# Patient Record
Sex: Female | Born: 1950 | Race: Black or African American | Hispanic: No | Marital: Married | Smoking: Never smoker
Health system: Southern US, Community
[De-identification: ages and names within clinical notes are randomized; demographics above are authoritative.]

## PROBLEM LIST (undated history)

## (undated) DIAGNOSIS — E7800 Pure hypercholesterolemia, unspecified: Secondary | ICD-10-CM

## (undated) DIAGNOSIS — K8020 Calculus of gallbladder without cholecystitis without obstruction: Secondary | ICD-10-CM

## (undated) DIAGNOSIS — T8859XA Other complications of anesthesia, initial encounter: Secondary | ICD-10-CM

## (undated) DIAGNOSIS — R7303 Prediabetes: Secondary | ICD-10-CM

## (undated) DIAGNOSIS — H04123 Dry eye syndrome of bilateral lacrimal glands: Secondary | ICD-10-CM

## (undated) DIAGNOSIS — T4145XA Adverse effect of unspecified anesthetic, initial encounter: Secondary | ICD-10-CM

## (undated) DIAGNOSIS — K219 Gastro-esophageal reflux disease without esophagitis: Secondary | ICD-10-CM

## (undated) DIAGNOSIS — R030 Elevated blood-pressure reading, without diagnosis of hypertension: Secondary | ICD-10-CM

## (undated) DIAGNOSIS — D649 Anemia, unspecified: Secondary | ICD-10-CM

## (undated) DIAGNOSIS — Z972 Presence of dental prosthetic device (complete) (partial): Secondary | ICD-10-CM

## (undated) DIAGNOSIS — T7840XA Allergy, unspecified, initial encounter: Secondary | ICD-10-CM

## (undated) DIAGNOSIS — M1990 Unspecified osteoarthritis, unspecified site: Secondary | ICD-10-CM

## (undated) HISTORY — DX: Unspecified osteoarthritis, unspecified site: M19.90

## (undated) HISTORY — DX: Pure hypercholesterolemia, unspecified: E78.00

## (undated) HISTORY — DX: Gastro-esophageal reflux disease without esophagitis: K21.9

## (undated) HISTORY — DX: Prediabetes: R73.03

## (undated) HISTORY — PX: KNEE SURGERY: SHX244

## (undated) HISTORY — DX: Allergy, unspecified, initial encounter: T78.40XA

## (undated) HISTORY — DX: Dry eye syndrome of bilateral lacrimal glands: H04.123

## (undated) HISTORY — PX: ABDOMINAL HYSTERECTOMY: SHX81

## (undated) HISTORY — DX: Calculus of gallbladder without cholecystitis without obstruction: K80.20

## (undated) HISTORY — DX: Elevated blood-pressure reading, without diagnosis of hypertension: R03.0

---

## 2003-05-13 LAB — HM PAP SMEAR

## 2004-12-18 ENCOUNTER — Emergency Department: Payer: Self-pay | Admitting: Emergency Medicine

## 2006-03-04 LAB — HM DEXA SCAN

## 2008-06-07 ENCOUNTER — Ambulatory Visit: Payer: Self-pay | Admitting: General Practice

## 2008-06-28 ENCOUNTER — Ambulatory Visit: Payer: Self-pay | Admitting: General Practice

## 2009-10-14 ENCOUNTER — Emergency Department: Payer: Self-pay | Admitting: Emergency Medicine

## 2009-12-23 ENCOUNTER — Ambulatory Visit: Payer: Self-pay | Admitting: General Practice

## 2014-11-02 DIAGNOSIS — E7800 Pure hypercholesterolemia, unspecified: Secondary | ICD-10-CM | POA: Insufficient documentation

## 2014-11-02 DIAGNOSIS — R7303 Prediabetes: Secondary | ICD-10-CM | POA: Insufficient documentation

## 2015-07-18 ENCOUNTER — Ambulatory Visit (INDEPENDENT_AMBULATORY_CARE_PROVIDER_SITE_OTHER): Payer: Commercial Managed Care - HMO | Admitting: Family Medicine

## 2015-07-18 ENCOUNTER — Encounter: Payer: Self-pay | Admitting: Family Medicine

## 2015-07-18 VITALS — BP 129/87 | HR 75 | Temp 98.2°F | Ht 66.0 in | Wt 176.0 lb

## 2015-07-18 DIAGNOSIS — R030 Elevated blood-pressure reading, without diagnosis of hypertension: Secondary | ICD-10-CM

## 2015-07-18 DIAGNOSIS — M170 Bilateral primary osteoarthritis of knee: Secondary | ICD-10-CM | POA: Diagnosis not present

## 2015-07-18 DIAGNOSIS — E7800 Pure hypercholesterolemia, unspecified: Secondary | ICD-10-CM

## 2015-07-18 DIAGNOSIS — IMO0001 Reserved for inherently not codable concepts without codable children: Secondary | ICD-10-CM

## 2015-07-18 DIAGNOSIS — M1710 Unilateral primary osteoarthritis, unspecified knee: Secondary | ICD-10-CM | POA: Insufficient documentation

## 2015-07-18 DIAGNOSIS — M179 Osteoarthritis of knee, unspecified: Secondary | ICD-10-CM | POA: Insufficient documentation

## 2015-07-18 DIAGNOSIS — R7303 Prediabetes: Secondary | ICD-10-CM

## 2015-07-18 MED ORDER — AMOXICILLIN 875 MG PO TABS: 875.0000 mg | ORAL_TABLET | Freq: Two times a day (BID) | ORAL | Status: DC

## 2015-07-18 NOTE — Patient Instructions (Addendum)
Your goal blood pressure is less than 150 mmHg on top. Try to follow the DASH guidelines (DASH stands for Dietary Approaches to Stop Hypertension) Try to limit the sodium in your diet.  Ideally, consume less than 1.5 grams (less than 1,500mg ) per day. Do not add salt when cooking or at the table.  Check the sodium amount on labels when shopping, and choose items lower in sodium when given a choice. Avoid or limit foods that already contain a lot of sodium. Eat a diet rich in fruits and vegetables and whole grains.  Limit egg yolks to three per week maximum Try to limit saturated fats in your diet (bologna, hot dogs, barbeque, cheeseburgers, hamburgers, steak, bacon, sausage, cheese, etc.) and get more fresh fruits, vegetables, and whole grains  Try turmeric as a natural anti-inflammatory (for pain and arthritis). It comes in capsules where you buy aspirin and fish oil, but also as a spice where you buy pepper and garlic powder. Consider scrambled tofu instead of eggs and turmeric gives it a nice yellow color  Return for fasting labs in the next week or two; you can also return for a Medicare Wellness visit at your convenience  I've put in a referral for you to meet with an orthopaedist about your knee; they may be able to give you injections to postpone surgery in the future If you have not heard anything from my staff in a week about any orders/referrals/studies from today, please contact us here to follow-up [phone]  Start new antibiotics Please do eat yogurt daily or take a probiotic daily for the next month or two We want to replace the healthy germs in the gut If you notice foul, watery diarrhea in the next two months, schedule an appointment RIGHT AWAY   [place] stands for "Dietary Approaches to Stop Hypertension." The DASH eating plan is a healthy eating plan that has been shown to reduce high blood pressure (hypertension). Additional health benefits may include  reducing the risk of type 2 diabetes mellitus, heart disease, and stroke. The DASH eating plan may also help with weight loss. WHAT DO I NEED TO KNOW ABOUT THE DASH EATING PLAN? For the DASH eating plan, you will follow these general guidelines:  Choose foods with a percent daily value for sodium of less than 5% (as listed on the food label).  Use salt-free seasonings or herbs instead of table salt or sea salt.  Check with your health care provider or pharmacist before using salt substitutes.  Eat lower-sodium products, often labeled as "lower sodium" or "no salt added."  Eat fresh foods.  Eat more vegetables, fruits, and low-fat dairy products.  Choose whole grains. Look for the word "whole" as the first word in the ingredient list.  Choose fish and skinless chicken or [place] more often than red meat. Limit fish, poultry, and meat to 6 oz (170 g) each day.  Limit sweets, desserts, sugars, and sugary drinks.  Choose heart-healthy fats.  Limit cheese to 1 oz (28 g) per day.  Eat more home-cooked food and less restaurant, buffet, and fast food.  Limit fried foods.  Cook foods using methods other than frying.  Limit canned vegetables. If you do use them, rinse them well to decrease the sodium.  When eating at a restaurant, ask that your food be prepared with less salt, or no salt if possible. WHAT FOODS CAN I EAT? Seek help from a dietitian for individual calorie needs. Grains Whole grain  or whole wheat bread. Brown rice. Whole grain or whole wheat pasta. Quinoa, bulgur, and whole grain cereals. Low-sodium cereals. Corn or whole wheat flour tortillas. Whole grain cornbread. Whole grain crackers. Low-sodium crackers. Vegetables Fresh or frozen vegetables (raw, steamed, roasted, or grilled). Low-sodium or reduced-sodium tomato and vegetable juices. Low-sodium or reduced-sodium tomato sauce and paste. Low-sodium or reduced-sodium canned vegetables.  Fruits All fresh, canned (in  natural juice), or frozen fruits. Meat and Other Protein Products Ground beef (85% or leaner), grass-fed beef, or beef trimmed of fat. Skinless chicken or [place]. Ground chicken or [place]. Pork trimmed of fat. All fish and seafood. Eggs. Dried beans, peas, or lentils. Unsalted nuts and seeds. Unsalted canned beans. Dairy Low-fat dairy products, such as skim or 1% milk, 2% or reduced-fat cheeses, low-fat ricotta or cottage cheese, or plain low-fat yogurt. Low-sodium or reduced-sodium cheeses. Fats and Oils Tub margarines without trans fats. Light or reduced-fat mayonnaise and salad dressings (reduced sodium). Avocado. Safflower, olive, or canola oils. Natural peanut or almond butter. Other Unsalted popcorn and pretzels. The items listed above may not be a complete list of recommended foods or beverages. Contact your dietitian for more options. WHAT FOODS ARE NOT RECOMMENDED? Grains White bread. White pasta. White rice. Refined cornbread. Bagels and croissants. Crackers that contain trans fat. Vegetables Creamed or fried vegetables. Vegetables in a cheese sauce. Regular canned vegetables. Regular canned tomato sauce and paste. Regular tomato and vegetable juices. Fruits Dried fruits. Canned fruit in light or heavy syrup. Fruit juice. Meat and Other Protein Products Fatty cuts of meat. Ribs, chicken wings, bacon, sausage, bologna, salami, chitterlings, fatback, hot dogs, bratwurst, and packaged luncheon meats. Salted nuts and seeds. Canned beans with salt. Dairy Whole or 2% milk, cream, half-and-half, and cream cheese. Whole-fat or sweetened yogurt. Full-fat cheeses or blue cheese. Nondairy creamers and whipped toppings. Processed cheese, cheese spreads, or cheese curds. Condiments Onion and garlic salt, seasoned salt, table salt, and sea salt. Canned and packaged gravies. Worcestershire sauce. Tartar sauce. Barbecue sauce. Teriyaki sauce. Soy sauce, including reduced sodium. Steak sauce. Fish  sauce. Oyster sauce. Cocktail sauce. Horseradish. Ketchup and mustard. Meat flavorings and tenderizers. Bouillon cubes. Hot sauce. Tabasco sauce. Marinades. Taco seasonings. Relishes. Fats and Oils Butter, stick margarine, lard, shortening, ghee, and bacon fat. Coconut, palm kernel, or palm oils. Regular salad dressings. Other Pickles and olives. Salted popcorn and pretzels. The items listed above may not be a complete list of foods and beverages to avoid. Contact your dietitian for more information. WHERE CAN I FIND MORE INFORMATION? National Heart, [employer], and [employer] Institute: travelstabloid.com   This information is not intended to replace advice given to you by your health care provider. Make sure you discuss any questions you have with your health care provider.   Document Released: 04/11/2011 Document Revised: 05/13/2014 Document Reviewed: 02/24/2013 Elsevier Interactive Patient Education [employer].
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
## 2015-07-25 ENCOUNTER — Other Ambulatory Visit: Payer: Commercial Managed Care - HMO

## 2015-07-25 DIAGNOSIS — Z114 Encounter for screening for human immunodeficiency virus [HIV]: Secondary | ICD-10-CM

## 2015-07-25 DIAGNOSIS — R7303 Prediabetes: Secondary | ICD-10-CM | POA: Diagnosis not present

## 2015-07-25 DIAGNOSIS — Z1159 Encounter for screening for other viral diseases: Secondary | ICD-10-CM

## 2015-07-25 DIAGNOSIS — E7800 Pure hypercholesterolemia, unspecified: Secondary | ICD-10-CM | POA: Diagnosis not present

## 2015-07-25 DIAGNOSIS — E538 Deficiency of other specified B group vitamins: Secondary | ICD-10-CM | POA: Diagnosis not present

## 2015-07-25 NOTE — Assessment & Plan Note (Signed)
Labs

## 2015-07-25 NOTE — Assessment & Plan Note (Signed)
labs

## 2015-07-28 ENCOUNTER — Telehealth: Payer: Self-pay | Admitting: Family Medicine

## 2015-07-28 LAB — COMPREHENSIVE METABOLIC PANEL
ALK PHOS: 80 IU/L (ref 39–117)
ALT: 22 IU/L (ref 0–32)
AST: 31 IU/L (ref 0–40)
Albumin/Globulin Ratio: 1.4 (ref 1.2–2.2)
Albumin: 4.1 g/dL (ref 3.6–4.8)
BUN / CREAT RATIO: 19 (ref 11–26)
BUN: 14 mg/dL (ref 8–27)
CHLORIDE: 104 mmol/L (ref 96–106)
CO2: 24 mmol/L (ref 18–29)
CREATININE: 0.72 mg/dL (ref 0.57–1.00)
Calcium: 9.2 mg/dL (ref 8.7–10.3)
GFR calc Af Amer: 102 mL/min/{1.73_m2} (ref >59)
GFR calc non Af Amer: 88 mL/min/{1.73_m2} (ref >59)
GLUCOSE: 98 mg/dL (ref 65–99)
Globulin, Total: 2.9 g/dL (ref 1.5–4.5)
Potassium: 4.6 mmol/L (ref 3.5–5.2)
Sodium: 144 mmol/L (ref 134–144)
Total Protein: 7 g/dL (ref 6.0–8.5)

## 2015-07-28 LAB — CBC WITH DIFFERENTIAL/PLATELET
Basophils Absolute: 0 10*3/uL (ref 0.0–0.2)
Basos: 1 %
EOS (ABSOLUTE): 0 10*3/uL (ref 0.0–0.4)
EOS: 0 %
HEMATOCRIT: 41.4 % (ref 34.0–46.6)
HEMOGLOBIN: 13.8 g/dL (ref 11.1–15.9)
Immature Grans (Abs): 0 10*3/uL (ref 0.0–0.1)
Immature Granulocytes: 0 %
LYMPHS ABS: 0.8 10*3/uL (ref 0.7–3.1)
Lymphs: 24 %
MCH: 28.6 pg (ref 26.6–33.0)
MCHC: 33.3 g/dL (ref 31.5–35.7)
MCV: 86 fL (ref 79–97)
MONOS ABS: 0.7 10*3/uL (ref 0.1–0.9)
Monocytes: 21 %
NEUTROS ABS: 1.8 10*3/uL (ref 1.4–7.0)
NEUTROS PCT: 54 %
Platelets: 190 10*3/uL (ref 150–379)
RBC: 4.83 x10E6/uL (ref 3.77–5.28)
RDW: 13.9 % (ref 12.3–15.4)
WBC: 3.4 10*3/uL (ref 3.4–10.8)

## 2015-07-28 LAB — HEPATITIS C ANTIBODY: HCV AB: 0.1 {s_co_ratio} (ref 0.0–0.9)

## 2015-07-28 LAB — LIPID PANEL W/O CHOL/HDL RATIO
Cholesterol, Total: 227 mg/dL — ABNORMAL HIGH (ref 100–199)
HDL: 88 mg/dL (ref >39)
LDL CALC: 132 mg/dL — AB (ref 0–99)
Triglycerides: 37 mg/dL (ref 0–149)
VLDL CHOLESTEROL CAL: 7 mg/dL (ref 5–40)

## 2015-07-28 LAB — HIV ANTIBODY (ROUTINE TESTING W REFLEX): HIV SCREEN 4TH GENERATION: NONREACTIVE

## 2015-07-28 LAB — VITAMIN B12: VITAMIN B 12: 1329 pg/mL — AB (ref 211–946)

## 2015-07-28 MED ORDER — AMOXICILLIN-POT CLAVULANATE 875-125 MG PO TABS: 1.0000 | ORAL_TABLET | Freq: Two times a day (BID) | ORAL | Status: DC
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
## 2015-07-28 NOTE — Telephone Encounter (Signed)
Routing to provider. Is it ok if I call it in to her pharmacy?

## 2015-08-01 DIAGNOSIS — R030 Elevated blood-pressure reading, without diagnosis of hypertension: Secondary | ICD-10-CM

## 2015-08-01 DIAGNOSIS — IMO0001 Reserved for inherently not codable concepts without codable children: Secondary | ICD-10-CM | POA: Insufficient documentation

## 2015-08-01 NOTE — Assessment & Plan Note (Signed)
Return for fasting labs to include cholesterol panel; limit saturated fats

## 2015-08-01 NOTE — Assessment & Plan Note (Signed)
Check labs, work on healthy eating, weight management

## 2015-08-01 NOTE — Assessment & Plan Note (Signed)
Noted at check-in; recheck was normal; recommended DASH guidelines; cut back on sodium in particular

## 2015-08-01 NOTE — Assessment & Plan Note (Signed)
Refer to orthopaedist for evaluation; right worse than left

## 2015-10-09 ENCOUNTER — Emergency Department: Payer: Commercial Managed Care - HMO

## 2015-10-09 ENCOUNTER — Emergency Department
Admission: EM | Admit: 2015-10-09 | Discharge: 2015-10-09 | Disposition: A | Discharge status: Home / Self Care | Payer: Commercial Managed Care - HMO | Attending: Emergency Medicine | Admitting: Emergency Medicine

## 2015-10-09 ENCOUNTER — Encounter: Payer: Self-pay | Admitting: Emergency Medicine

## 2015-10-09 DIAGNOSIS — R079 Chest pain, unspecified: Secondary | ICD-10-CM | POA: Diagnosis not present

## 2015-10-09 DIAGNOSIS — M179 Osteoarthritis of knee, unspecified: Secondary | ICD-10-CM | POA: Diagnosis not present

## 2015-10-09 DIAGNOSIS — K219 Gastro-esophageal reflux disease without esophagitis: Secondary | ICD-10-CM | POA: Diagnosis not present

## 2015-10-09 LAB — CBC
HEMATOCRIT: 39.2 % (ref 35.0–47.0)
HEMOGLOBIN: 12.9 g/dL (ref 12.0–16.0)
MCH: 28.1 pg (ref 26.0–34.0)
MCHC: 32.8 g/dL (ref 32.0–36.0)
MCV: 85.5 fL (ref 80.0–100.0)
Platelets: 188 10*3/uL (ref 150–440)
RBC: 4.58 MIL/uL (ref 3.80–5.20)
RDW: 13.7 % (ref 11.5–14.5)
WBC: 6 10*3/uL (ref 3.6–11.0)

## 2015-10-09 LAB — BASIC METABOLIC PANEL
ANION GAP: 9 (ref 5–15)
BUN: 12 mg/dL (ref 6–20)
CALCIUM: 9 mg/dL (ref 8.9–10.3)
CO2: 25 mmol/L (ref 22–32)
Chloride: 103 mmol/L (ref 101–111)
Creatinine, Ser: 0.65 mg/dL (ref 0.44–1.00)
GFR calc Af Amer: >60 mL/min (ref >60)
GLUCOSE: 145 mg/dL — AB (ref 65–99)
POTASSIUM: 3.6 mmol/L (ref 3.5–5.1)
SODIUM: 137 mmol/L (ref 135–145)

## 2015-10-09 LAB — TROPONIN I

## 2015-10-09 MED ORDER — FAMOTIDINE 20 MG PO TABS: 20.0000 mg | ORAL_TABLET | Freq: Two times a day (BID) | ORAL | Status: DC

## 2015-10-09 MED ORDER — FAMOTIDINE 20 MG PO TABS
20.0000 mg | ORAL_TABLET | Freq: Once | ORAL | Status: AC
  Administered 2015-10-09: 20 mg via ORAL
  Filled 2015-10-09: qty 1

## 2015-10-09 MED ORDER — GI COCKTAIL ~~LOC~~
30.0000 mL | Freq: Once | ORAL | Status: AC
  Administered 2015-10-09: 30 mL via ORAL
  Filled 2015-10-09: qty 30

## 2015-10-09 NOTE — Discharge Instructions (Signed)
Gastroesophageal Reflux Disease, Adult Normally, food travels down the esophagus and stays in the stomach to be digested. However, when a person has gastroesophageal reflux disease (GERD), food and stomach acid move back up into the esophagus. When this happens, the esophagus becomes sore and inflamed. Over time, GERD can create small holes (ulcers) in the lining of the esophagus.  CAUSES This condition is caused by a problem with the muscle between the esophagus and the stomach (lower esophageal sphincter, or LES). Normally, the LES muscle closes after food passes through the esophagus to the stomach. When the LES is weakened or abnormal, it does not close properly, and that allows food and stomach acid to go back up into the esophagus. The LES can be weakened by certain dietary substances, medicines, and medical conditions, including:  Tobacco use.  Pregnancy.  Having a hiatal hernia.  Heavy alcohol use.  Certain foods and beverages, such as coffee, chocolate, onions, and peppermint. RISK FACTORS This condition is more likely to develop in:  People who have an increased body weight.  People who have connective tissue disorders.  People who use NSAID medicines. SYMPTOMS Symptoms of this condition include:  Heartburn.  Difficult or painful swallowing.  The feeling of having a lump in the throat.  Abitter taste in the mouth.  Bad breath.  Having a large amount of saliva.  Having an upset or bloated stomach.  Belching.  Chest pain.  Shortness of breath or wheezing.  Ongoing (chronic) cough or a night-time cough.  Wearing away of tooth enamel.  Weight loss. Different conditions can cause chest pain. Make sure to see your health care provider if you experience chest pain. DIAGNOSIS Your health care provider will take a medical history and perform a physical exam. To determine if you have mild or severe GERD, your health care provider may also monitor how you respond  to treatment. You may also have other tests, including:  An endoscopy toexamine your stomach and esophagus with a small camera.  A test thatmeasures the acidity level in your esophagus.  A test thatmeasures how much pressure is on your esophagus.  A barium swallow or modified barium swallow to show the shape, size, and functioning of your esophagus. TREATMENT The goal of treatment is to help relieve your symptoms and to prevent complications. Treatment for this condition may vary depending on how severe your symptoms are. Your health care provider may recommend:  Changes to your diet.  Medicine.  Surgery. HOME CARE INSTRUCTIONS Diet  Follow a diet as recommended by your health care provider. This may involve avoiding foods and drinks such as:  Coffee and tea (with or without caffeine).  Drinks that containalcohol.  Energy drinks and sports drinks.  Carbonated drinks or sodas.  Chocolate and cocoa.  Peppermint and mint flavorings.  Garlic and onions.  Horseradish.  Spicy and acidic foods, including peppers, chili powder, curry powder, vinegar, hot sauces, and barbecue sauce.  Citrus fruit juices and citrus fruits, such as oranges, lemons, and limes.  Tomato-based foods, such as red sauce, chili, salsa, and pizza with red sauce.  Fried and fatty foods, such as donuts, french fries, potato chips, and high-fat dressings.  High-fat meats, such as hot dogs and fatty cuts of red and white meats, such as rib eye steak, sausage, ham, and bacon.  High-fat dairy items, such as whole milk, butter, and cream cheese.  Eat small, frequent meals instead of large meals.  Avoid drinking large amounts of liquid with your  meals. °· Avoid eating meals during the 2-3 hours before bedtime. °· Avoid lying down right after you eat. °· Do not exercise right after you eat. ° General Instructions  °· Pay attention to any changes in your symptoms. °· Take over-the-counter and prescription  medicines only as told by your health care provider. Do not take aspirin, ibuprofen, or other NSAIDs unless your health care provider told you to do so. °· Do not use any tobacco products, including cigarettes, chewing tobacco, and e-cigarettes. If you need help quitting, ask your health care provider. °· Wear loose-fitting clothing. Do not wear anything tight around your waist that causes pressure on your abdomen. °· Raise (elevate) the head of your bed 6 inches (15cm). °· Try to reduce your stress, such as with yoga or meditation. If you need help reducing stress, ask your health care provider. °· If you are overweight, reduce your weight to an amount that is healthy for you. Ask your health care provider for guidance about a safe weight loss goal. °· Keep all follow-up visits as told by your health care provider. This is important. °SEEK MEDICAL CARE IF: °· You have new symptoms. °· You have unexplained weight loss. °· You have difficulty swallowing, or it hurts to swallow. °· You have wheezing or a persistent cough. °· Your symptoms do not improve with treatment. °· You have a hoarse voice. °SEEK IMMEDIATE MEDICAL CARE IF: °· You have pain in your arms, neck, jaw, teeth, or back. °· You feel sweaty, dizzy, or light-headed. °· You have chest pain or shortness of breath. °· You vomit and your vomit looks like blood or coffee grounds. °· You faint. °· Your stool is bloody or black. °· You cannot swallow, drink, or eat. °  °This information is not intended to replace advice given to you by your health care provider. Make sure you discuss any questions you have with your health care provider. °  °Document Released: 01/30/2005 Document Revised: 01/11/2015 Document Reviewed: 08/17/2014 °Elsevier Interactive Patient Education ©2016 [employer] °Nonspecific Chest Pain  °Chest pain can be caused by many different conditions. There is always a chance that your pain could be related to something serious, such as a heart  attack or a blood clot in your lungs. Chest pain can also be caused by conditions that are not life-threatening. If you have chest pain, it is very important to follow up with your health care provider. °CAUSES  °Chest pain can be caused by: °· Heartburn. °· Pneumonia or bronchitis. °· Anxiety or stress. °· Inflammation around your heart (pericarditis) or lung (pleuritis or pleurisy). °· A blood clot in your lung. °· A collapsed lung (pneumothorax). It can develop suddenly on its own (spontaneous pneumothorax) or from trauma to the chest. °· Shingles infection (varicella-zoster virus). °· Heart attack. °· Damage to the bones, muscles, and cartilage that make up your chest wall. This can include: °· Bruised bones due to injury. °· Strained muscles or cartilage due to frequent or repeated coughing or overwork. °· Fracture to one or more ribs. °· Sore cartilage due to inflammation (costochondritis). °RISK FACTORS  °Risk factors for chest pain may include: °· Activities that increase your risk for trauma or injury to your chest. °· Respiratory infections or conditions that cause frequent coughing. °· Medical conditions or overeating that can cause heartburn. °· Heart disease or family history of heart disease. °· Conditions or health behaviors that increase your risk of developing a blood clot. °· Having had chicken pox (  varicella zoster). °SIGNS AND SYMPTOMS °Chest pain can feel like: °· Burning or tingling on the surface of your chest or deep in your chest. °· Crushing, pressure, aching, or squeezing pain. °· Dull or sharp pain that is worse when you move, cough, or take a deep breath. °· Pain that is also felt in your back, neck, shoulder, or arm, or pain that spreads to any of these areas. °Your chest pain may come and go, or it may stay constant. °DIAGNOSIS °Lab tests or other studies may be needed to find the cause of your pain. Your health care provider may have you take a test called an ambulatory ECG  (electrocardiogram). An ECG records your heartbeat patterns at the time the test is performed. You may also have other tests, such as: °· Transthoracic echocardiogram (TTE). During echocardiography, sound waves are used to create a picture of all of the heart structures and to look at how blood flows through your heart. °· Transesophageal echocardiogram (TEE). This is a more advanced imaging test that obtains images from inside your body. It allows your health care provider to see your heart in finer detail. °· Cardiac monitoring. This allows your health care provider to monitor your heart rate and rhythm in real time. °· Holter monitor. This is a portable device that records your heartbeat and can help to diagnose abnormal heartbeats. It allows your health care provider to track your heart activity for several days, if needed. °· Stress tests. These can be done through exercise or by taking medicine that makes your heart beat more quickly. °· Blood tests. °· Imaging tests. °TREATMENT  °Your treatment depends on what is causing your chest pain. Treatment may include: °· Medicines. These may include: °· Acid blockers for heartburn. °· Anti-inflammatory medicine. °· Pain medicine for inflammatory conditions. °· Antibiotic medicine, if an infection is present. °· Medicines to dissolve blood clots. °· Medicines to treat coronary artery disease. °· Supportive care for conditions that do not require medicines. This may include: °· Resting. °· Applying heat or cold packs to injured areas. °· Limiting activities until pain decreases. °HOME CARE INSTRUCTIONS °· If you were prescribed an antibiotic medicine, finish it all even if you start to feel better. °· Avoid any activities that bring on chest pain. °· Do not use any tobacco products, including cigarettes, chewing tobacco, or electronic cigarettes. If you need help quitting, ask your health care provider. °· Do not drink alcohol. °· Take medicines only as directed by  your health care provider. °· Keep all follow-up visits as directed by your health care provider. This is important. This includes any further testing if your chest pain does not go away. °· If heartburn is the cause for your chest pain, you may be told to keep your head raised (elevated) while sleeping. This reduces the chance that acid will go from your stomach into your esophagus. °· Make lifestyle changes as directed by your health care provider. These may include: °¨ Getting regular exercise. Ask your health care provider to suggest some activities that are safe for you. °¨ Eating a heart-healthy diet. A registered dietitian can help you to learn healthy eating options. °¨ Maintaining a healthy weight. °¨ Managing diabetes, if necessary. °¨ Reducing stress. °SEEK MEDICAL CARE IF: °· Your chest pain does not go away after treatment. °· You have a rash with blisters on your chest. °· You have a fever. °SEEK IMMEDIATE MEDICAL CARE IF:  °· Your chest pain is worse. °· You   have an increasing cough, or you cough up blood. °· You have severe abdominal pain. °· You have severe weakness. °· You faint. °· You have chills. °· You have sudden, unexplained chest discomfort. °· You have sudden, unexplained discomfort in your arms, back, neck, or jaw. °· You have shortness of breath at any time. °· You suddenly start to sweat, or your skin gets clammy. °· You feel nauseous or you vomit. °· You suddenly feel light-headed or dizzy. °· Your heart begins to beat quickly, or it feels like it is skipping beats. °These symptoms may represent a serious problem that is an emergency. Do not wait to see if the symptoms will go away. Get medical help right away. Call your local emergency services (911 in the [place] Do not drive yourself to the hospital. °  °This information is not intended to replace advice given to you by your health care provider. Make sure you discuss any questions you have with your health care provider. °  °Document  Released: 01/30/2005 Document Revised: 05/13/2014 Document Reviewed: 11/26/2013 °Elsevier Interactive Patient Education ©2016 [employer]vier Inc. ° °

## 2015-10-09 NOTE — ED Provider Notes (Addendum)
[facility] Emergency Department Provider Note        Time seen: ----------------------------------------- 9:54 PM on 10/09/2015 -----------------------------------------    I have reviewed the triage vital signs and the nursing notes.   HISTORY  Chief Complaint Chest Pain    HPI Ebony [name] is a 65 y.o. female who presents to ER with upper chest pain radiating into her throat accompanied by nausea.Patient states she thought it was acid indigestion, symptoms started about 5:00. She states she still has some soreness, had some sweats with it as well. Currently the symptoms are mild, she denies any recent illness. She has never had this happen before.   Past Medical History  Diagnosis Date  . Hypercholesterolemia   . Borderline diabetes mellitus   . Arthritis     Patient Active Problem List   Diagnosis Date Noted  . Elevated blood pressure 08/01/2015  . Arthritis of knee, degenerative 07/18/2015  . Borderline diabetes mellitus 11/02/2014  . Pure hypercholesterolemia 11/02/2014    Past Surgical History  Procedure Laterality Date  . Abdominal hysterectomy      complete  . Knee surgery Bilateral     Allergies Celecoxib  Social History Social History  Substance Use Topics  . Smoking status: Never Smoker   . Smokeless tobacco: Never Used  . Alcohol Use: No    Review of Systems Constitutional: Negative for fever. Eyes: Negative for visual changes. ENT: Negative for sore throat. Cardiovascular: Positive for chest pain Respiratory: Negative for shortness of breath. Gastrointestinal: Negative for abdominal pain, positive for nausea Genitourinary: Negative for dysuria. Musculoskeletal: Negative for back pain. Skin: Negative for rash. Neurological: Negative for headaches, focal weakness or numbness.  10-point ROS otherwise negative.  ____________________________________________   PHYSICAL EXAM:  VITAL SIGNS: ED Triage Vitals   Enc Vitals Group     BP 10/09/15 2020 139/92 mmHg     Pulse Rate 10/09/15 2020 82     Resp 10/09/15 2020 20     Temp 10/09/15 2020 97.9 F (36.6 C)     Temp Source 10/09/15 2020 Oral     SpO2 10/09/15 2020 99 %     Weight 10/09/15 2020 170 lb (77.111 kg)     Height 10/09/15 2020 5\' 7"  (1.702 m)     Head Cir --      Peak Flow --      Pain Score 10/09/15 2019 7     Pain Loc --      Pain Edu? --      Excl. in [place]? --     Constitutional: Alert and oriented. Well appearing and in no distress. Eyes: Conjunctivae are normal. PERRL. Normal extraocular movements. ENT   Head: Normocephalic and atraumatic.   Nose: No congestion/rhinnorhea.   Mouth/Throat: Mucous membranes are moist.   Neck: No stridor. Cardiovascular: Normal rate, regular rhythm. No murmurs, rubs, or gallops. Respiratory: Normal respiratory effort without tachypnea nor retractions. Breath sounds are clear and equal bilaterally. No wheezes/rales/rhonchi. Gastrointestinal: Soft and nontender. Normal bowel sounds Musculoskeletal: Nontender with normal range of motion in all extremities. No lower extremity tenderness nor edema. Neurologic:  Normal speech and language. No gross focal neurologic deficits are appreciated.  Skin:  Skin is warm, dry and intact. No rash noted. Psychiatric: Mood and affect are normal. Speech and behavior are normal.  ____________________________________________  EKG: Interpreted by me.Normal sinus rhythm rate 84 bpm, normal PR interval, normal QRS, normal QT interval. Normal axis.  ____________________________________________  ED COURSE:  Pertinent labs &  imaging results that were available during my care of the patient were reviewed by me and considered in my medical decision making (see chart for details). Patient is in no acute distress, appears very well. I will check basic labs and possible repeat troponin. ____________________________________________    LABS (pertinent  positives/negatives)  Labs Reviewed  BASIC METABOLIC PANEL - Abnormal; Notable for the following:    Glucose, Bld 145 (*)    All other components within normal limits  CBC  TROPONIN I  TROPONIN I    RADIOLOGY Images were viewed by me  IMPRESSION: Mild opacity in the left lung base is favored to represent scar or atelectasis. Early infiltrate not completely excluded. Recommend follow-up to resolution.  ____________________________________________  FINAL ASSESSMENT AND PLAN  Chest pain  Plan: Patient with labs and imaging as dictated above. Patient was given a GI cocktail and Pepcid.Patient likely with some GERD symptoms. She'll be discharged with Pepcid and encouraged to have close follow-up with her doctor for reevaluation.   Earleen Newport, MD   Note: This dictation was prepared with Dragon dictation. Any transcriptional errors that result from this process are unintentional   Earleen Newport, MD 10/09/15 2308  Earleen Newport, MD 10/09/15 [phone]

## 2015-10-09 NOTE — ED Notes (Signed)
Pt to triage via w/c with no distress noted; pt reports mid upper CP radiating into throat accomp by nausea x 1-2 hrs; denies hx of same

## 2015-10-09 NOTE — ED Notes (Signed)
Discharge instructions reviewed with patient. Questions fielded by this RN. Patient verbalizes understanding of instructions. Patient discharged home in stable condition per Jimmye Norman MD . No acute distress noted at time of discharge.

## 2015-10-11 ENCOUNTER — Encounter: Payer: Self-pay | Admitting: Family Medicine

## 2015-10-11 ENCOUNTER — Other Ambulatory Visit: Payer: Self-pay | Admitting: Family Medicine

## 2015-10-11 ENCOUNTER — Ambulatory Visit (INDEPENDENT_AMBULATORY_CARE_PROVIDER_SITE_OTHER): Payer: Commercial Managed Care - HMO | Admitting: Family Medicine

## 2015-10-11 VITALS — BP 122/74 | HR 85 | Temp 97.7°F | Resp 14 | Wt 175.5 lb

## 2015-10-11 DIAGNOSIS — F4323 Adjustment disorder with mixed anxiety and depressed mood: Secondary | ICD-10-CM

## 2015-10-11 DIAGNOSIS — N390 Urinary tract infection, site not specified: Secondary | ICD-10-CM | POA: Diagnosis not present

## 2015-10-11 DIAGNOSIS — M170 Bilateral primary osteoarthritis of knee: Secondary | ICD-10-CM | POA: Diagnosis not present

## 2015-10-11 DIAGNOSIS — R12 Heartburn: Secondary | ICD-10-CM | POA: Insufficient documentation

## 2015-10-11 DIAGNOSIS — R8281 Pyuria: Secondary | ICD-10-CM

## 2015-10-11 DIAGNOSIS — R350 Frequency of micturition: Secondary | ICD-10-CM

## 2015-10-11 HISTORY — DX: Adjustment disorder with mixed anxiety and depressed mood: F43.23

## 2015-10-11 LAB — POCT URINALYSIS DIPSTICK
BILIRUBIN UA: NEGATIVE
GLUCOSE UA: NEGATIVE
Ketones, UA: NEGATIVE
NITRITE UA: NEGATIVE
Protein, UA: NEGATIVE
RBC UA: NEGATIVE
Spec Grav, UA: 1.015
Urobilinogen, UA: 0.2
pH, UA: 5.5

## 2015-10-11 MED ORDER — NITROFURANTOIN MONOHYD MACRO 100 MG PO CAPS: 100.0000 mg | ORAL_CAPSULE | Freq: Two times a day (BID) | ORAL | Status: AC

## 2015-10-11 MED ORDER — ESCITALOPRAM OXALATE 10 MG PO TABS: 10.0000 mg | ORAL_TABLET | Freq: Every day | ORAL | Status: DC

## 2015-10-11 NOTE — Patient Instructions (Addendum)
Continue the famotidine (Pepcid) Try to limit triggers foods and drinks for acid: tomato-based sauces, orange juice, peppermint, coffee, onions, peppers, soft drinks Start escitalopram daily for stress Try to drink 64 ounces of water or decaf beverages a day If you get worse, call me or seek medical attention Please do eat yogurt daily or take a probiotic daily for the next month or two We want to replace the healthy germs in the gut If you notice foul, watery diarrhea in the next two months, schedule an appointment RIGHT AWAY
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
## 2015-10-11 NOTE — Assessment & Plan Note (Signed)
She is taking curcumin

## 2015-10-11 NOTE — Assessment & Plan Note (Signed)
Start escitalopram; discussed stress, prayer given; will see her back in 4 weeks

## 2015-10-11 NOTE — Assessment & Plan Note (Signed)
Continue pepcid; avoid/limit trigger foods; work on stress reduction

## 2015-10-13 LAB — PLEASE NOTE

## 2015-10-13 LAB — URINE CULTURE: ORGANISM ID, BACTERIA: NO GROWTH

## 2016-10-21 ENCOUNTER — Ambulatory Visit: Payer: Commercial Managed Care - HMO | Admitting: Family Medicine

## 2016-12-17 ENCOUNTER — Ambulatory Visit (INDEPENDENT_AMBULATORY_CARE_PROVIDER_SITE_OTHER): Payer: Medicare HMO | Admitting: Family Medicine

## 2016-12-17 ENCOUNTER — Encounter: Payer: Self-pay | Admitting: Family Medicine

## 2016-12-17 VITALS — BP 136/82 | HR 82 | Temp 97.6°F | Resp 16 | Wt 154.8 lb

## 2016-12-17 DIAGNOSIS — R7303 Prediabetes: Secondary | ICD-10-CM | POA: Diagnosis not present

## 2016-12-17 DIAGNOSIS — R634 Abnormal weight loss: Secondary | ICD-10-CM | POA: Diagnosis not present

## 2016-12-17 DIAGNOSIS — Z1231 Encounter for screening mammogram for malignant neoplasm of breast: Secondary | ICD-10-CM

## 2016-12-17 DIAGNOSIS — E7800 Pure hypercholesterolemia, unspecified: Secondary | ICD-10-CM

## 2016-12-17 DIAGNOSIS — Z5181 Encounter for therapeutic drug level monitoring: Secondary | ICD-10-CM | POA: Diagnosis not present

## 2016-12-17 DIAGNOSIS — R030 Elevated blood-pressure reading, without diagnosis of hypertension: Secondary | ICD-10-CM

## 2016-12-17 DIAGNOSIS — Z1239 Encounter for other screening for malignant neoplasm of breast: Secondary | ICD-10-CM

## 2016-12-17 HISTORY — DX: Abnormal weight loss: R63.4

## 2016-12-17 HISTORY — DX: Elevated blood-pressure reading, without diagnosis of hypertension: R03.0

## 2016-12-17 LAB — CBC WITH DIFFERENTIAL/PLATELET
BASOS PCT: 1 %
Basophils Absolute: 33 cells/uL (ref 0–200)
EOS PCT: 0 %
Eosinophils Absolute: 0 cells/uL — ABNORMAL LOW (ref 15–500)
HCT: 40.6 % (ref 35.0–45.0)
Hemoglobin: 13.5 g/dL (ref 11.7–15.5)
LYMPHS PCT: 40 %
Lymphs Abs: 1320 cells/uL (ref 850–3900)
MCH: 28.5 pg (ref 27.0–33.0)
MCHC: 33.3 g/dL (ref 32.0–36.0)
MCV: 85.7 fL (ref 80.0–100.0)
MONO ABS: 396 {cells}/uL (ref 200–950)
MONOS PCT: 12 %
MPV: 10.1 fL (ref 7.5–12.5)
NEUTROS PCT: 47 %
Neutro Abs: 1551 cells/uL (ref 1500–7800)
PLATELETS: 222 10*3/uL (ref 140–400)
RBC: 4.74 MIL/uL (ref 3.80–5.10)
RDW: 14.3 % (ref 11.0–15.0)
WBC: 3.3 10*3/uL — AB (ref 3.8–10.8)

## 2016-12-17 MED ORDER — FAMOTIDINE 20 MG PO TABS: 20.0000 mg | ORAL_TABLET | Freq: Two times a day (BID) | ORAL | 1 refills | Status: DC

## 2016-12-17 NOTE — Assessment & Plan Note (Signed)
Likely related to PO intake, but let's check TSH today (runs in the family)

## 2016-12-17 NOTE — Patient Instructions (Signed)
Return for a Medicare Wellness visit in the next few months We'll contact you about the labs Please do call to schedule your mammogram; the number to schedule one at either [facility] or [facility] is [phone]

## 2016-12-17 NOTE — Assessment & Plan Note (Signed)
Encouragement given; check creatinine; try DASH guidelines
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
## 2016-12-17 NOTE — Assessment & Plan Note (Signed)
Check lipids today; cutting down on fatty meats

## 2016-12-17 NOTE — Assessment & Plan Note (Signed)
Check labs 

## 2016-12-17 NOTE — Assessment & Plan Note (Signed)
Check glucose and A1c; taking cinnamon for blood sugar control

## 2016-12-18 ENCOUNTER — Encounter: Payer: Self-pay | Admitting: Family Medicine

## 2016-12-18 DIAGNOSIS — D709 Neutropenia, unspecified: Secondary | ICD-10-CM | POA: Insufficient documentation

## 2016-12-18 LAB — COMPLETE METABOLIC PANEL WITH GFR
ALT: 11 U/L (ref 6–29)
AST: 18 U/L (ref 10–35)
Albumin: 3.9 g/dL (ref 3.6–5.1)
Alkaline Phosphatase: 79 U/L (ref 33–130)
BILIRUBIN TOTAL: 0.7 mg/dL (ref 0.2–1.2)
BUN: 15 mg/dL (ref 7–25)
CALCIUM: 9.3 mg/dL (ref 8.6–10.4)
CO2: 27 mmol/L (ref 20–32)
Chloride: 106 mmol/L (ref 98–110)
Creat: 0.65 mg/dL (ref 0.50–0.99)
Glucose, Bld: 104 mg/dL — ABNORMAL HIGH (ref 65–99)
Potassium: 4.8 mmol/L (ref 3.5–5.3)
Sodium: 141 mmol/L (ref 135–146)
Total Protein: 6.8 g/dL (ref 6.1–8.1)

## 2016-12-18 LAB — LIPID PANEL
CHOL/HDL RATIO: 2.6 ratio (ref <5.0)
Cholesterol: 236 mg/dL — ABNORMAL HIGH (ref <200)
HDL: 91 mg/dL (ref >50)
LDL Cholesterol: 130 mg/dL — ABNORMAL HIGH (ref <100)
Triglycerides: 73 mg/dL (ref <150)
VLDL: 15 mg/dL (ref <30)

## 2016-12-18 LAB — TSH: TSH: 0.7 mIU/L

## 2016-12-18 LAB — HEMOGLOBIN A1C
HEMOGLOBIN A1C: 5.8 % — AB (ref <5.7)
MEAN PLASMA GLUCOSE: 120 mg/dL

## 2016-12-18 LAB — MAGNESIUM: MAGNESIUM: 2 mg/dL (ref 1.5–2.5)

## 2017-01-02 ENCOUNTER — Telehealth: Payer: Self-pay | Admitting: Family Medicine

## 2017-01-02 MED ORDER — ESCITALOPRAM OXALATE 10 MG PO TABS: 10.0000 mg | ORAL_TABLET | Freq: Every day | ORAL | 2 refills | Status: DC

## 2017-01-02 NOTE — Telephone Encounter (Signed)
I called patient; her sister just got home from [place]; mother has things going on; used to take lexapro and it did okay; she says it really helped; no thoughts of SI/HI; please seek help if any issues; please check in with Roselyn Reef or Amber in a few weeks with update

## 2017-01-02 NOTE — Telephone Encounter (Signed)
Requesting return call [phone]

## 2017-01-02 NOTE — Telephone Encounter (Signed)
Patient called states she was once on escitalopram.  She is having a lot going on in here life right know.  Her mom is sick, her sister is in the hospital and she has got jury duty.  Wants to see if she can get re prescribed?

## 2017-02-04 ENCOUNTER — Ambulatory Visit (INDEPENDENT_AMBULATORY_CARE_PROVIDER_SITE_OTHER): Payer: Medicare HMO | Admitting: Family Medicine

## 2017-02-04 ENCOUNTER — Encounter: Payer: Self-pay | Admitting: Family Medicine

## 2017-02-04 VITALS — BP 120/76 | HR 76 | Temp 98.1°F | Resp 14 | Wt 154.3 lb

## 2017-02-04 DIAGNOSIS — Z0000 Encounter for general adult medical examination without abnormal findings: Secondary | ICD-10-CM | POA: Insufficient documentation

## 2017-02-04 DIAGNOSIS — Z789 Other specified health status: Secondary | ICD-10-CM

## 2017-02-04 DIAGNOSIS — Z780 Asymptomatic menopausal state: Secondary | ICD-10-CM | POA: Diagnosis not present

## 2017-02-04 DIAGNOSIS — Z1211 Encounter for screening for malignant neoplasm of colon: Secondary | ICD-10-CM

## 2017-02-04 NOTE — Progress Notes (Signed)
Patient: Ebony [name], Female    DOB: [birth year], 66 y.o.   MRN: [id number]  Visit Date: 02/04/2017  Today's Provider: Enid Derry, MD   Chief Complaint  Patient presents with  . Medicare Wellness    Subjective:   Ebony [name] is a 66 y.o. female who presents today for her Subsequent Annual Wellness Visit.  Caregiver input:  N/a  USPSTF grade A and B recommendations Depression:  Depression screen [facility] 2/9 02/04/2017 12/17/2016 10/11/2015  Decreased Interest 0 0 0  Down, Depressed, Hopeless 0 0 1  PHQ - 2 Score 0 0 1   Hypertension: BP Readings from Last 3 Encounters:  02/04/17 120/76  12/17/16 136/82  10/11/15 122/74   Obesity: Wt Readings from Last 3 Encounters:  02/04/17 154 lb 4.8 oz (70 kg)  12/17/16 154 lb 12.8 oz (70.2 kg)  10/11/15 175 lb 8 oz (79.6 kg)   BMI Readings from Last 3 Encounters:  02/04/17 24.17 kg/m  12/17/16 24.25 kg/m  10/11/15 27.49 kg/m    Alcohol: no Tobacco use: never HIV, hep B, hep C: not interested STD testing and prevention (chl/gon/syphilis): not interested Intimate partner violence: no abuse Breast cancer: no lumps Cervical cancer screening: n/a Osteoporosis: sister has this; will get DEXA; discussed fall prevention Fall prevention/vitamin D: takes vitamin D once a day Lipids:  Lab Results  Component Value Date   CHOL 236 (H) 12/17/2016   CHOL 227 (H) 07/25/2015   Lab Results  Component Value Date   HDL 91 12/17/2016   HDL 88 07/25/2015   Lab Results  Component Value Date   LDLCALC 130 (H) 12/17/2016   LDLCALC 132 (H) 07/25/2015   Lab Results  Component Value Date   TRIG 73 12/17/2016   TRIG 37 07/25/2015   Lab Results  Component Value Date   CHOLHDL 2.6 12/17/2016   No results found for: LDLDIRECT Glucose:  Glucose  Date Value Ref Range Status  07/25/2015 98 65 - 99 mg/dL Final   Glucose, Bld  Date Value Ref Range Status  12/17/2016 104 (H) 65 - 99 mg/dL Final  10/09/2015 145 (H) 65 - 99 mg/dL Final    Colorectal cancer: referred to GI Lung cancer:  n/a AAA: n/a Aspirin: taking daily Diet: greens and dairy;  Exercise: mows the yard and weed eats; walks regularly Skin cancer: no worrisome moles  HPI  Review of Systems  Past Medical History:  Diagnosis Date  . Arthritis   . Borderline diabetes mellitus   . Hypercholesterolemia   . Prehypertension 12/17/2016    Past Surgical History:  Procedure Laterality Date  . ABDOMINAL HYSTERECTOMY     complete  . KNEE SURGERY Bilateral     Family History  Problem Relation Age of Onset  . Hypertension Mother   . Stroke Mother        4 mini strokes  . Thyroid disease Mother   . Kidney failure Father   . Diabetes Father   . Hypertension Father   . Kidney disease Father   . Emphysema Maternal Grandmother   . Diabetes Maternal Grandmother   . Hypertension Maternal Grandmother   . Diabetes Maternal Grandfather   . Alcohol abuse Maternal Grandfather   . Diabetes Paternal Grandmother   . Heart attack Paternal Grandmother   . Emphysema Paternal Grandfather   . Diabetes Paternal Grandfather   . Heart disease Paternal Grandfather   . Thyroid disease Sister   . Vitamin D deficiency Sister   . Arthritis Sister   .  COPD Sister   . Fibromyalgia Sister   . Hypertension Sister   . Hyperlipidemia Sister   . Asthma Sister   . Thyroid disease Sister   . Cancer Neg Hx     Social History   Social History  . Marital status: Married    Spouse name: N/A  . Number of children: N/A  . Years of education: N/A   Occupational History  . Not on file.   Social History Main Topics  . Smoking status: Never Smoker  . Smokeless tobacco: Never Used  . Alcohol use No  . Drug use: No  . Sexual activity: No   Other Topics Concern  . Not on file   Social History Narrative  . No narrative on file    Outpatient Encounter Prescriptions as of 02/04/2017  Medication Sig Note  . acetaminophen (TYLENOL 8 HOUR) 650 MG CR tablet Take 650 mg  by mouth every 8 (eight) hours as needed for pain.   Marland Kitchen aspirin 81 MG EC tablet Take 81 mg by mouth daily. Swallow whole.   . B Complex-C (SUPER B COMPLEX PO) Take 1 tablet by mouth daily.   . Calcium Carbonate-Vit D-Min (CALCIUM 1200 PO) Take 600 mg by mouth 2 (two) times daily.   Rolena Infante Sagrada 450 MG CAPS Take 450 mg by mouth daily as needed.  07/18/2015: Received from: [facility]: Take by mouth.  . Cinnamon 500 MG TABS Take 1,000 mg by mouth daily.    Marland Kitchen escitalopram (LEXAPRO) 10 MG tablet Take 1 tablet (10 mg total) by mouth daily.   . famotidine (PEPCID) 20 MG tablet Take 1 tablet (20 mg total) by mouth 2 (two) times daily.   . Homeopathic Products (CVS LEG CRAMPS PAIN RELIEF PO) Take 1 tablet by mouth daily as needed.   . naproxen sodium (ANAPROX) 220 MG tablet Take 220 mg by mouth 2 (two) times daily as needed. Take 1 hour or more after aspirin   . TURMERIC CURCUMIN PO Take 1 capsule by mouth 3 (three) times a week.    No facility-administered encounter medications on file as of 02/04/2017.     Functional Ability / Safety Screening 1.  Was the timed Get Up and Go test longer than 30 seconds?  no 2.  Does the patient need help with the phone, transportation, shopping,      preparing meals, housework, laundry, medications, or managing money?  no 3.  Does the patient's home have:  loose throw rugs in the hallway?   no      Grab bars in the bathroom? yes      Handrails on the stairs?   yes      Poor lighting?   no 4.  Has the patient noticed any hearing difficulties?   no  Fall Risk Assessment See under rooming  Depression Screen See under rooming Depression screen [facility] 2/9 02/04/2017 12/17/2016 10/11/2015  Decreased Interest 0 0 0  Down, Depressed, Hopeless 0 0 1  PHQ - 2 Score 0 0 1    Advanced Directives Does patient have a HCPOA?    no If yes, name and contact information: husband is fine, will get paperwork Does patient have a living will or  MOST form?  no  If terminal, and easing on out, would want measures taken so that she could kept alive until her family got there to say goodbye and then she could be taken off life support if nothing can be  done, but her family can say goodbye She would not want to be kept alive on machines; still wants [place] to try  Objective:   Vitals: BP 120/76 (BP Location: Right Arm)   Pulse 76   Temp 98.1 F (36.7 C) (Oral)   Resp 14   Wt 154 lb 4.8 oz (70 kg)   SpO2 97%   BMI 24.17 kg/m  Body mass index is 24.17 kg/m. No exam data present  Physical Exam Mood/affect:   Appearance:  Neatly dressed  6CIT Screen 02/04/2017  What Year? 0 points  What month? 0 points  What time? 0 points  Count back from 20 0 points  Months in reverse 0 points  Repeat phrase 2 points  Total Score 2    Assessment & Plan:     Annual Wellness Visit  Reviewed patient's Family Medical History Reviewed and updated list of patient's medical providers Assessment of cognitive impairment was done Assessed patient's functional ability Established a written schedule for health screening [place] Completed and Reviewed  Exercise Activities and Dietary recommendations Goals    . Reduce sugar intake (pt-stated)          She will try to reduce sugar intake        There is no immunization history on file for this patient.  Because she has never had immunizations; she is afraid of immunizations; most everybody that she knows says they get sick afterwards; she keeps her vitamins in her  Health Maintenance  Topic Date Due  . TETANUS/TDAP  09/18/1968  . MAMMOGRAM  05/06/2013  . COLONOSCOPY  05/07/2015  . INFLUENZA VACCINE  02/04/2017 (Originally 12/04/2016)  . PNA vac Low Risk Adult (1 of 2 - PCV13) 02/04/2017 (Originally 09/19/2014)  . DEXA SCAN  Completed  . Hepatitis C Screening  Completed    Discussed health benefits of physical activity, and encouraged her to engage in regular  exercise appropriate for her age and condition.   No orders of the defined types were placed in this encounter.   Current Outpatient Prescriptions:  .  acetaminophen (TYLENOL 8 HOUR) 650 MG CR tablet, Take 650 mg by mouth every 8 (eight) hours as needed for pain., Disp: , Rfl:  .  aspirin 81 MG EC tablet, Take 81 mg by mouth daily. Swallow whole., Disp: , Rfl:  .  B Complex-C (SUPER B COMPLEX PO), Take 1 tablet by mouth daily., Disp: , Rfl:  .  Calcium Carbonate-Vit D-Min (CALCIUM 1200 PO), Take 600 mg by mouth 2 (two) times daily., Disp: , Rfl:  .  Cascara Sagrada 450 MG CAPS, Take 450 mg by mouth daily as needed. , Disp: , Rfl:  .  Cinnamon 500 MG TABS, Take 1,000 mg by mouth daily. , Disp: , Rfl:  .  escitalopram (LEXAPRO) 10 MG tablet, Take 1 tablet (10 mg total) by mouth daily., Disp: 30 tablet, Rfl: 2 .  famotidine (PEPCID) 20 MG tablet, Take 1 tablet (20 mg total) by mouth 2 (two) times daily., Disp: 60 tablet, Rfl: 1 .  Homeopathic Products ([employer]S LEG CRAMPS PAIN RELIEF PO), Take 1 tablet by mouth daily as needed., Disp: , Rfl:  .  naproxen sodium (ANAPROX) 220 MG tablet, Take 220 mg by mouth 2 (two) times daily as needed. Take 1 hour or more after aspirin, Disp: , Rfl:  .  TURMERIC CURCUMIN PO, Take 1 capsule by mouth 3 (three) times a week., Disp: , Rfl:  There are no discontinued medications.  Next Medicare Wellness Visit in 12+ months  Problem List Items Addressed This Visit      Other   Preventative health care - Primary    USPSTF grade A and B recommendations reviewed with patient; age-appropriate recommendations, preventive care, screening tests, etc discussed and encouraged; healthy living encouraged; see AVS for patient education given to patient       Postmenopausal status    Order DEXA; 3 servings of calcium daily; weight bearing exercise, vit D      Relevant Orders   DG Bone Density    Other Visit Diagnoses    Screen for colon cancer       Relevant Orders    Ambulatory referral to Gastroenterology   Full code status       Relevant Orders   Full code

## 2017-02-04 NOTE — Assessment & Plan Note (Signed)
Order DEXA; 3 servings of calcium daily; weight bearing exercise, vit D

## 2017-02-04 NOTE — Patient Instructions (Addendum)
Try to get vitamin D either by being outdoors 20 minutes a day or take 800 to 1000 iu vitamin D3 once a day I do recommend yearly flu shots; for individuals who don't want flu shots, try to practice excellent hand hygiene, and avoid nursing homes, day cares, and hospitals during peak flu season; taking additional vitamin C daily during flu/cold season may help boost your immune system too Think about the flu shot, the pneumonia shots -- I really encourage you to think about those Health Maintenance  Topic Date Due  . TETANUS/TDAP  09/18/1969  . MAMMOGRAM  05/06/2013  . COLONOSCOPY  05/07/2015  . INFLUENZA VACCINE  02/04/2017 (Originally 12/04/2016)  . PNA vac Low Risk Adult (1 of 2 - PCV13) 02/04/2017 (Originally 09/19/2014)  . DEXA SCAN  Completed  . Hepatitis C Screening  Completed   If you get cut or have a puncture wound, please get a tetanus shot immediately Please do call to schedule your mammogram and bone density test; the number to schedule one at either [facility] or [facility] is [phone] We'll have you see the gastroenterologist about a colonoscopy If you have not heard anything from my staff in a week about any orders/referrals/studies from today, please contact us here to follow-up [phone]   Health Maintenance for Postmenopausal Women Menopause is a normal process in which your reproductive ability comes to an end. This process happens gradually over a span of months to years, usually between the ages of 73 and 80. Menopause is complete when you have missed 12 consecutive menstrual periods. It is important to talk with your health care provider about some of the most common conditions that affect postmenopausal women, such as heart disease, cancer, and bone loss (osteoporosis). Adopting a healthy lifestyle and getting preventive care can help to promote your health and wellness. Those actions can also lower your chances of developing some of  these common conditions. What should I know about menopause? During menopause, you may experience a number of symptoms, such as:  Moderate-to-severe hot flashes.  Night sweats.  Decrease in sex drive.  Mood swings.  Headaches.  Tiredness.  Irritability.  Memory problems.  Insomnia.  Choosing to treat or not to treat menopausal changes is an individual decision that you make with your health care provider. What should I know about hormone replacement therapy and supplements? Hormone therapy products are effective for treating symptoms that are associated with menopause, such as hot flashes and night sweats. Hormone replacement carries certain risks, especially as you become older. If you are thinking about using estrogen or estrogen with progestin treatments, discuss the benefits and risks with your health care provider. What should I know about heart disease and stroke? Heart disease, heart attack, and stroke become more likely as you age. This may be due, in part, to the hormonal changes that your body experiences during menopause. These can affect how your body processes dietary fats, triglycerides, and cholesterol. Heart attack and stroke are both medical emergencies. There are many things that you can do to help prevent heart disease and stroke:  Have your blood pressure checked at least every 1-2 years. High blood pressure causes heart disease and increases the risk of stroke.  If you are 61-16 years old, ask your health care provider if you should take aspirin to prevent a heart attack or a stroke.  Do not use any tobacco products, including cigarettes, chewing tobacco, or electronic cigarettes. If you need help  quitting, ask your health care provider.  It is important to eat a healthy diet and maintain a healthy weight. ? Be sure to include plenty of vegetables, fruits, low-fat dairy products, and lean protein. ? Avoid eating foods that are high in solid fats, added  sugars, or salt (sodium).  Get regular exercise. This is one of the most important things that you can do for your health. ? Try to exercise for at least 150 minutes each week. The type of exercise that you do should increase your heart rate and make you sweat. This is known as moderate-intensity exercise. ? Try to do strengthening exercises at least twice each week. Do these in addition to the moderate-intensity exercise.  Know your numbers.Ask your health care provider to check your cholesterol and your blood glucose. Continue to have your blood tested as directed by your health care provider.  What should I know about cancer screening? There are several types of cancer. Take the following steps to reduce your risk and to catch any cancer development as early as possible. Breast Cancer  Practice breast self-awareness. ? This means understanding how your breasts normally appear and feel. ? It also means doing regular breast self-exams. Let your health care provider know about any changes, no matter how small.  If you are 46 or older, have a clinician do a breast exam (clinical breast exam or CBE) every year. Depending on your age, family history, and medical history, it may be recommended that you also have a yearly breast X-ray (mammogram).  If you have a family history of breast cancer, talk with your health care provider about genetic screening.  If you are at high risk for breast cancer, talk with your health care provider about having an MRI and a mammogram every year.  Breast cancer (BRCA) gene test is recommended for women who have family members with BRCA-related cancers. Results of the assessment will determine the need for genetic counseling and BRCA1 and for BRCA2 testing. BRCA-related cancers include these types: ? Breast. This occurs in males or females. ? Ovarian. ? Tubal. This may also be called fallopian tube cancer. ? Cancer of the abdominal or pelvic lining (peritoneal  cancer). ? Prostate. ? Pancreatic.  Cervical, Uterine, and Ovarian Cancer Your health care provider may recommend that you be screened regularly for cancer of the pelvic organs. These include your ovaries, uterus, and vagina. This screening involves a pelvic exam, which includes checking for microscopic changes to the surface of your cervix (Pap test).  For women ages 21-65, health care providers may recommend a pelvic exam and a Pap test every three years. For women ages 44-65, they may recommend the Pap test and pelvic exam, combined with testing for human papilloma virus (HPV), every five years. Some types of HPV increase your risk of cervical cancer. Testing for HPV may also be done on women of any age who have unclear Pap test results.  Other health care providers may not recommend any screening for nonpregnant women who are considered low risk for pelvic cancer and have no symptoms. Ask your health care provider if a screening pelvic exam is right for you.  If you have had past treatment for cervical cancer or a condition that could lead to cancer, you need Pap tests and screening for cancer for at least 20 years after your treatment. If Pap tests have been discontinued for you, your risk factors (such as having a new sexual partner) need to be reassessed to  determine if you should start having screenings again. Some women have medical problems that increase the chance of getting cervical cancer. In these cases, your health care provider may recommend that you have screening and Pap tests more often.  If you have a family history of uterine cancer or ovarian cancer, talk with your health care provider about genetic screening.  If you have vaginal bleeding after reaching menopause, tell your health care provider.  There are currently no reliable tests available to screen for ovarian cancer.  Lung Cancer Lung cancer screening is recommended for adults 79-20 years old who are at high risk for  lung cancer because of a history of smoking. A yearly low-dose CT scan of the lungs is recommended if you:  Currently smoke.  Have a history of at least 30 pack-years of smoking and you currently smoke or have quit within the past 15 years. A pack-year is smoking an average of one pack of cigarettes per day for one year.  Yearly screening should:  Continue until it has been 15 years since you quit.  Stop if you develop a health problem that would prevent you from having lung cancer treatment.  Colorectal Cancer  This type of cancer can be detected and can often be prevented.  Routine colorectal cancer screening usually begins at age 93 and continues through age 30.  If you have risk factors for colon cancer, your health care provider may recommend that you be screened at an earlier age.  If you have a family history of colorectal cancer, talk with your health care provider about genetic screening.  Your health care provider may also recommend using home test kits to check for hidden blood in your stool.  A small camera at the end of a tube can be used to examine your colon directly (sigmoidoscopy or colonoscopy). This is done to check for the earliest forms of colorectal cancer.  Direct examination of the colon should be repeated every 5-10 years until age 1. However, if early forms of precancerous polyps or small growths are found or if you have a family history or genetic risk for colorectal cancer, you may need to be screened more often.  Skin Cancer  Check your skin from head to toe regularly.  Monitor any moles. Be sure to tell your health care provider: ? About any new moles or changes in moles, especially if there is a change in a mole's shape or color. ? If you have a mole that is larger than the size of a pencil eraser.  If any of your family members has a history of skin cancer, especially at a young age, talk with your health care provider about genetic  screening.  Always use sunscreen. Apply sunscreen liberally and repeatedly throughout the day.  Whenever you are outside, protect yourself by wearing long sleeves, pants, a wide-brimmed hat, and sunglasses.  What should I know about osteoporosis? Osteoporosis is a condition in which bone destruction happens more quickly than new bone creation. After menopause, you may be at an increased risk for osteoporosis. To help prevent osteoporosis or the bone fractures that can happen because of osteoporosis, the following is recommended:  If you are 8-27 years old, get at least 1,000 mg of calcium and at least 600 mg of vitamin D per day.  If you are older than age 65 but younger than age 27, get at least 1,200 mg of calcium and at least 600 mg of vitamin D per day.  If you are older than age 41, get at least 1,200 mg of calcium and at least 800 mg of vitamin D per day.  Smoking and excessive alcohol intake increase the risk of osteoporosis. Eat foods that are rich in calcium and vitamin D, and do weight-bearing exercises several times each week as directed by your health care provider. What should I know about how menopause affects my mental health? Depression may occur at any age, but it is more common as you become older. Common symptoms of depression include:  Low or sad mood.  Changes in sleep patterns.  Changes in appetite or eating patterns.  Feeling an overall lack of motivation or enjoyment of activities that you previously enjoyed.  Frequent crying spells.  Talk with your health care provider if you think that you are experiencing depression. What should I know about immunizations? It is important that you get and maintain your immunizations. These include:  Tetanus, diphtheria, and pertussis (Tdap) booster vaccine.  Influenza every year before the flu season begins.  Pneumonia vaccine.  Shingles vaccine.  Your health care provider may also recommend other  immunizations. This information is not intended to replace advice given to you by your health care provider. Make sure you discuss any questions you have with your health care provider. Document Released: 06/14/2005 Document Revised: 11/10/2015 Document Reviewed: 01/24/2015 Elsevier Interactive Patient Education  2018 [employer].

## 2017-02-04 NOTE — Assessment & Plan Note (Signed)
USPSTF grade A and B recommendations reviewed with patient; age-appropriate recommendations, preventive care, screening tests, etc discussed and encouraged; healthy living encouraged; see AVS for patient education given to patient  

## 2017-04-04 ENCOUNTER — Other Ambulatory Visit: Payer: Self-pay

## 2017-04-04 ENCOUNTER — Telehealth: Payer: Self-pay

## 2017-04-04 DIAGNOSIS — Z1211 Encounter for screening for malignant neoplasm of colon: Secondary | ICD-10-CM

## 2017-04-04 NOTE — Telephone Encounter (Signed)
Gastroenterology Pre-Procedure Review  Request Date:  Requesting Physician: Dr.   PATIENT REVIEW QUESTIONS: The patient responded to the following health history questions as indicated:    1. Are you having any GI issues? no 2. Do you have a personal history of Polyps? no 3. Do you have a family history of Colon Cancer or Polyps? no 4. Diabetes Mellitus? no 5. Joint replacements in the past 12 months?no 6. Major health problems in the past 3 months?no 7. Any artificial heart valves, MVP, or defibrillator?no    MEDICATIONS & ALLERGIES:    Patient reports the following regarding taking any anticoagulation/antiplatelet therapy:   Plavix, Coumadin, Eliquis, Xarelto, Lovenox, Pradaxa, Brilinta, or Effient? no Aspirin? no  Patient confirms/reports the following medications:  Current Outpatient Medications  Medication Sig Dispense Refill  . acetaminophen (TYLENOL 8 HOUR) 650 MG CR tablet Take 650 mg by mouth every 8 (eight) hours as needed for pain.    Marland Kitchen aspirin 81 MG EC tablet Take 81 mg by mouth daily. Swallow whole.    . B Complex-C (SUPER B COMPLEX PO) Take 1 tablet by mouth daily.    . Calcium Carbonate-Vit D-Min (CALCIUM 1200 PO) Take 600 mg by mouth 2 (two) times daily.    Rolena Infante Sagr[place] 450 MG CAPS Take 450 mg by mouth daily as needed.     . Cinnamon 500 MG TABS Take 1,000 mg by mouth daily.     Marland Kitchen escitalopram (LEXAPRO) 10 MG tablet Take 1 tablet (10 mg total) by mouth daily. 30 tablet 2  . famotidine (PEPCID) 20 MG tablet Take 1 tablet (20 mg total) by mouth 2 (two) times daily. 60 tablet 1  . Homeopathic Products ([employer] LEG CRAMPS PAIN RELIEF PO) Take 1 tablet by mouth daily as needed.    . naproxen sodium (ANAPROX) 220 MG tablet Take 220 mg by mouth 2 (two) times daily as needed. Take 1 hour or more after aspirin    . TURMERIC CURCUMIN PO Take 1 capsule by mouth 3 (three) times a week.     No current facility-administered medications for this visit.     Patient  confirms/reports the following allergies:  Allergies  Allergen Reactions  . Celecoxib Anxiety and Itching    No orders of the defined types were placed in this encounter.   AUTHORIZATION INFORMATION Primary Insurance: 1D#: Group #:  Secondary Insurance: 1D#: Group #:  SCHEDULE INFORMATION: Date:  Time: Location:

## 2017-05-01 ENCOUNTER — Other Ambulatory Visit: Payer: Self-pay

## 2017-05-01 ENCOUNTER — Encounter: Payer: Self-pay | Admitting: *Deleted

## 2017-05-08 ENCOUNTER — Other Ambulatory Visit: Payer: Self-pay

## 2017-05-08 MED ORDER — PEG 3350-KCL-NABCB-NACL-NASULF 236 G PO SOLR: ORAL | 0 refills | Status: DC

## 2017-05-08 NOTE — Discharge Instructions (Signed)
General Anesthesia, Adult, Care After °These instructions provide you with information about caring for yourself after your procedure. Your health care provider may also give you more specific instructions. Your treatment has been planned according to current medical practices, but problems sometimes occur. Call your health care provider if you have any problems or questions after your procedure. °What can I expect after the procedure? °After the procedure, it is common to have: °· Vomiting. °· A sore throat. °· Mental slowness. ° °It is common to feel: °· Nauseous. °· Cold or shivery. °· Sleepy. °· Tired. °· Sore or achy, even in parts of your body where you did not have surgery. ° °Follow these instructions at home: °For at least 24 hours after the procedure: °· Do not: °? Participate in activities where you could fall or become injured. °? Drive. °? Use heavy machinery. °? Drink alcohol. °? Take sleeping pills or medicines that cause drowsiness. °? Make important decisions or sign legal documents. °? Take care of children on your own. °· Rest. °Eating and drinking °· If you vomit, drink water, juice, or soup when you can drink without vomiting. °· Drink enough fluid to keep your urine clear or pale yellow. °· Make sure you have little or no nausea before eating solid foods. °· Follow the diet recommended by your health care provider. °General instructions °· Have a responsible adult stay with you until you are awake and alert. °· Return to your normal activities as told by your health care provider. Ask your health care provider what activities are safe for you. °· Take over-the-counter and prescription medicines only as told by your health care provider. °· If you smoke, do not smoke without supervision. °· Keep all follow-up visits as told by your health care provider. This is important. °Contact a health care provider if: °· You continue to have nausea or vomiting at home, and medicines are not helpful. °· You  cannot drink fluids or start eating again. °· You cannot urinate after 8-12 hours. °· You develop a skin rash. °· You have fever. °· You have increasing redness at the site of your procedure. °Get help right away if: °· You have difficulty breathing. °· You have chest pain. °· You have unexpected bleeding. °· You feel that you are having a life-threatening or urgent problem. °This information is not intended to replace advice given to you by your health care provider. Make sure you discuss any questions you have with your health care provider. °Document Released: 07/29/2000 Document Revised: 09/25/2015 Document Reviewed: 04/06/2015 °Elsevier Interactive Patient Education © 2018 Elsevier Inc. ° °

## 2017-05-09 ENCOUNTER — Ambulatory Visit
Admission: RE | Admit: 2017-05-09 | Discharge: 2017-05-09 | Disposition: A | Discharge status: Home / Self Care | Payer: Medicare HMO | Source: Ambulatory Visit | Attending: Gastroenterology | Admitting: Gastroenterology

## 2017-05-09 ENCOUNTER — Ambulatory Visit: Payer: Medicare HMO | Admitting: Anesthesiology

## 2017-05-09 ENCOUNTER — Encounter
Admission: RE | Disposition: A | Discharge status: Home / Self Care | Payer: Self-pay | Source: Ambulatory Visit | Attending: Gastroenterology

## 2017-05-09 DIAGNOSIS — Z888 Allergy status to other drugs, medicaments and biological substances status: Secondary | ICD-10-CM | POA: Insufficient documentation

## 2017-05-09 DIAGNOSIS — Z833 Family history of diabetes mellitus: Secondary | ICD-10-CM | POA: Insufficient documentation

## 2017-05-09 DIAGNOSIS — Z823 Family history of stroke: Secondary | ICD-10-CM | POA: Diagnosis not present

## 2017-05-09 DIAGNOSIS — Z79899 Other long term (current) drug therapy: Secondary | ICD-10-CM | POA: Insufficient documentation

## 2017-05-09 DIAGNOSIS — Z1211 Encounter for screening for malignant neoplasm of colon: Secondary | ICD-10-CM | POA: Diagnosis not present

## 2017-05-09 DIAGNOSIS — M1990 Unspecified osteoarthritis, unspecified site: Secondary | ICD-10-CM | POA: Insufficient documentation

## 2017-05-09 DIAGNOSIS — Z8249 Family history of ischemic heart disease and other diseases of the circulatory system: Secondary | ICD-10-CM | POA: Insufficient documentation

## 2017-05-09 DIAGNOSIS — E7800 Pure hypercholesterolemia, unspecified: Secondary | ICD-10-CM | POA: Diagnosis not present

## 2017-05-09 DIAGNOSIS — Z811 Family history of alcohol abuse and dependence: Secondary | ICD-10-CM | POA: Insufficient documentation

## 2017-05-09 DIAGNOSIS — D122 Benign neoplasm of ascending colon: Secondary | ICD-10-CM | POA: Diagnosis not present

## 2017-05-09 DIAGNOSIS — R7309 Other abnormal glucose: Secondary | ICD-10-CM | POA: Diagnosis not present

## 2017-05-09 DIAGNOSIS — K640 First degree hemorrhoids: Secondary | ICD-10-CM | POA: Diagnosis not present

## 2017-05-09 DIAGNOSIS — Z825 Family history of asthma and other chronic lower respiratory diseases: Secondary | ICD-10-CM | POA: Insufficient documentation

## 2017-05-09 DIAGNOSIS — Z7982 Long term (current) use of aspirin: Secondary | ICD-10-CM | POA: Insufficient documentation

## 2017-05-09 HISTORY — DX: Other complications of anesthesia, initial encounter: T88.59XA

## 2017-05-09 HISTORY — DX: Presence of dental prosthetic device (complete) (partial): Z97.2

## 2017-05-09 HISTORY — PX: COLONOSCOPY WITH PROPOFOL: SHX5780

## 2017-05-09 HISTORY — DX: Adverse effect of unspecified anesthetic, initial encounter: T41.45XA

## 2017-05-09 HISTORY — DX: Anemia, unspecified: D64.9

## 2017-05-09 SURGERY — COLONOSCOPY WITH PROPOFOL
Anesthesia: General | Wound class: Contaminated

## 2017-05-09 MED ORDER — PROPOFOL 10 MG/ML IV BOLUS
INTRAVENOUS | Status: DC | PRN
  Administered 2017-05-09 (×3): 20 mg via INTRAVENOUS
  Administered 2017-05-09: 40 mg via INTRAVENOUS
  Administered 2017-05-09: 20 mg via INTRAVENOUS
  Administered 2017-05-09: 80 mg via INTRAVENOUS
  Administered 2017-05-09 (×2): 20 mg via INTRAVENOUS

## 2017-05-09 MED ORDER — STERILE WATER FOR IRRIGATION IR SOLN
Status: DC | PRN
  Administered 2017-05-09: 08:00:00

## 2017-05-09 MED ORDER — LACTATED RINGERS IV SOLN
INTRAVENOUS | Status: DC
  Administered 2017-05-09 (×2): via INTRAVENOUS

## 2017-05-09 MED ORDER — OXYCODONE HCL 5 MG PO TABS: 5.0000 mg | ORAL_TABLET | Freq: Once | ORAL | Status: DC | PRN

## 2017-05-09 MED ORDER — OXYCODONE HCL 5 MG/5ML PO SOLN: 5.0000 mg | Freq: Once | ORAL | Status: DC | PRN

## 2017-05-09 MED ORDER — LIDOCAINE HCL (CARDIAC) 20 MG/ML IV SOLN
INTRAVENOUS | Status: DC | PRN
  Administered 2017-05-09: 20 mg via INTRAVENOUS

## 2017-05-09 SURGICAL SUPPLY — 23 items

## 2017-05-09 NOTE — H&P (Signed)
Lucilla Lame, MD [address] [place], [place] [address] Phone: [phone] Fax : [phone]  Primary Care Physician:  Arnetha Courser, MD Primary Gastroenterologist:  Dr. Allen Norris  Pre-Procedure History & Physical: HPI:  Ebony [name] is a 67 y.o. female is here for a screening colonoscopy.   Past Medical History:  Diagnosis Date  . Anemia   . Arthritis    knees, hands  . Borderline diabetes mellitus   . Complication of anesthesia    slow to wake  . Hypercholesterolemia   . Prehypertension 12/17/2016  . Wears dentures    full upper and lower    Past Surgical History:  Procedure Laterality Date  . ABDOMINAL HYSTERECTOMY     complete  . KNEE SURGERY Bilateral     Prior to Admission medications   Medication Sig Start Date End Date Taking? Authorizing Provider  acetaminophen (TYLENOL 8 HOUR) 650 MG CR tablet Take 650 mg by mouth every 8 (eight) hours as needed for pain.   Yes [provider]  aspirin 81 MG EC tablet Take 81 mg by mouth daily. Swallow whole.   Yes [provider]  B Complex-C (SUPER B COMPLEX PO) Take 1 tablet by mouth daily.   Yes [provider]  Calcium Carbonate-Vit D-Min (CALCIUM 1200 PO) Take 600 mg by mouth 2 (two) times daily.   Yes [provider]  Cascara Sagrada 450 MG CAPS Take 450 mg by mouth daily as needed.    Yes [provider]  Cinnamon 500 MG TABS Take 1,000 mg by mouth daily.    Yes [provider]  Cyanocobalamin (VITAMIN B-12 SL) Place under the tongue daily.   Yes [provider]  escitalopram (LEXAPRO) 10 MG tablet Take 1 tablet (10 mg total) by mouth daily. 01/02/17  Yes Lada, Satira Anis, MD  famotidine (PEPCID) 20 MG tablet Take 1 tablet (20 mg total) by mouth 2 (two) times daily. 12/17/16  Yes Lada, Satira Anis, MD  Homeopathic Products ([employer]S LEG CRAMPS PAIN RELIEF PO) Take 1 tablet by mouth daily as needed.   Yes [provider]  naproxen sodium  (ANAPROX) 220 MG tablet Take 220 mg by mouth 2 (two) times daily as needed. Take 1 hour or more after aspirin   Yes [provider]  TURMERIC CURCUMIN PO Take 1 capsule by mouth 3 (three) times a week.   Yes [provider]  polyethylene glycol (GOLYTELY) 236 g solution Drink one 8 oz glass every 20 mins until stools are clear starting at 5:00pm today. 05/08/17   Lucilla Lame, MD    Allergies as of 04/04/2017 - Review Complete 02/04/2017  Allergen Reaction Noted  . Celecoxib Anxiety and Itching 07/11/2015    Family History  Problem Relation Age of Onset  . Hypertension Mother   . Stroke Mother        4 mini strokes  . Thyroid disease Mother   . Kidney failure Father   . Diabetes Father   . Hypertension Father   . Kidney disease Father   . Emphysema Maternal Grandmother   . Diabetes Maternal Grandmother   . Hypertension Maternal Grandmother   . Diabetes Maternal Grandfather   . Alcohol abuse Maternal Grandfather   . Diabetes Paternal Grandmother   . Heart attack Paternal Grandmother   . Emphysema Paternal Grandfather   . Diabetes Paternal Grandfather   . Heart disease Paternal Grandfather   . Thyroid disease Sister   . Vitamin D  deficiency Sister   . Arthritis Sister   . COPD Sister   . Fibromyalgia Sister   . Hypertension Sister   . Hyperlipidemia Sister   . Asthma Sister   . Thyroid disease Sister   . Cancer Neg Hx     Social History   Socioeconomic History  . Marital status: Married    Spouse name: Not on file  . Number of children: Not on file  . Years of education: Not on file  . Highest education level: Not on file  Social Needs  . Financial resource strain: Not on file  . Food insecurity - worry: Not on file  . Food insecurity - inability: Not on file  . Transportation needs - medical: Not on file  . Transportation needs - non-medical: Not on file  Occupational History  . Not on file  Tobacco Use  . Smoking status: Never Smoker  .  Smokeless tobacco: Never Used  Substance and Sexual Activity  . Alcohol use: No  . Drug use: No  . Sexual activity: No  Other Topics Concern  . Not on file  Social History Narrative  . Not on file    Review of Systems: See HPI, otherwise negative ROS  Physical Exam: BP 124/82   Pulse 65   Temp 97.9 F (36.6 C)   Ht 5\' 4"  (1.626 m)   Wt 151 lb (68.5 kg)   SpO2 100%   BMI 25.92 kg/m  General:   Alert,  pleasant and cooperative in NAD Head:  Normocephalic and atraumatic. Neck:  Supple; no masses or thyromegaly. Lungs:  Clear throughout to auscultation.    Heart:  Regular rate and rhythm. Abdomen:  Soft, nontender and nondistended. Normal bowel sounds, without guarding, and without rebound.   Neurologic:  Alert and  oriented x4;  grossly normal neurologically.  Impression/Plan: Ebony [name] is now here to undergo a screening colonoscopy.  Risks, benefits, and alternatives regarding colonoscopy have been reviewed with the patient.  Questions have been answered.  All parties agreeable.

## 2017-05-09 NOTE — Op Note (Signed)
[facility] Gastroenterology Patient Name: Fumiye Lubben Procedure Date: 05/09/2017 7:46 AM MRN: [id number] Account #: [id number] Date of Birth: 03/04/1951 Admit Type: Outpatient Age: 67 Room: [facility] OR ROOM 01 Gender: Female Note Status: Finalized Procedure:            Colonoscopy Indications:          Screening for colorectal malignant neoplasm Providers:            Lucilla Lame MD, MD Referring MD:         Arnetha Courser (Referring MD) Medicines:            Propofol per Anesthesia Complications:        No immediate complications. Procedure:            Pre-Anesthesia Assessment:                       - Prior to the procedure, a History and Physical was                        performed, and patient medications and allergies were                        reviewed. The patient's tolerance of previous                        anesthesia was also reviewed. The risks and benefits of                        the procedure and the sedation options and risks were                        discussed with the patient. All questions were                        answered, and informed consent was obtained. Prior                        Anticoagulants: The patient has taken no previous                        anticoagulant or antiplatelet agents. ASA Grade                        Assessment: II - A patient with mild systemic disease.                        After reviewing the risks and benefits, the patient was                        deemed in satisfactory condition to undergo the                        procedure.                       After obtaining informed consent, the colonoscope was                        passed under direct vision. Throughout the procedure,  the patient's blood pressure, pulse, and oxygen                        saturations were monitored continuously. The Olympus                        Colonoscope [id number]) was introduced through the                   anus and advanced to the the cecum, identified by                        appendiceal orifice and ileocecal valve. The                        colonoscopy was performed without difficulty. The                        patient tolerated the procedure well. The quality of                        the bowel preparation was fair. Findings:      The perianal and digital rectal examinations were normal.      Non-bleeding internal hemorrhoids were found during retroflexion. The       hemorrhoids were Grade II (internal hemorrhoids that prolapse but reduce       spontaneously). Impression:           - Preparation of the colon was fair.                       - Non-bleeding internal hemorrhoids.                       - No specimens collected. Recommendation:       - Discharge patient to home.                       - Resume previous diet.                       - Continue present medications.                       - Repeat colonoscopy in 10 years for screening unless                        any change in family history or lower GI problems. Procedure Code(s):    --- Professional ---                       [phone], Colonoscopy, flexible; diagnostic, including                        collection of specimen(s) by brushing or washing, when                        performed (separate procedure) Diagnosis Code(s):    --- Professional ---                       Z12.11, Encounter for screening for malignant neoplasm  of colon CPT copyright 2016 American Medical Association. All rights reserved. The codes documented in this report are preliminary and upon coder review may  be revised to meet current compliance requirements. Lucilla Lame MD, MD 05/09/2017 8:24:59 AM This report has been signed electronically. Number of Addenda: 0 Note Initiated On: 05/09/2017 7:46 AM Scope Withdrawal Time: 0 hours 9 minutes 19 seconds  Total Procedure Duration: 0 hours 15 minutes 0 seconds        [facility]

## 2017-05-09 NOTE — Anesthesia Preprocedure Evaluation (Signed)
Anesthesia Evaluation  Patient identified by MRN, date of birth, ID band  Reviewed: NPO status   History of Anesthesia Complications (+) PROLONGED EMERGENCE and history of anesthetic complications  Airway Mallampati: II  TM Distance: >3 FB Neck ROM: full    Dental  (+) Upper Dentures, Lower Dentures   Pulmonary neg pulmonary ROS,    Pulmonary exam normal        Cardiovascular Exercise Tolerance: Good negative cardio ROS Normal cardiovascular exam     Neuro/Psych Anxiety negative neurological ROS     GI/Hepatic Neg liver ROS, GERD  Controlled,  Endo/Other  negative endocrine ROS  Renal/GU negative Renal ROS  negative genitourinary   Musculoskeletal  (+) Arthritis ,   Abdominal   Peds  Hematology  (+) anemia ,   Anesthesia Other Findings tiva  Reproductive/Obstetrics                             Anesthesia Physical Anesthesia Plan  ASA: II  Anesthesia Plan: General   Post-op Pain Management:    Induction:   PONV Risk Score and Plan:   Airway Management Planned:   Additional Equipment:   Intra-op Plan:   Post-operative Plan:   Informed Consent: I have reviewed the patients History and Physical, chart, labs and discussed the procedure including the risks, benefits and alternatives for the proposed anesthesia with the patient or authorized representative who has indicated his/her understanding and acceptance.     Plan Discussed with: CRNA  Anesthesia Plan Comments:         Anesthesia Quick Evaluation

## 2017-05-09 NOTE — Transfer of Care (Signed)
Immediate Anesthesia Transfer of Care Note  Patient: Ebony [name]  Procedure(s) Performed: COLONOSCOPY WITH PROPOFOL (N/A )  Patient Location: PACU  Anesthesia Type: General  Level of Consciousness: awake, alert  and patient cooperative  Airway and Oxygen Therapy: Patient Spontanous Breathing and Patient connected to supplemental oxygen  Post-op Assessment: Post-op Vital signs reviewed, Patient's Cardiovascular Status Stable, Respiratory Function Stable, Patent Airway and No signs of Nausea or vomiting  Post-op Vital Signs: Reviewed and stable  Complications: No apparent anesthesia complications

## 2017-05-09 NOTE — Anesthesia Postprocedure Evaluation (Signed)
Anesthesia Post Note  Patient: Ebony [name]  Procedure(s) Performed: COLONOSCOPY WITH PROPOFOL (N/A )  Patient location during evaluation: PACU Anesthesia Type: General Level of consciousness: awake and alert Pain management: pain level controlled Vital Signs Assessment: post-procedure vital signs reviewed and stable Respiratory status: spontaneous breathing, nonlabored ventilation, respiratory function stable and patient connected to nasal cannula oxygen Cardiovascular status: blood pressure returned to baseline and stable Postop Assessment: no apparent nausea or vomiting Anesthetic complications: no    Lalita Ebel

## 2017-05-19 ENCOUNTER — Ambulatory Visit: Payer: Self-pay | Admitting: *Deleted

## 2017-05-19 ENCOUNTER — Ambulatory Visit: Payer: Medicare HMO | Admitting: Family Medicine

## 2017-05-19 ENCOUNTER — Encounter: Payer: Self-pay | Admitting: Family Medicine

## 2017-05-19 VITALS — BP 132/80 | HR 97 | Temp 97.9°F | Resp 14 | Wt 148.8 lb

## 2017-05-19 DIAGNOSIS — F419 Anxiety disorder, unspecified: Secondary | ICD-10-CM

## 2017-05-19 DIAGNOSIS — R21 Rash and other nonspecific skin eruption: Secondary | ICD-10-CM | POA: Diagnosis not present

## 2017-05-19 DIAGNOSIS — R12 Heartburn: Secondary | ICD-10-CM | POA: Diagnosis not present

## 2017-05-19 DIAGNOSIS — W57XXXA Bitten or stung by nonvenomous insect and other nonvenomous arthropods, initial encounter: Secondary | ICD-10-CM | POA: Diagnosis not present

## 2017-05-19 MED ORDER — HYDROXYZINE HCL 10 MG PO TABS: 10.0000 mg | ORAL_TABLET | Freq: Three times a day (TID) | ORAL | 1 refills | Status: DC | PRN

## 2017-05-19 MED ORDER — FAMOTIDINE 20 MG PO TABS: 20.0000 mg | ORAL_TABLET | Freq: Two times a day (BID) | ORAL | 5 refills | Status: DC

## 2017-05-19 MED ORDER — TRIAMCINOLONE ACETONIDE 0.1 % EX CREA: 1.0000 "application " | TOPICAL_CREAM | Freq: Two times a day (BID) | CUTANEOUS | 0 refills | Status: DC

## 2017-05-19 NOTE — Patient Instructions (Addendum)
Use the new cream for itching Use insect repellent Let me know if more places arise and you haven't been exposed or if they changed Use the new medicine if needed for anxiety  Try to limit or avoid triggers like coffee, caffeinated beverages, onions, chocolate, spicy foods, peppermint, acidic foods like pizza, spaghetti sauce, and orange juice Lose weight if you are overweight or obese Try elevating the head of your bed by placing a small wedge between your mattress and box springs to keep acid in the stomach at night instead of coming up into your esophagus

## 2017-05-19 NOTE — Assessment & Plan Note (Signed)
Explained that the SSRI doesn't work well taking PRN; stop that and use hydroxyzine PRN

## 2017-05-19 NOTE — Progress Notes (Addendum)
BP 132/80   Pulse 97   Temp 97.9 F (36.6 C) (Oral)   Resp 14   Wt 148 lb 12.8 oz (67.5 kg)   SpO2 97%   BMI 25.54 kg/m    Subjective:    Patient ID: Ebony [name], female    DOB: [birth year], 67 y.o.   MRN: [id number]  HPI: Ebony [name] is a 67 y.o. female  Chief Complaint  Patient presents with  . Rash    multiple spots on arms and legs.  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 Red bumps on legs, thighs, nowhere else Itch; but not painful Tried something topically, helps for 3-4 hours  She had her colonoscopy, everything went fine she says  She has some anxiety, hits her; had been taking lexapro periodically; chronic issue  She has acid reflux; not taking her time to eat; medicine works well; needs reifll; chronic issue  Depression screen [facility] 2/9 05/19/2017 02/04/2017 12/17/2016 10/11/2015  Decreased Interest 0 0 0 0  Down, Depressed, Hopeless 0 0 0 1  PHQ - 2 Score 0 0 0 1    Relevant past medical, surgical, family and social history reviewed Past Medical History:  Diagnosis Date  . Anemia   . Arthritis    knees, hands  . Borderline diabetes mellitus   . Complication of anesthesia    slow to wake  . Hypercholesterolemia   . Prehypertension 12/17/2016  . Wears dentures    full upper and lower   Past Surgical History:  Procedure Laterality Date  . ABDOMINAL HYSTERECTOMY     complete  . COLONOSCOPY WITH PROPOFOL N/A 05/09/2017   Procedure: COLONOSCOPY WITH PROPOFOL;  Surgeon: Lucilla Lame, MD;  Location: [place];  Service: Endoscopy;  Laterality: N/A;  . KNEE SURGERY Bilateral    Family History  Problem Relation Age of Onset  . Hypertension Mother   . Stroke Mother        4 mini strokes  . Thyroid disease Mother   . Kidney failure Father   . Diabetes Father   . Hypertension  Father   . Kidney disease Father   . Emphysema Maternal Grandmother   . Diabetes Maternal Grandmother   . Hypertension Maternal Grandmother   . Diabetes Maternal Grandfather   . Alcohol abuse Maternal Grandfather   . Diabetes Paternal Grandmother   . Heart attack Paternal Grandmother   . Emphysema Paternal Grandfather   . Diabetes Paternal Grandfather   . Heart disease Paternal Grandfather   . Thyroid disease Sister   . Vitamin D deficiency Sister   . Arthritis Sister   . COPD Sister   . Fibromyalgia Sister   . Hypertension Sister   . Hyperlipidemia Sister   . Asthma Sister   . Thyroid disease Sister   . Cancer Neg Hx    Social History   Tobacco Use  . Smoking status: Never Smoker  . Smokeless tobacco: Never Used  Substance Use Topics  . Alcohol use: No  . Drug use: No    Interim medical history since last visit reviewed. Allergies and medications reviewed  Review of Systems Per HPI unless specifically indicated above     Objective:    BP 132/80   Pulse 97   Temp 97.9 F (36.6 C) (Oral)   Resp 14   Wt  148 lb 12.8 oz (67.5 kg)   SpO2 97%   BMI 25.54 kg/m   Wt Readings from Last 3 Encounters:  05/19/17 148 lb 12.8 oz (67.5 kg)  05/09/17 151 lb (68.5 kg)  02/04/17 154 lb 4.8 oz (70 kg)    Physical Exam  Constitutional: She appears well-developed and well-nourished. No distress.  Eyes: EOM are normal. No scleral icterus.  Neck: No thyromegaly present.  Cardiovascular: Normal rate.  Pulmonary/Chest: Effort normal.  Abdominal: She exhibits no distension.  Skin: No pallor.  On the anterior thighs, there are discrete erythematous area, with central puncta; mild swelling; consistent with insect bites; no vesicles; not in a dermatomal distribution  Psychiatric: Her behavior is normal. Judgment and thought content normal. Her mood appears not anxious.  Very pleasant and cooperative      Assessment & Plan:   Problem List Items Addressed This Visit       Other   Anxiety    Explained that the SSRI doesn't work well taking PRN; stop that and use hydroxyzine PRN      Relevant Medications   hydrOXYzine (ATARAX/VISTARIL) 10 MG tablet   Heartburn    Encouraged pt to limit/avoid triggers; see AVS       Other Visit Diagnoses    Skin rash    -  Primary   secondary to insect bites (suspected); cream prescribed; let me know if not resolving   Insect bite, initial encounter       pattern is consistent with contact on her lap; discussed likely source; cream for symptoms; avoidance is key; notify me with changes or recurrence       Follow up plan: No Follow-up on file.  An after-visit summary was printed and given to the patient at [place].  Please see the patient instructions which may contain other information and recommendations beyond what is mentioned above in the assessment and plan.  Meds ordered this encounter  Medications  . hydrOXYzine (ATARAX/VISTARIL) 10 MG tablet    Sig: Take 1 tablet (10 mg total) by mouth 3 (three) times daily as needed. For anxiety or itching    Dispense:  30 tablet    Refill:  1  . triamcinolone cream (KENALOG) 0.1 %    Sig: Apply 1 application topically 2 (two) times daily.    Dispense:  30 g    Refill:  0  . famotidine (PEPCID) 20 MG tablet    Sig: Take 1 tablet (20 mg total) by mouth 2 (two) times daily.    Dispense:  60 tablet    Refill:  5    No orders of the defined types were placed in this encounter.   One new problem, two chronic problems; 3 prescriptions

## 2017-05-19 NOTE — Telephone Encounter (Signed)
Pt  In  No acute   Severe distress    Pt    Reports  Has  Tried  otc meds   Without releif   She  Reports rash  On  Both legs  For  4-5  Days  That itches .    She   Denies  Any known  Causative  Agent  Appointment made  Today  With  Dr Sanda Klein  At  [place]     Reason for Disposition . Mild widespread rash  Answer Assessment - Initial Assessment Questions 1.) CALLER DIAGNOSIS: "What do you think is causing the rash?" (e.g., Chickenpox, Hives, Impetigo, Athlete's Foot, etc.) UNKNOWN 2.) LOCATION:  "Is it widespread or localized?" SPREADING 3.) NEW MEDICATIONS: "Are you taking any new medicine?"       No   New   Medications  Protocols used: RASH OR REDNESS - WIDESPREAD-A-AH, RASH - GUIDELINE SELECTION-A-AH

## 2017-05-24 NOTE — Assessment & Plan Note (Signed)
Encouraged pt to limit/avoid triggers; see AVS

## 2017-06-06 ENCOUNTER — Other Ambulatory Visit: Payer: Self-pay

## 2017-06-06 MED ORDER — HYDROXYZINE HCL 10 MG PO TABS: 10.0000 mg | ORAL_TABLET | Freq: Three times a day (TID) | ORAL | 1 refills | Status: DC | PRN

## 2017-06-06 NOTE — Telephone Encounter (Signed)
90 day

## 2017-07-03 ENCOUNTER — Other Ambulatory Visit: Payer: Self-pay | Admitting: Family Medicine

## 2017-07-03 NOTE — Telephone Encounter (Signed)
Pt also requesting an eye drops called in as well.

## 2017-07-03 NOTE — Telephone Encounter (Signed)
Patient will need to be seen or go to urgent care if she thinks she needs antibiotics; same for eye drops Sorry but that requires an evaluation

## 2017-07-03 NOTE — Telephone Encounter (Signed)
Please have pt scheduled for an appt w/ Raquel Sarna or [facility]. Thanks!

## 2017-07-04 NOTE — Telephone Encounter (Signed)
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

## 2017-07-23 ENCOUNTER — Other Ambulatory Visit: Payer: Self-pay | Admitting: Family Medicine

## 2017-08-04 ENCOUNTER — Ambulatory Visit
Admission: RE | Admit: 2017-08-04 | Discharge: 2017-08-04 | Disposition: A | Discharge status: Home / Self Care | Payer: Medicare HMO | Source: Ambulatory Visit | Attending: Family Medicine | Admitting: Family Medicine

## 2017-08-04 DIAGNOSIS — Z1239 Encounter for other screening for malignant neoplasm of breast: Secondary | ICD-10-CM

## 2017-08-04 DIAGNOSIS — H16223 Keratoconjunctivitis sicca, not specified as Sjogren's, bilateral: Secondary | ICD-10-CM | POA: Diagnosis not present

## 2017-08-04 DIAGNOSIS — Z1231 Encounter for screening mammogram for malignant neoplasm of breast: Secondary | ICD-10-CM | POA: Insufficient documentation

## 2017-08-04 HISTORY — PX: EYE SURGERY: SHX253

## 2017-08-06 DIAGNOSIS — H16223 Keratoconjunctivitis sicca, not specified as Sjogren's, bilateral: Secondary | ICD-10-CM | POA: Diagnosis not present

## 2017-08-08 ENCOUNTER — Other Ambulatory Visit: Payer: Self-pay | Admitting: *Deleted

## 2017-08-08 ENCOUNTER — Inpatient Hospital Stay
Admission: RE | Admit: 2017-08-08 | Discharge: 2017-08-08 | Disposition: A | Discharge status: Home / Self Care | Payer: Self-pay | Source: Ambulatory Visit | Attending: *Deleted | Admitting: *Deleted

## 2017-08-08 DIAGNOSIS — Z9289 Personal history of other medical treatment: Secondary | ICD-10-CM

## 2017-08-18 DIAGNOSIS — H16223 Keratoconjunctivitis sicca, not specified as Sjogren's, bilateral: Secondary | ICD-10-CM | POA: Diagnosis not present

## 2017-08-27 DIAGNOSIS — H1013 Acute atopic conjunctivitis, bilateral: Secondary | ICD-10-CM | POA: Diagnosis not present

## 2017-08-28 DIAGNOSIS — H16223 Keratoconjunctivitis sicca, not specified as Sjogren's, bilateral: Secondary | ICD-10-CM | POA: Diagnosis not present

## 2017-09-03 DIAGNOSIS — H16223 Keratoconjunctivitis sicca, not specified as Sjogren's, bilateral: Secondary | ICD-10-CM | POA: Diagnosis not present

## 2017-09-18 DIAGNOSIS — H16223 Keratoconjunctivitis sicca, not specified as Sjogren's, bilateral: Secondary | ICD-10-CM | POA: Diagnosis not present

## 2017-09-23 ENCOUNTER — Other Ambulatory Visit: Payer: Self-pay | Admitting: Family Medicine

## 2017-10-02 DIAGNOSIS — H16223 Keratoconjunctivitis sicca, not specified as Sjogren's, bilateral: Secondary | ICD-10-CM | POA: Diagnosis not present

## 2017-10-29 ENCOUNTER — Telehealth: Payer: Self-pay | Admitting: Family Medicine

## 2017-10-29 MED ORDER — SCOPOLAMINE 1 MG/3DAYS TD PT72: 1.0000 | MEDICATED_PATCH | TRANSDERMAL | 0 refills | Status: DC

## 2017-10-29 NOTE — Telephone Encounter (Signed)
Yes, Rx sent for 4 patches (which will cover 12 days) If going longer, back to me

## 2017-10-29 NOTE — Telephone Encounter (Signed)
Pt would like to know if something can be called in for her for motion sickness. Pt is going on a cruise on Sunday. [facility].

## 2017-10-30 DIAGNOSIS — H16223 Keratoconjunctivitis sicca, not specified as Sjogren's, bilateral: Secondary | ICD-10-CM | POA: Diagnosis not present

## 2017-12-11 DIAGNOSIS — H16223 Keratoconjunctivitis sicca, not specified as Sjogren's, bilateral: Secondary | ICD-10-CM | POA: Diagnosis not present

## 2017-12-12 ENCOUNTER — Encounter: Payer: Self-pay | Admitting: Family Medicine

## 2017-12-12 ENCOUNTER — Ambulatory Visit (INDEPENDENT_AMBULATORY_CARE_PROVIDER_SITE_OTHER): Payer: Medicare HMO | Admitting: Family Medicine

## 2017-12-12 VITALS — BP 118/72 | HR 99 | Temp 98.2°F | Resp 14 | Ht 67.5 in | Wt 143.8 lb

## 2017-12-12 DIAGNOSIS — J0111 Acute recurrent frontal sinusitis: Secondary | ICD-10-CM

## 2017-12-12 MED ORDER — FLUTICASONE PROPIONATE 50 MCG/ACT NA SUSP
2.0000 | Freq: Every day | NASAL | 6 refills | Status: DC
Start: 2017-12-12 — End: 2018-09-15

## 2017-12-12 MED ORDER — DOXYCYCLINE HYCLATE 100 MG PO TABS: 100.0000 mg | ORAL_TABLET | Freq: Two times a day (BID) | ORAL | 0 refills | Status: DC

## 2017-12-12 NOTE — Patient Instructions (Addendum)
Start the antibiotics Please do eat yogurt or kimchi or take a probiotic daily for the next month We want to replace the healthy germs in the gut If you notice foul, watery diarrhea in the next two months, schedule an appointment RIGHT AWAY or go to an urgent care or the emergency room if a holiday or over a weekend  Sinusitis, Adult Sinusitis is soreness and inflammation of your sinuses. Sinuses are hollow spaces in the bones around your face. They are located:  Around your eyes.  In the middle of your forehead.  Behind your nose.  In your cheekbones.  Your sinuses and nasal passages are lined with a stringy fluid (mucus). Mucus normally drains out of your sinuses. When your nasal tissues get inflamed or swollen, the mucus can get trapped or blocked so air cannot flow through your sinuses. This lets bacteria, viruses, and funguses grow, and that leads to infection. Follow these instructions at home: Medicines  Take, use, or apply over-the-counter and prescription medicines only as told by your doctor. These may include nasal sprays.  If you were prescribed an antibiotic medicine, take it as told by your doctor. Do not stop taking the antibiotic even if you start to feel better. Hydrate and Humidify  Drink enough water to keep your pee (urine) clear or pale yellow.  Use a cool mist humidifier to keep the humidity level in your home above 50%.  Breathe in steam for 10-15 minutes, 3-4 times a day or as told by your doctor. You can do this in the bathroom while a hot shower is running.  Try not to spend time in cool or dry air. Rest  Rest as much as possible.  Sleep with your head raised (elevated).  Make sure to get enough sleep each night. General instructions  Put a warm, moist washcloth on your face 3-4 times a day or as told by your doctor. This will help with discomfort.  Wash your hands often with soap and water. If there is no soap and water, use hand sanitizer.  Do  not smoke. Avoid being around people who are smoking (secondhand smoke).  Keep all follow-up visits as told by your doctor. This is important. Contact a doctor if:  You have a fever.  Your symptoms get worse.  Your symptoms do not get better within 10 days. Get help right away if:  You have a very bad headache.  You cannot stop throwing up (vomiting).  You have pain or swelling around your face or eyes.  You have trouble seeing.  You feel confused.  Your neck is stiff.  You have trouble breathing. This information is not intended to replace advice given to you by your health care provider. Make sure you discuss any questions you have with your health care provider. Document Released: 10/09/2007 Document Revised: 12/17/2015 Document Reviewed: 02/15/2015 Elsevier Interactive Patient Education  [employer].

## 2017-12-12 NOTE — Progress Notes (Signed)
BP 118/72   Pulse 99   Temp 98.2 F (36.8 C) (Oral)   Resp 14   Ht 5' 7.5" (1.715 m)   Wt 143 lb 12.8 oz (65.2 kg)   SpO2 95%   BMI 22.19 kg/m    Subjective:    Patient ID: Ebony [name], female    DOB: [birth year], 67 y.o.   MRN: [id number]  HPI: Ebony [name] is a 67 y.o. female  Chief Complaint  Patient presents with  . Sinusitis    HPI  Presents with 7 days of illness starting with bilateral ear itching and fullness and fullness behind the eyes started getting better then had acute worsening- started coughing- some clear sputum and started turning green and yellow. States both eyes are sore- sees opthalmology due to not making tear drops but feels soreness has worsened; Eye doctor seen yesterday states eyes are the same since April. Facial soreness and fullness from behind the eyes; both sides affected; gets dizzy when she turns her head Not really blowing much out; coughing stuff up and stuff draining down the back; no swollen glands; no rash; no fevers; no travel; no visits to NH, hosp, day cares Has tried flonase and afrin, zyrtec and [employer] brand sinus medicine has been taking if every day with no relief of symptoms.   Depression screen [facility] 2/9 12/12/2017 05/19/2017 02/04/2017 12/17/2016 10/11/2015  Decreased Interest 0 0 0 0 0  Down, Depressed, Hopeless 0 0 0 0 1  PHQ - 2 Score 0 0 0 0 1    Relevant past medical, surgical, family and social history reviewed Past Medical History:  Diagnosis Date  . Anemia   . Arthritis    knees, hands  . Borderline diabetes mellitus   . Complication of anesthesia    slow to wake  . Hypercholesterolemia   . Prehypertension 12/17/2016  . Wears dentures    full upper and lower   Past Surgical History:  Procedure Laterality Date  . ABDOMINAL HYSTERECTOMY     complete  . COLONOSCOPY WITH PROPOFOL N/A 05/09/2017   Procedure: COLONOSCOPY WITH PROPOFOL;  Surgeon: Lucilla Lame, MD;  Location: [place];  Service: Endoscopy;   Laterality: N/A;  . EYE SURGERY  08/04/2017  . KNEE SURGERY Bilateral    Family History  Problem Relation Age of Onset  . Hypertension Mother   . Stroke Mother        4 mini strokes  . Thyroid disease Mother   . Kidney failure Father   . Diabetes Father   . Hypertension Father   . Kidney disease Father   . Emphysema Maternal Grandmother   . Diabetes Maternal Grandmother   . Hypertension Maternal Grandmother   . Diabetes Maternal Grandfather   . Alcohol abuse Maternal Grandfather   . Diabetes Paternal Grandmother   . Heart attack Paternal Grandmother   . Emphysema Paternal Grandfather   . Diabetes Paternal Grandfather   . Heart disease Paternal Grandfather   . Thyroid disease Sister   . Vitamin D deficiency Sister   . Arthritis Sister   . COPD Sister   . Fibromyalgia Sister   . Hypertension Sister   . Hyperlipidemia Sister   . Asthma Sister   . Thyroid disease Sister   . Breast cancer Other   . Cancer Neg Hx    Social History   Tobacco Use  . Smoking status: Never Smoker  . Smokeless tobacco: Never Used  Substance Use Topics  . Alcohol  use: No  . Drug use: No    Interim medical history since last visit reviewed. Allergies and medications reviewed  Review of Systems  Constitutional: Negative for chills and fever.  HENT: Positive for postnasal drip, sinus pressure, sinus pain and tinnitus. Negative for congestion, facial swelling, rhinorrhea, sneezing and sore throat.   Respiratory: Positive for cough. Negative for shortness of breath.   Cardiovascular: Negative for chest pain.  Neurological: Positive for dizziness and headaches. Negative for numbness.   Per HPI unless specifically indicated above     Objective:    BP 118/72   Pulse 99   Temp 98.2 F (36.8 C) (Oral)   Resp 14   Ht 5' 7.5" (1.715 m)   Wt 143 lb 12.8 oz (65.2 kg)   SpO2 95%   BMI 22.19 kg/m   Wt Readings from Last 3 Encounters:  12/12/17 143 lb 12.8 oz (65.2 kg)  05/19/17 148 lb  12.8 oz (67.5 kg)  05/09/17 151 lb (68.5 kg)    Physical Exam  Constitutional: She appears well-developed and well-nourished.  HENT:  Nose: Mucosal edema and rhinorrhea present.  Mouth/Throat: Mucous membranes are normal. No posterior oropharyngeal edema or posterior oropharyngeal erythema.  Eyes: EOM are normal. No scleral icterus.  Cardiovascular: Normal rate and regular rhythm.  Pulmonary/Chest: Effort normal and breath sounds normal.  Psychiatric: She has a normal mood and affect. Her behavior is normal.      Assessment & Plan:   Problem List Items Addressed This Visit    None    Visit Diagnoses    Acute recurrent frontal sinusitis    -  Primary   discussed dx, abx, C diff precautions, rest, hydration, reasons to call reivewed    Relevant Medications   doxycycline (VIBRA-TABS) 100 MG tablet   fluticasone (FLONASE) 50 MCG/ACT nasal spray       Follow up plan: No follow-ups on file.  An after-visit summary was printed and given to the patient at [place].  Please see the patient instructions which may contain other information and recommendations beyond what is mentioned above in the assessment and plan.  Meds ordered this encounter  Medications  . doxycycline (VIBRA-TABS) 100 MG tablet    Sig: Take 1 tablet (100 mg total) by mouth 2 (two) times daily.    Dispense:  20 tablet    Refill:  0    Order Specific Question:   Supervising Provider    Answer:   Edge Mauger, Satira Anis [id number]  . fluticasone (FLONASE) 50 MCG/ACT nasal spray    Sig: Place 2 sprays into both nostrils daily.    Dispense:  16 g    Refill:  6    Order Specific Question:   Supervising Provider    Answer:   Muhamad Serano P [id number]    No orders of the defined types were placed in this encounter.

## 2018-01-22 ENCOUNTER — Other Ambulatory Visit: Payer: Self-pay | Admitting: Family Medicine

## 2018-03-11 DIAGNOSIS — H16223 Keratoconjunctivitis sicca, not specified as Sjogren's, bilateral: Secondary | ICD-10-CM | POA: Diagnosis not present

## 2018-03-25 DIAGNOSIS — H16223 Keratoconjunctivitis sicca, not specified as Sjogren's, bilateral: Secondary | ICD-10-CM | POA: Diagnosis not present

## 2018-04-14 DIAGNOSIS — H16223 Keratoconjunctivitis sicca, not specified as Sjogren's, bilateral: Secondary | ICD-10-CM | POA: Diagnosis not present

## 2018-04-21 DIAGNOSIS — H16223 Keratoconjunctivitis sicca, not specified as Sjogren's, bilateral: Secondary | ICD-10-CM | POA: Diagnosis not present

## 2018-04-24 ENCOUNTER — Ambulatory Visit (INDEPENDENT_AMBULATORY_CARE_PROVIDER_SITE_OTHER): Payer: Medicare HMO

## 2018-04-24 VITALS — BP 98/58 | HR 76 | Temp 98.1°F | Resp 16 | Ht 68.0 in | Wt 142.2 lb

## 2018-04-24 DIAGNOSIS — Z0000 Encounter for general adult medical examination without abnormal findings: Secondary | ICD-10-CM | POA: Diagnosis not present

## 2018-04-24 DIAGNOSIS — Z1231 Encounter for screening mammogram for malignant neoplasm of breast: Secondary | ICD-10-CM | POA: Diagnosis not present

## 2018-04-24 DIAGNOSIS — Z780 Asymptomatic menopausal state: Secondary | ICD-10-CM | POA: Diagnosis not present

## 2018-04-24 NOTE — Patient Instructions (Signed)
[name] , Thank you for taking time to come for your Medicare Wellness Visit. I appreciate your ongoing commitment to your health goals. Please review the following plan we discussed and let me know if I can assist you in the future.   Screening recommendations/referrals: Colonoscopy: done 05/09/17 repeat in 10 years Mammogram: done 08/08/17. Please call [phone] to schedule your mammogram and bone density exam.  Recommended yearly ophthalmology/optometry visit for glaucoma screening and checkup Recommended yearly dental visit for hygiene and checkup  Vaccinations: Influenza vaccine: postponed Pneumococcal vaccine: postponed Tdap vaccine: due - please contact us if you get a cut or scrape Shingles vaccine: Shingrix discussed. Please contact your pharmacy for coverage information.     Advanced directives: Advance directive discussed with you today. I have provided a copy for you to complete at home and have notarized. Once this is complete please bring a copy in to our office so we can scan it into your chart.  Conditions/risks identified: Keep up the great work!   Next appointment: Please follow up in one year for your Medicare Annual Wellness visit.     Preventive Care 53 Years and Older, Female Preventive care refers to lifestyle choices and visits with your health care provider that can promote health and wellness. What does preventive care include?  A yearly physical exam. This is also called an annual well check.  Dental exams once or twice a year.  Routine eye exams. Ask your health care provider how often you should have your eyes checked.  Personal lifestyle choices, including:  Daily care of your teeth and gums.  Regular physical activity.  Eating a healthy diet.  Avoiding tobacco and drug use.  Limiting alcohol use.  Practicing safe sex.  Taking low-dose aspirin every day.  Taking vitamin and mineral supplements as recommended by your health care  provider. What happens during an annual well check? The services and screenings done by your health care provider during your annual well check will depend on your age, overall health, lifestyle risk factors, and family history of disease. Counseling  Your health care provider may ask you questions about your:  Alcohol use.  Tobacco use.  Drug use.  Emotional well-being.  Home and relationship well-being.  Sexual activity.  Eating habits.  History of falls.  Memory and ability to understand (cognition).  Work and work Statistician.  Reproductive health. Screening  You may have the following tests or measurements:  Height, weight, and BMI.  Blood pressure.  Lipid and cholesterol levels. These may be checked every 5 years, or more frequently if you are over 77 years old.  Skin check.  Lung cancer screening. You may have this screening every year starting at age 30 if you have a 30-pack-year history of smoking and currently smoke or have quit within the past 15 years.  Fecal occult blood test (FOBT) of the stool. You may have this test every year starting at age 79.  Flexible sigmoidoscopy or colonoscopy. You may have a sigmoidoscopy every 5 years or a colonoscopy every 10 years starting at age 72.  Hepatitis C blood test.  Hepatitis B blood test.  Sexually transmitted disease (STD) testing.  Diabetes screening. This is done by checking your blood sugar (glucose) after you have not eaten for a while (fasting). You may have this done every 1-3 years.  Bone density scan. This is done to screen for osteoporosis. You may have this done starting at age 54.  Mammogram. This may be done  every 1-2 years. Talk to your health care provider about how often you should have regular mammograms. Talk with your health care provider about your test results, treatment options, and if necessary, the need for more tests. Vaccines  Your health care provider may recommend certain  vaccines, such as:  Influenza vaccine. This is recommended every year.  Tetanus, diphtheria, and acellular pertussis (Tdap, Td) vaccine. You may need a Td booster every 10 years.  Zoster vaccine. You may need this after age 68.  Pneumococcal 13-valent conjugate (PCV13) vaccine. One dose is recommended after age 23.  Pneumococcal polysaccharide (PPSV23) vaccine. One dose is recommended after age 20. Talk to your health care provider about which screenings and vaccines you need and how often you need them. This information is not intended to replace advice given to you by your health care provider. Make sure you discuss any questions you have with your health care provider. Document Released: 05/19/2015 Document Revised: 01/10/2016 Document Reviewed: 02/21/2015 Elsevier Interactive Patient Education  2017 [place] Prevention in the Home Falls can cause injuries. They can happen to people of all ages. There are many things you can do to make your home safe and to help prevent falls. What can I do on the outside of my home?  Regularly fix the edges of walkways and driveways and fix any cracks.  Remove anything that might make you trip as you walk through a door, such as a raised step or threshold.  Trim any bushes or trees on the path to your home.  Use bright outdoor lighting.  Clear any walking paths of anything that might make someone trip, such as rocks or tools.  Regularly check to see if handrails are loose or broken. Make sure that both sides of any steps have handrails.  Any raised decks and porches should have guardrails on the edges.  Have any leaves, snow, or ice cleared regularly.  Use sand or salt on walking paths during winter.  Clean up any spills in your garage right away. This includes oil or grease spills. What can I do in the bathroom?  Use night lights.  Install grab bars by the toilet and in the tub and shower. Do not use towel bars as grab  bars.  Use non-skid mats or decals in the tub or shower.  If you need to sit down in the shower, use a plastic, non-slip stool.  Keep the floor dry. Clean up any water that spills on the floor as soon as it happens.  Remove soap buildup in the tub or shower regularly.  Attach bath mats securely with double-sided non-slip rug tape.  Do not have throw rugs and other things on the floor that can make you trip. What can I do in the bedroom?  Use night lights.  Make sure that you have a light by your bed that is easy to reach.  Do not use any sheets or blankets that are too big for your bed. They should not hang down onto the floor.  Have a firm chair that has side arms. You can use this for support while you get dressed.  Do not have throw rugs and other things on the floor that can make you trip. What can I do in the kitchen?  Clean up any spills right away.  Avoid walking on wet floors.  Keep items that you use a lot in easy-to-reach places.  If you need to reach something above you, use a  strong step stool that has a grab bar.  Keep electrical cords out of the way.  Do not use floor polish or wax that makes floors slippery. If you must use wax, use non-skid floor wax.  Do not have throw rugs and other things on the floor that can make you trip. What can I do with my stairs?  Do not leave any items on the stairs.  Make sure that there are handrails on both sides of the stairs and use them. Fix handrails that are broken or loose. Make sure that handrails are as long as the stairways.  Check any carpeting to make sure that it is firmly attached to the stairs. Fix any carpet that is loose or worn.  Avoid having throw rugs at the top or bottom of the stairs. If you do have throw rugs, attach them to the floor with carpet tape.  Make sure that you have a light switch at the top of the stairs and the bottom of the stairs. If you do not have them, ask someone to add them for  you. What else can I do to help prevent falls?  Wear shoes that:  Do not have high heels.  Have rubber bottoms.  Are comfortable and fit you well.  Are closed at the toe. Do not wear sandals.  If you use a stepladder:  Make sure that it is fully opened. Do not climb a closed stepladder.  Make sure that both sides of the stepladder are locked into place.  Ask someone to hold it for you, if possible.  Clearly mark and make sure that you can see:  Any grab bars or handrails.  First and last steps.  Where the edge of each step is.  Use tools that help you move around (mobility aids) if they are needed. These include:  Canes.  Walkers.  Scooters.  Crutches.  Turn on the lights when you go into a dark area. Replace any light bulbs as soon as they burn out.  Set up your furniture so you have a clear path. Avoid moving your furniture around.  If any of your floors are uneven, fix them.  If there are any pets around you, be aware of where they are.  Review your medicines with your doctor. Some medicines can make you feel dizzy. This can increase your chance of falling. Ask your doctor what other things that you can do to help prevent falls. This information is not intended to replace advice given to you by your health care provider. Make sure you discuss any questions you have with your health care provider. Document Released: 02/16/2009 Document Revised: 09/28/2015 Document Reviewed: 05/27/2014 Elsevier Interactive Patient Education  2017 [employer].

## 2018-04-24 NOTE — Progress Notes (Signed)
Subjective:   Ebony [name] is a 67 y.o. female who presents for Medicare Annual (Subsequent) preventive examination.  Review of Systems:   Cardiac Risk Factors include: advanced age (>58men, >23 women)     Objective:     Vitals: BP (!) 98/58 (BP Location: Left Arm, Patient Position: Sitting, Cuff Size: Normal)   Pulse 76   Temp 98.1 F (36.7 C) (Oral)   Resp 16   Ht 5\' 8"  (1.727 m)   Wt 142 lb 3.2 oz (64.5 kg)   SpO2 97%   BMI 21.62 kg/m   Body mass index is 21.62 kg/m.  Advanced Directives 04/24/2018 05/09/2017 02/04/2017 12/17/2016 10/11/2015 10/09/2015  Does Patient Have a Medical Advance Directive? No No No No No No  Would patient like information on creating a medical advance directive? Yes (MAU/Ambulatory/Procedural Areas - Information given) Yes (MAU/Ambulatory/Procedural Areas - Information given) - - - No - patient declined information    Tobacco Social History   Tobacco Use  Smoking Status Never Smoker  Smokeless Tobacco Never Used     Counseling given: Not Answered   Clinical Intake:  Pre-visit preparation completed: Yes  Pain : 0-10 Pain Score: 3  Pain Type: (arthritis) Pain Location: Knee Pain Orientation: Right, Left Pain Descriptors / Indicators: Aching Pain Onset: More than a month ago Pain Frequency: Constant Pain Relieving Factors: otc pain relief, tylenol and aleve  Pain Relieving Factors: otc pain relief, tylenol and aleve  Nutritional Status: BMI of 19-24  Normal Nutritional Risks: None Diabetes: No  How often do you need to have someone help you when you read instructions, pamphlets, or other written materials from your doctor or pharmacy?: 1 - Never What is the last grade level you completed in school?: 12th grade  Interpreter Needed?: No  Information entered by :: Clemetine Marker LPN  Past Medical History:  Diagnosis Date  . Anemia   . Arthritis    knees, hands  . Borderline diabetes mellitus   . Complication of anesthesia    slow to wake  . Dry eye syndrome of both eyes   . Hypercholesterolemia   . Prehypertension 12/17/2016  . Wears dentures    full upper and lower   Past Surgical History:  Procedure Laterality Date  . ABDOMINAL HYSTERECTOMY     complete  . COLONOSCOPY WITH PROPOFOL N/A 05/09/2017   Procedure: COLONOSCOPY WITH PROPOFOL;  Surgeon: Lucilla Lame, MD;  Location: [facility];  Service: Endoscopy;  Laterality: N/A;  . EYE SURGERY  08/04/2017  . KNEE SURGERY Bilateral    Family History  Problem Relation Age of Onset  . Hypertension Mother   . Stroke Mother        4 mini strokes  . Thyroid disease Mother   . Kidney failure Father   . Diabetes Father   . Hypertension Father   . Kidney disease Father   . Emphysema Maternal Grandmother   . Diabetes Maternal Grandmother   . Hypertension Maternal Grandmother   . Diabetes Maternal Grandfather   . Alcohol abuse Maternal Grandfather   . Diabetes Paternal Grandmother   . Heart attack Paternal Grandmother   . Emphysema Paternal Grandfather   . Diabetes Paternal Grandfather   . Heart disease Paternal Grandfather   . Thyroid disease Sister   . Vitamin D deficiency Sister   . Arthritis Sister   . COPD Sister   . Fibromyalgia Sister   . Hypertension Sister   . Hyperlipidemia Sister   . Asthma Sister   .  Thyroid disease Sister   . Breast cancer Other   . Cancer Neg Hx    Social History   Socioeconomic History  . Marital status: Married    Spouse name: Not on file  . Number of children: 3  . Years of education: Not on file  . Highest education level: High school graduate  Occupational History  . Not on file  Social Needs  . Financial resource strain: Not hard at all  . Food insecurity:    Worry: Never true    Inability: Never true  . Transportation needs:    Medical: No    Non-medical: No  Tobacco Use  . Smoking status: Never Smoker  . Smokeless tobacco: Never Used  Substance and Sexual Activity  . Alcohol use: No  .  Drug use: No  . Sexual activity: Not Currently  Lifestyle  . Physical activity:    Days per week: 7 days    Minutes per session: 60 min  . Stress: Not at all  Relationships  . Social connections:    Talks on phone: More than three times a week    Gets together: More than three times a week    Attends religious service: More than 4 times per year    Active member of club or organization: No    Attends meetings of clubs or organizations: Never    Relationship status: Married  Other Topics Concern  . Not on file  Social History Narrative  . Not on file    Outpatient Encounter Medications as of 04/24/2018  Medication Sig  . acetaminophen (TYLENOL 8 HOUR) 650 MG CR tablet Take 650 mg by mouth every 8 (eight) hours as needed for pain.  Marland Kitchen aspirin 81 MG EC tablet Take 81 mg by mouth daily. Swallow whole.  . B Complex-C (SUPER B COMPLEX PO) Take 1 tablet by mouth daily.  . Calcium Carbonate-Vit D-Min (CALCIUM 1200 PO) Take 600 mg by mouth 2 (two) times daily.  Rolena Infante [place] 450 MG CAPS Take 450 mg by mouth daily as needed.   . Cinnamon 500 MG TABS Take 1,000 mg by mouth daily.   . Cyanocobalamin (VITAMIN B-12 SL) Place under the tongue daily.  . famotidine (PEPCID) 20 MG tablet Take 1 tablet (20 mg total) by mouth 2 (two) times daily.  . fluticasone (FLONASE) 50 MCG/ACT nasal spray Place 2 sprays into both nostrils daily.  . Homeopathic Products ([employer] LEG CRAMPS PAIN RELIEF PO) Take 1 tablet by mouth daily as needed.  . hydrOXYzine (ATARAX/VISTARIL) 10 MG tablet Take 1 tablet (10 mg total) by mouth 3 (three) times daily as needed. For anxiety or itching (Patient taking differently: Take 10 mg by mouth 3 (three) times daily as needed (pt takes very sparingly). For anxiety or itching)  . Lifitegrast (XIIDRA) 5 % SOLN Apply 1 drop to eye 2 (two) times daily.  . naproxen sodium (ANAPROX) 220 MG tablet Take 220 mg by mouth 2 (two) times daily as needed. Take 1 hour or more after aspirin  .  triamcinolone cream (KENALOG) 0.1 % APPLY TO AFFECTED AREA TWICE A DAY  . TURMERIC CURCUMIN PO Take 1 capsule by mouth 3 (three) times a week.  . doxycycline (VIBRA-TABS) 100 MG tablet Take 1 tablet (100 mg total) by mouth 2 (two) times daily. (Patient not taking: Reported on 04/24/2018)  . scopolamine (TRANSDERM-SCOP, 1.5 MG,) 1 MG/3DAYS Place 1 patch (1.5 mg total) onto the skin every 3 (three) days. (Patient not taking:  Reported on 12/12/2017)   No facility-administered encounter medications on file as of 04/24/2018.     Activities of Daily Living In your present state of health, do you have any difficulty performing the following activities: 04/24/2018 12/12/2017  Hearing? N N  Comment declines hearing aids -  Vision? N N  Comment wears glasses -  Difficulty concentrating or making decisions? N N  Walking or climbing stairs? N N  Dressing or bathing? N N  Doing errands, shopping? N N  Preparing Food and eating ? N -  Using the Toilet? N -  In the past six months, have you accidently leaked urine? Y -  Comment occaisonal bladder leakage, wears pads for protection -  Do you have problems with loss of bowel control? N -  Managing your Medications? N -  Managing your Finances? N -  Housekeeping or managing your Housekeeping? N -  Some recent data might be hidden    Patient Care Team: Lada, Satira Anis, MD as PCP - General (Family Medicine)    Assessment:   This is a routine wellness examination for Ziona.  Exercise Activities and Dietary recommendations Current Exercise Habits: Home exercise routine, Time (Minutes): 60, Frequency (Times/Week): 7, Weekly Exercise (Minutes/Week): 420, Intensity: Mild, Exercise limited by: None identified  Goals    . DIET - INCREASE WATER INTAKE     Recommend water intake of 6-8 glasses of water.     . Reduce sugar intake (pt-stated)     She will try to reduce sugar intake       Fall Risk Fall Risk  04/24/2018 12/12/2017 05/19/2017 02/04/2017  12/17/2016  Falls in the past year? 0 No No No No  Number falls in past yr: 0 - - - -   FALL RISK PREVENTION PERTAINING TO THE HOME:  Any stairs in or around the home WITH handrails? Yes  Home free of loose throw rugs in walkways, pet beds, electrical cords, etc? Yes  Adequate lighting in your home to reduce risk of falls? Yes   ASSISTIVE DEVICES UTILIZED TO PREVENT FALLS:  Life alert? No  Use of a cane, walker or w/c? No  Grab bars in the bathroom? Yes  Shower chair or bench in shower? No  Elevated toilet seat or a handicapped toilet? No   DME ORDERS:  DME order needed?  No   TIMED UP AND GO:  Was the test performed? Yes .  Length of time to ambulate 10 feet: 5 sec.   GAIT:  Appearance of gait: Gait stead-fast and without the use of an assistive device.   Education: Fall risk prevention has been discussed.  Intervention(s) required? No   Depression Screen PHQ 2/9 Scores 04/24/2018 12/12/2017 05/19/2017 02/04/2017  PHQ - 2 Score 0 0 0 0     Cognitive Function     6CIT Screen 04/24/2018 02/04/2017  What Year? 0 points 0 points  What month? 0 points 0 points  What time? 0 points 0 points  Count back from 20 0 points 0 points  Months in reverse 0 points 0 points  Repeat phrase 2 points 2 points  Total Score 2 2     There is no immunization history on file for this patient.  Qualifies for Shingles Vaccine? Yes . Due for Shingrix. Education has been provided regarding the importance of this vaccine. Pt has been advised to call insurance company to determine out of pocket expense. Advised may also receive vaccine at local pharmacy or Health Dept.  Verbalized acceptance and understanding.  Tdap: Although this vaccine is not a covered service during a Wellness Exam, does the patient still wish to receive this vaccine today?  No .  Education has been provided regarding the importance of this vaccine. Advised may receive this vaccine at local pharmacy or Health Dept. Aware to  provide a copy of the vaccination record if obtained from local pharmacy or Health Dept. Verbalized acceptance and understanding.  Flu Vaccine: Due for Flu vaccine. Does the patient want to receive this vaccine today?  No . Education has been provided regarding the importance of this vaccine but still declined. Advised may receive this vaccine at local pharmacy or Health Dept. Aware to provide a copy of the vaccination record if obtained from local pharmacy or Health Dept. Verbalized acceptance and understanding.  Pneumococcal Vaccine: Due for Pneumococcal vaccine. Does the patient want to receive this vaccine today?  No . Education has been provided regarding the importance of this vaccine but still declined. Advised may receive this vaccine at local pharmacy or Health Dept. Aware to provide a copy of the vaccination record if obtained from local pharmacy or Health Dept. Verbalized acceptance and understanding.  Screening Tests Health Maintenance  Topic Date Due  . TETANUS/TDAP  05/19/2018 (Originally 09/18/1969)  . PNA vac Low Risk Adult (1 of 2 - PCV13) 05/19/2018 (Originally 09/19/2014)  . INFLUENZA VACCINE  12/05/2018 (Originally 12/04/2017)  . MAMMOGRAM  08/05/2018  . COLONOSCOPY  05/10/2027  . DEXA SCAN  Completed  . Hepatitis C Screening  Completed    Cancer Screenings:  Colorectal Screening: Completed 05/09/17. Repeat every 10 years;   Mammogram: Completed 08/08/17. Repeat every year;   Bone Density: Completed 2007. Results unavailable. Repeat every 2 years. Ordered today. Pt provided with contact information and advised to call to schedule appt.   Lung Cancer Screening: (Low Dose CT Chest recommended if Age 73-80 years, 30 pack-year currently smoking OR have quit w/in 15years.) does not qualify.   Additional Screening:  Hepatitis C Screening: does qualify; Completed 07/25/15  Vision Screening: Recommended annual ophthalmology exams for early detection of glaucoma and other disorders  of the eye. Is the patient up to date with their annual eye exam?  Yes  Who is the provider or what is the name of the office in which the pt attends annual eye exams? [facility]  Dental Screening: Recommended annual dental exams for proper oral hygiene  Community Resource Referral:  CRR required this visit?  No      Plan:    I have personally reviewed and addressed the Medicare Annual Wellness questionnaire and have noted the following in the patient's chart:  A. Medical and social history B. Use of alcohol, tobacco or illicit drugs  C. Current medications and supplements D. Functional ability and status E.  Nutritional status F.  Physical activity G. Advance directives H. List of other physicians I.  Hospitalizations, surgeries, and ER visits in previous 12 months J.  [place] such as hearing and vision if needed, cognitive and depression L. Referrals and appointments   In addition, I have reviewed and discussed with patient certain preventive protocols, quality metrics, and best practice recommendations. A written personalized care plan for preventive services as well as general preventive health recommendations were provided to patient.   Signed,  Clemetine Marker, LPN Nurse Health Advisor   Nurse Notes: pt doing well and appreciative of visit today. Advised to schedule routine follow up for office  visit with Dr. Sanda Klein due to only seen for problems visits this year.

## 2018-05-11 ENCOUNTER — Other Ambulatory Visit
Admission: RE | Admit: 2018-05-11 | Discharge: 2018-05-11 | Disposition: A | Discharge status: Home / Self Care | Payer: Medicare HMO | Source: Ambulatory Visit | Attending: Ophthalmology | Admitting: Ophthalmology

## 2018-05-11 DIAGNOSIS — H16002 Unspecified corneal ulcer, left eye: Secondary | ICD-10-CM | POA: Diagnosis not present

## 2018-05-11 DIAGNOSIS — H16222 Keratoconjunctivitis sicca, not specified as Sjogren's, left eye: Secondary | ICD-10-CM | POA: Diagnosis not present

## 2018-05-12 DIAGNOSIS — H16002 Unspecified corneal ulcer, left eye: Secondary | ICD-10-CM | POA: Diagnosis not present

## 2018-05-13 DIAGNOSIS — H16002 Unspecified corneal ulcer, left eye: Secondary | ICD-10-CM | POA: Diagnosis not present

## 2018-05-14 DIAGNOSIS — H16002 Unspecified corneal ulcer, left eye: Secondary | ICD-10-CM | POA: Diagnosis not present

## 2018-05-15 DIAGNOSIS — H16002 Unspecified corneal ulcer, left eye: Secondary | ICD-10-CM | POA: Diagnosis not present

## 2018-05-15 LAB — EYE CULTURE

## 2018-05-18 ENCOUNTER — Ambulatory Visit: Payer: Medicare HMO | Admitting: Family Medicine

## 2018-05-18 DIAGNOSIS — H16002 Unspecified corneal ulcer, left eye: Secondary | ICD-10-CM | POA: Diagnosis not present

## 2018-05-20 DIAGNOSIS — H16002 Unspecified corneal ulcer, left eye: Secondary | ICD-10-CM | POA: Diagnosis not present

## 2018-05-21 DIAGNOSIS — H16002 Unspecified corneal ulcer, left eye: Secondary | ICD-10-CM | POA: Diagnosis not present

## 2018-05-22 ENCOUNTER — Telehealth: Payer: Self-pay | Admitting: Family Medicine

## 2018-05-22 DIAGNOSIS — H16002 Unspecified corneal ulcer, left eye: Secondary | ICD-10-CM | POA: Diagnosis not present

## 2018-05-22 NOTE — Telephone Encounter (Signed)
Just an FYI: Pt stopped by and wanted to inform you that she will schedule an appt as soon as she can. She has to see another doctor everyday due to her having a hole in her eye. She can barely see. And the times with this doctor fluctuantes everyday

## 2018-05-25 DIAGNOSIS — H16002 Unspecified corneal ulcer, left eye: Secondary | ICD-10-CM | POA: Diagnosis not present

## 2018-05-28 DIAGNOSIS — H16002 Unspecified corneal ulcer, left eye: Secondary | ICD-10-CM | POA: Diagnosis not present

## 2018-06-03 DIAGNOSIS — H16002 Unspecified corneal ulcer, left eye: Secondary | ICD-10-CM | POA: Diagnosis not present

## 2018-06-03 LAB — CULTURE, FUNGUS WITHOUT SMEAR

## 2018-06-10 DIAGNOSIS — H16002 Unspecified corneal ulcer, left eye: Secondary | ICD-10-CM | POA: Diagnosis not present

## 2018-06-17 ENCOUNTER — Other Ambulatory Visit: Payer: Self-pay | Admitting: Family Medicine

## 2018-06-24 DIAGNOSIS — H16002 Unspecified corneal ulcer, left eye: Secondary | ICD-10-CM | POA: Diagnosis not present

## 2018-07-01 ENCOUNTER — Encounter: Payer: Self-pay | Admitting: Family Medicine

## 2018-07-01 ENCOUNTER — Ambulatory Visit (INDEPENDENT_AMBULATORY_CARE_PROVIDER_SITE_OTHER): Payer: Medicare HMO | Admitting: Family Medicine

## 2018-07-01 VITALS — BP 108/62 | HR 73 | Temp 97.9°F | Resp 12 | Ht 68.0 in | Wt 146.4 lb

## 2018-07-01 DIAGNOSIS — E782 Mixed hyperlipidemia: Secondary | ICD-10-CM | POA: Diagnosis not present

## 2018-07-01 DIAGNOSIS — Z2821 Immunization not carried out because of patient refusal: Secondary | ICD-10-CM

## 2018-07-01 DIAGNOSIS — Z5181 Encounter for therapeutic drug level monitoring: Secondary | ICD-10-CM

## 2018-07-01 DIAGNOSIS — R7303 Prediabetes: Secondary | ICD-10-CM | POA: Insufficient documentation

## 2018-07-01 DIAGNOSIS — M170 Bilateral primary osteoarthritis of knee: Secondary | ICD-10-CM

## 2018-07-01 DIAGNOSIS — D709 Neutropenia, unspecified: Secondary | ICD-10-CM | POA: Diagnosis not present

## 2018-07-01 DIAGNOSIS — E785 Hyperlipidemia, unspecified: Secondary | ICD-10-CM | POA: Insufficient documentation

## 2018-07-01 MED ORDER — DICLOFENAC SODIUM 1 % TD GEL: TRANSDERMAL | 3 refills | Status: DC

## 2018-07-01 NOTE — Patient Instructions (Addendum)
We'll get your labs today If you have not heard anything from my staff in a week about any orders/referrals/studies from today, please contact us here to follow-up [phone]  Try the Voltaren gel (diclofenac) for your knee arthritis If you have any reaction to it, stop it and let me know  Try to limit saturated fats in your diet (bologna, hot dogs, barbeque, cheeseburgers, hamburgers, steak, bacon, sausage, cheese, etc.) and get more fresh fruits, vegetables, and whole grains   Prediabetes Eating Plan Prediabetes is a condition that causes blood sugar (glucose) levels to be higher than normal. This increases the risk for developing diabetes. In order to prevent diabetes from developing, your health care provider may recommend a diet and other lifestyle changes to help you:  Control your blood glucose levels.  Improve your cholesterol levels.  Manage your blood pressure. Your health care provider may recommend working with a diet and nutrition specialist (dietitian) to make a meal plan that is best for you. What are tips for following this plan? Lifestyle  Set weight loss goals with the help of your health care team. It is recommended that most people with prediabetes lose 7% of their current body weight.  Exercise for at least 30 minutes at least 5 days a week.  Attend a support group or seek ongoing support from a mental health counselor.  Take over-the-counter and prescription medicines only as told by your health care provider. Reading food labels  Read food labels to check the amount of fat, salt (sodium), and sugar in prepackaged foods. Avoid foods that have: ? Saturated fats. ? Trans fats. ? Added sugars.  Avoid foods that have more than 300 milligrams (mg) of sodium per serving. Limit your daily sodium intake to less than 2,300 mg each day. Shopping  Avoid buying pre-made and processed foods. Cooking  Cook with olive oil. Do not use butter, lard, or ghee.  Bake,  broil, grill, or boil foods. Avoid frying. Meal planning   Work with your dietitian to develop an eating plan that is right for you. This may include: ? Tracking how many calories you take in. Use a food diary, notebook, or mobile application to track what you eat at each meal. ? Using the glycemic index (GI) to plan your meals. The index tells you how quickly a food will raise your blood glucose. Choose low-GI foods. These foods take a longer time to raise blood glucose.  Consider following a Mediterranean diet. This diet includes: ? Several servings each day of fresh fruits and vegetables. ? Eating fish at least twice a week. ? Several servings each day of whole grains, beans, nuts, and seeds. ? Using olive oil instead of other fats. ? Moderate alcohol consumption. ? Eating small amounts of red meat and whole-fat dairy.  If you have high blood pressure, you may need to limit your sodium intake or follow a diet such as the DASH eating plan. DASH is an eating plan that aims to lower high blood pressure. What foods are recommended? The items listed below may not be a complete list. Talk with your dietitian about what dietary choices are best for you. Grains Whole grains, such as whole-wheat or whole-grain breads, crackers, cereals, and pasta. Unsweetened oatmeal. Bulgur. Barley. Quinoa. Brown rice. Corn or whole-wheat flour tortillas or taco shells. Vegetables Lettuce. Spinach. Peas. Beets. Cauliflower. Cabbage. Broccoli. Carrots. Tomatoes. Squash. Eggplant. Herbs. Peppers. Onions. Cucumbers. Brussels sprouts. Fruits Berries. Bananas. Apples. Oranges. Grapes. Papaya. Mango. Pomegranate. Kiwi. Grapefruit.  Cherries. Meats and other protein foods Seafood. Poultry without skin. Lean cuts of pork and beef. Tofu. Eggs. Nuts. Beans. Dairy Low-fat or fat-free dairy products, such as yogurt, cottage cheese, and cheese. Beverages Water. Tea. Coffee. Sugar-free or diet soda. Seltzer water. Lowfat  or no-fat milk. Milk alternatives, such as soy or almond milk. Fats and oils Olive oil. Canola oil. Sunflower oil. Grapeseed oil. Avocado. Walnuts. Sweets and desserts Sugar-free or low-fat pudding. Sugar-free or low-fat ice cream and other frozen treats. Seasoning and other foods Herbs. Sodium-free spices. Mustard. Relish. Low-fat, low-sugar ketchup. Low-fat, low-sugar barbecue sauce. Low-fat or fat-free mayonnaise. What foods are not recommended? The items listed below may not be a complete list. Talk with your dietitian about what dietary choices are best for you. Grains Refined white flour and flour products, such as bread, pasta, snack foods, and cereals. Vegetables Canned vegetables. Frozen vegetables with butter or cream sauce. Fruits Fruits canned with syrup. Meats and other protein foods Fatty cuts of meat. Poultry with skin. Breaded or fried meat. Processed meats. Dairy Full-fat yogurt, cheese, or milk. Beverages Sweetened drinks, such as sweet iced tea and soda. Fats and oils Butter. Lard. Ghee. Sweets and desserts Baked goods, such as cake, cupcakes, pastries, cookies, and cheesecake. Seasoning and other foods Spice mixes with added salt. Ketchup. Barbecue sauce. Mayonnaise. Summary  To prevent diabetes from developing, you may need to make diet and other lifestyle changes to help control blood sugar, improve cholesterol levels, and manage your blood pressure.  Set weight loss goals with the help of your health care team. It is recommended that most people with prediabetes lose 7 percent of their current body weight.  Consider following a Mediterranean diet that includes plenty of fresh fruits and vegetables, whole grains, beans, nuts, seeds, fish, lean meat, low-fat dairy, and healthy oils. This information is not intended to replace advice given to you by your health care provider. Make sure you discuss any questions you have with your health care provider. Document  Released: 09/06/2014 Document Revised: 06/26/2016 Document Reviewed: 06/26/2016 Elsevier Interactive Patient Education  2019 [employer].

## 2018-07-01 NOTE — Assessment & Plan Note (Addendum)
Check glucose and A1c; see AVS for dietary recommendations

## 2018-07-01 NOTE — Assessment & Plan Note (Signed)
Will start voltaren gel (she requested); should be going OTC soon; Rx sent for now

## 2018-07-01 NOTE — Assessment & Plan Note (Signed)
Reviewed last WBC; no recent infections; check CBC with diff

## 2018-07-01 NOTE — Progress Notes (Signed)
BP 108/62   Pulse 73   Temp 97.9 F (36.6 C) (Oral)   Resp 12   Ht 5\' 8"  (1.727 m)   Wt 146 lb 6.4 oz (66.4 kg)   SpO2 97%   BMI 22.26 kg/m    Subjective:    Patient ID: Ebony [name], female    DOB: December 18, 1950, 68 y.o.   MRN: [id number]  HPI: Ebony [name] is a 68 y.o. female  Chief Complaint  Patient presents with  . 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; since April; stopped making tears; went to the eye doctor and found the tears are stopped up and has tubes in her nasolacrimal ducts on both sides She was closing the eye and patching it for a while Going back now from every 2 weeks to every 3 weeks now  Low white blood cell count; no boils; having some nasal congestion; no fevers; no cough; no dysuria  Slightly elevated blood sugars; diabetes runs in the family on both sides; some dry mouth, using lemon drops; last A1c was 5.8 in 2018  High cholesterol; last LDL 130; not sure if anyone has that in the family mother might be taking cholesterol medicine  She does not want a pneumonia vaccine; it might cause more trouble than she has already Does not want flu shot or tetanus shot Colonoscopy UTD; Jan 2019; no fam hx; next in 10 years per report (reviewed personally in procedures)  Bad knee arthritis; ibuprofen does not help; no allergies to it, tolerates it just fine, it just doesn't help; I asked especially since the celecoxib is listed as an allergy   Depression screen [facility] 2/9 07/01/2018 04/24/2018 12/12/2017 05/19/2017 02/04/2017  Decreased Interest 0 0 0 0 0  Down, Depressed, Hopeless 0 0 0 0 0  PHQ - 2 Score 0 0 0 0 0  Altered sleeping 0 - - - -  Tired, decreased energy 0 - - - -  Change in appetite 0 - - - -  Feeling bad or failure about yourself  0 - - - -  Trouble concentrating 0 - - - -  Moving slowly or fidgety/restless 0 - - - -  Suicidal thoughts 0 - - - -  PHQ-9 Score 0 - - - -   Fall Risk   07/01/2018 04/24/2018 12/12/2017 05/19/2017 02/04/2017  Falls in the past year? 0 0 No No No  Number falls in past yr: 0 0 - - -  Injury with Fall? 0 - - - -    Relevant past medical, surgical, family and social history reviewed Past Medical History:  Diagnosis Date  . Anemia   . Arthritis    knees, hands  . Borderline diabetes mellitus   . Complication of anesthesia    slow to wake  . Dry eye syndrome of both eyes   . Hypercholesterolemia   . Prehypertension 12/17/2016  . Wears dentures    full upper and lower   Past Surgical History:  Procedure Laterality Date  . ABDOMINAL HYSTERECTOMY     complete  . COLONOSCOPY WITH PROPOFOL N/A 05/09/2017   Procedure: COLONOSCOPY WITH PROPOFOL;  Surgeon: Lucilla Lame, MD;  Location: [facility];  Service: Endoscopy;  Laterality: N/A;  . EYE SURGERY  08/04/2017  . KNEE SURGERY Bilateral    Family History  Problem Relation Age of Onset  . Hypertension Mother   . Stroke  Mother        4 mini strokes  . Thyroid disease Mother   . Kidney failure Father   . Diabetes Father   . Hypertension Father   . Kidney disease Father   . Emphysema Maternal Grandmother   . Diabetes Maternal Grandmother   . Hypertension Maternal Grandmother   . Diabetes Maternal Grandfather   . Alcohol abuse Maternal Grandfather   . Diabetes Paternal Grandmother   . Heart attack Paternal Grandmother   . Emphysema Paternal Grandfather   . Diabetes Paternal Grandfather   . Heart disease Paternal Grandfather   . Thyroid disease Sister   . Vitamin D deficiency Sister   . Arthritis Sister   . COPD Sister   . Fibromyalgia Sister   . Hypertension Sister   . Hyperlipidemia Sister   . Asthma Sister   . Thyroid disease Sister   . Breast cancer Other   . Cancer Neg Hx    Social History   Tobacco Use  . Smoking status: Never Smoker  . Smokeless tobacco: Never Used  Substance Use Topics  . Alcohol use: No  . Drug use: No     Office Visit from 07/01/2018 in  [facility]  AUDIT-C Score  0      Interim medical history since last visit reviewed. Allergies and medications reviewed  Review of Systems Per HPI unless specifically indicated above     Objective:    BP 108/62   Pulse 73   Temp 97.9 F (36.6 C) (Oral)   Resp 12   Ht 5\' 8"  (1.727 m)   Wt 146 lb 6.4 oz (66.4 kg)   SpO2 97%   BMI 22.26 kg/m   Wt Readings from Last 3 Encounters:  07/01/18 146 lb 6.4 oz (66.4 kg)  04/24/18 142 lb 3.2 oz (64.5 kg)  12/12/17 143 lb 12.8 oz (65.2 kg)    Physical Exam Constitutional:      General: She is not in acute distress.    Appearance: She is well-developed. She is not diaphoretic.  HENT:     Head: Normocephalic and atraumatic.  Eyes:     General: No scleral icterus. Neck:     Thyroid: No thyromegaly.  Cardiovascular:     Rate and Rhythm: Normal rate and regular rhythm.     Heart sounds: Normal heart sounds. No murmur.  Pulmonary:     Effort: Pulmonary effort is normal. No respiratory distress.     Breath sounds: Normal breath sounds. No wheezing.  Abdominal:     General: Bowel sounds are normal. There is no distension.     Palpations: Abdomen is soft.  Musculoskeletal:     Right knee: She exhibits decreased range of motion and deformity.     Left knee: She exhibits decreased range of motion and deformity.     Comments: Crepitus with active flexion and extension in both knees  Skin:    General: Skin is warm and dry.     Coloration: Skin is not pale.  Neurological:     Mental Status: She is alert.  Psychiatric:        Mood and Affect: Mood is not anxious or depressed.        Behavior: Behavior normal.        Thought Content: Thought content normal.        Judgment: Judgment normal.     Results for orders placed or performed during the hospital encounter of 05/11/18  Eye culture  Result Value Ref Range   Specimen Description      EYE Performed at [facility], [address]  [place], [place] [address]    Special Requests      ONLY PLATES SENT NO SLIDES OR SPECIMEN Performed at [facility], [place] [address], [place], [place] [address]    Culture STAPHYLOCOCCUS AUREUS (A)    Report Status 05/15/2018 FINAL    Organism ID, Bacteria STAPHYLOCOCCUS AUREUS       Susceptibility   Staphylococcus aureus - MIC*    CIPROFLOXACIN <=0.5 SENSITIVE Sensitive     ERYTHROMYCIN <=0.25 SENSITIVE Sensitive     GENTAMICIN <=0.5 SENSITIVE Sensitive     OXACILLIN 0.5 SENSITIVE Sensitive     TETRACYCLINE <=1 SENSITIVE Sensitive     VANCOMYCIN <=0.5 SENSITIVE Sensitive     TRIMETH/SULFA <=10 SENSITIVE Sensitive     CLINDAMYCIN <=0.25 SENSITIVE Sensitive     RIFAMPIN <=0.5 SENSITIVE Sensitive     Inducible Clindamycin NEGATIVE Sensitive     * STAPHYLOCOCCUS AUREUS  Culture, fungus without smear  Result Value Ref Range   Specimen Description      EYE Performed at [facility], [address] [place], [place] [address]    Special Requests      NONE Performed at [facility], [address] [place], [place] [address]    Culture      NO FUNGUS ISOLATED AFTER 21 DAYS Performed at [facility], [place] [address], [place], [place] [address]    Report Status 06/03/2018 FINAL       Assessment & Plan:   Problem List Items Addressed This Visit      Musculoskeletal and Integument   Arthritis of knee, degenerative - Primary    Will start voltaren gel (she requested); should be going OTC soon; Rx sent for now        Other   Prediabetes (Chronic)    Check glucose and A1c; see AVS for dietary recommendations      Relevant Orders   Hemoglobin A1c   Neutropenia (HCC) (Chronic)    Reviewed last WBC; no recent infections; check CBC with diff      Relevant Orders   CBC with Differential/Platelet   Medication monitoring encounter    Check liver and kidneys      Relevant Orders   COMPLETE METABOLIC PANEL WITH GFR   Hyperlipidemia (Chronic)     Check lipids today; limit saturated fats (food from cows and pigs)      Relevant Orders   Lipid panel    Other Visit Diagnoses    Pneumococcal vaccination declined by patient       recommended to patient, explained vitamins won't prevent pneumonia; she still declines   Influenza vaccination declined by patient       recommended to patient, she still declines       Follow up plan: Return in about 6 months (around 12/30/2018) for follow-up visit with Dr. Sanda Klein or after; Medicare visit with [facility] when due.  An after-visit summary was printed and given to the patient at [facility].  Please see the patient instructions which may contain other information and recommendations beyond what is mentioned above in the assessment and plan.  Meds ordered this encounter  Medications  . diclofenac sodium (VOLTAREN) 1 % GEL    Sig: Apply 2 to 4 grams to each knee up to four times a day    Dispense:  100 g    Refill:  3    Orders Placed This Encounter  Procedures  . CBC with Differential/Platelet  . COMPLETE METABOLIC PANEL WITH GFR  . Hemoglobin A1c  . Lipid panel

## 2018-07-01 NOTE — Assessment & Plan Note (Signed)
Check liver and kidneys 

## 2018-07-01 NOTE — Assessment & Plan Note (Signed)
Check lipids today; limit saturated fats (food from cows and pigs)

## 2018-07-02 LAB — CBC WITH DIFFERENTIAL/PLATELET
Absolute Monocytes: 456 cells/uL (ref 200–950)
Basophils Absolute: 38 cells/uL (ref 0–200)
Basophils Relative: 1 %
Eosinophils Absolute: 0 cells/uL — ABNORMAL LOW (ref 15–500)
Eosinophils Relative: 0 %
HEMATOCRIT: 37 % (ref 35.0–45.0)
Hemoglobin: 12.6 g/dL (ref 11.7–15.5)
Lymphs Abs: 1311 cells/uL (ref 850–3900)
MCH: 29 pg (ref 27.0–33.0)
MCHC: 34.1 g/dL (ref 32.0–36.0)
MCV: 85.1 fL (ref 80.0–100.0)
MPV: 10.7 fL (ref 7.5–12.5)
Monocytes Relative: 12 %
Neutro Abs: 1995 cells/uL (ref 1500–7800)
Neutrophils Relative %: 52.5 %
Platelets: 224 10*3/uL (ref 140–400)
RBC: 4.35 10*6/uL (ref 3.80–5.10)
RDW: 12.5 % (ref 11.0–15.0)
Total Lymphocyte: 34.5 %
WBC: 3.8 10*3/uL (ref 3.8–10.8)

## 2018-07-02 LAB — COMPLETE METABOLIC PANEL WITH GFR
AG Ratio: 1.3 (calc) (ref 1.0–2.5)
ALT: 8 U/L (ref 6–29)
AST: 15 U/L (ref 10–35)
Albumin: 3.8 g/dL (ref 3.6–5.1)
Alkaline phosphatase (APISO): 66 U/L (ref 37–153)
BUN: 12 mg/dL (ref 7–25)
CO2: 28 mmol/L (ref 20–32)
Calcium: 9.2 mg/dL (ref 8.6–10.4)
Chloride: 105 mmol/L (ref 98–110)
Creat: 0.61 mg/dL (ref 0.50–0.99)
GFR, Est African American: 108 mL/min/{1.73_m2} (ref >=60)
GFR, Est Non African American: 93 mL/min/{1.73_m2} (ref >=60)
GLOBULIN: 2.9 g/dL (ref 1.9–3.7)
Glucose, Bld: 76 mg/dL (ref 65–99)
Potassium: 4 mmol/L (ref 3.5–5.3)
SODIUM: 141 mmol/L (ref 135–146)
Total Bilirubin: 0.5 mg/dL (ref 0.2–1.2)
Total Protein: 6.7 g/dL (ref 6.1–8.1)

## 2018-07-02 LAB — LIPID PANEL
Cholesterol: 219 mg/dL — ABNORMAL HIGH (ref <200)
HDL: 79 mg/dL (ref >=50)
LDL Cholesterol (Calc): 124 mg/dL (calc) — ABNORMAL HIGH
Non-HDL Cholesterol (Calc): 140 mg/dL (calc) — ABNORMAL HIGH (ref <130)
Total CHOL/HDL Ratio: 2.8 (calc) (ref <5.0)
Triglycerides: 67 mg/dL (ref <150)

## 2018-07-02 LAB — HEMOGLOBIN A1C
Hgb A1c MFr Bld: 6 % of total Hgb — ABNORMAL HIGH (ref <5.7)
Mean Plasma Glucose: 126 (calc)
eAG (mmol/L): 7 (calc)

## 2018-07-03 NOTE — Progress Notes (Signed)
Joelene Millin, please let the patient know that her white blood cell count is normal. She is not anemic. Her glucose is normal. Her kidney function is excellent for a woman of 67 years! Her 3 month blood sugar average is in the prediabetes range, so we'll want to follow this every 6 months. Limit sweets and sugary drinks and simple carbohydrates. Her cholesterol is just high enough that I'm going to recommend a statin. We really think it's a good idea if someone's risk of having a heart attack is 7.5% or higher over the next 10 years, and her risk is 7.6%. If she's willing, back to me for Rx and we'll recheck her lipids in 6 weeks (please ORDER).  Of course, limit saturated fats. If she refuses (which I think she might do), urge her to really watch her diet and return in 12 weeks for a recheck cholesterol (please ORDER that). Thank you The 10-year ASCVD risk score Mikey Bussing DC Brooke Bonito., et al., 2013) is: 7.6%   Values used to calculate the score:     Age: 34 years     Sex: Female     Is Non-Hispanic African American: Yes     Diabetic: No     Tobacco smoker: No     Systolic Blood Pressure: 972 mmHg     Is BP treated: No     HDL Cholesterol: 79 mg/dL     Total Cholesterol: 219 mg/dL

## 2018-07-06 ENCOUNTER — Telehealth: Payer: Self-pay

## 2018-07-06 DIAGNOSIS — R7303 Prediabetes: Secondary | ICD-10-CM

## 2018-07-06 DIAGNOSIS — E782 Mixed hyperlipidemia: Secondary | ICD-10-CM

## 2018-07-06 NOTE — Telephone Encounter (Signed)
-----   Message from Arnetha Courser, MD sent at 07/03/2018  6:38 PM EST ----- Joelene Millin, please let the patient know that her white blood cell count is normal. She is not anemic. Her glucose is normal. Her kidney function is excellent for a woman of 67 years! Her 3 month blood sugar average is in the prediabetes range, so we'll want to follow this every 6 months. Limit sweets and sugary drinks and simple carbohydrates. Her cholesterol is just high enough that I'm going to recommend a statin. We really think it's a good idea if someone's risk of having a heart attack is 7.5% or higher over the next 10 years, and her risk is 7.6%. If she's willing, back to me for Rx and we'll recheck her lipids in 6 weeks (please ORDER).  Of course, limit saturated fats. If she refuses (which I think she might do), urge her to really watch her diet and return in 12 weeks for a recheck cholesterol (please ORDER that). Thank you The 10-year ASCVD risk score Mikey Bussing DC Brooke Bonito., et al., 2013) is: 7.6%   Values used to calculate the score:     Age: 68 years     Sex: Female     Is Non-Hispanic African American: Yes     Diabetic: No     Tobacco smoker: No     Systolic Blood Pressure: 929 mmHg     Is BP treated: No     HDL Cholesterol: 79 mg/dL     Total Cholesterol: 219 mg/dL

## 2018-07-21 DIAGNOSIS — H16222 Keratoconjunctivitis sicca, not specified as Sjogren's, left eye: Secondary | ICD-10-CM | POA: Diagnosis not present

## 2018-08-03 DIAGNOSIS — H16002 Unspecified corneal ulcer, left eye: Secondary | ICD-10-CM | POA: Diagnosis not present

## 2018-08-24 DIAGNOSIS — H16002 Unspecified corneal ulcer, left eye: Secondary | ICD-10-CM | POA: Diagnosis not present

## 2018-09-12 ENCOUNTER — Encounter: Payer: Self-pay | Admitting: Emergency Medicine

## 2018-09-12 ENCOUNTER — Other Ambulatory Visit: Payer: Self-pay

## 2018-09-12 ENCOUNTER — Ambulatory Visit
Admission: EM | Admit: 2018-09-12 | Discharge: 2018-09-12 | Disposition: A | Discharge status: Home / Self Care | Payer: Medicare HMO | Attending: Urgent Care | Admitting: Urgent Care

## 2018-09-12 DIAGNOSIS — R059 Cough, unspecified: Secondary | ICD-10-CM

## 2018-09-12 DIAGNOSIS — H65191 Other acute nonsuppurative otitis media, right ear: Secondary | ICD-10-CM

## 2018-09-12 DIAGNOSIS — J0100 Acute maxillary sinusitis, unspecified: Secondary | ICD-10-CM | POA: Diagnosis not present

## 2018-09-12 DIAGNOSIS — R05 Cough: Secondary | ICD-10-CM | POA: Diagnosis not present

## 2018-09-12 MED ORDER — BENZONATATE 100 MG PO CAPS: 100.0000 mg | ORAL_CAPSULE | Freq: Three times a day (TID) | ORAL | 0 refills | Status: DC | PRN

## 2018-09-12 MED ORDER — AMOXICILLIN 875 MG PO TABS: 875.0000 mg | ORAL_TABLET | Freq: Two times a day (BID) | ORAL | 0 refills | Status: AC

## 2018-09-12 NOTE — ED Triage Notes (Signed)
Patient c/o sinus congestion, cough, sneezing for the past couple of days.  Patient denies fevers.

## 2018-09-12 NOTE — ED Provider Notes (Signed)
[address] [place], [place] [address] [phone]   Name: Ebony [name] DOB: 08-Nov-1950 MRN: [id number] CSN: [id number] PCP: Arnetha Courser, MD  Arrival date and time:  09/12/18 1125  Chief Complaint:  Sinus Problem  NOTE: Prior to seeing the patient today, I have reviewed the triage nursing documentation and vital signs. Clinical staff has updated patient's PMH/PSHx, current medication list, and drug allergies/intolerances to ensure comprehensive history available to assist in medical decision making.   History:   HPI: Ebony [name] is a 68 y.o. female who presents today with complaints of developing sinus issues x 1 week. On Wednesday of this week (09/09/2018), patient notes that she was doing a lot of working outside (mowing and trimming hedges). That night, her symptoms began to worsen significantly. Patient with paranasal sinus pain and pressure, productive cough (green/brown sputum; intermittent blood tinged), vertiginous symptoms, and RIGHT ear pain. Patient denies any significant fevers. She has not experienced any GI symptoms. Patient is able to eat and drink normally.   Patient has been in communication with her PCP's office during this acute illness. She advises that she contacted them earlier today and was advised to present to the [facility] for further evaluation of her symptoms.   Past Medical History:  Diagnosis Date  . Anemia   . Arthritis    knees, hands  . Borderline diabetes mellitus   . Complication of anesthesia    slow to wake  . Dry eye syndrome of both eyes   . Hypercholesterolemia   . Prehypertension 12/17/2016  . Wears dentures    full upper and lower    Past Surgical History:  Procedure Laterality Date  . ABDOMINAL HYSTERECTOMY     complete  . COLONOSCOPY WITH PROPOFOL N/A 05/09/2017   Procedure: COLONOSCOPY WITH PROPOFOL;  Surgeon: Lucilla Lame, MD;  Location: [place];  Service: Endoscopy;  Laterality: N/A;  . EYE SURGERY   08/04/2017  . KNEE SURGERY Bilateral     Family History  Problem Relation Age of Onset  . Hypertension Mother   . Stroke Mother        4 mini strokes  . Thyroid disease Mother   . Kidney failure Father   . Diabetes Father   . Hypertension Father   . Kidney disease Father   . Emphysema Maternal Grandmother   . Diabetes Maternal Grandmother   . Hypertension Maternal Grandmother   . Diabetes Maternal Grandfather   . Alcohol abuse Maternal Grandfather   . Diabetes Paternal Grandmother   . Heart attack Paternal Grandmother   . Emphysema Paternal Grandfather   . Diabetes Paternal Grandfather   . Heart disease Paternal Grandfather   . Thyroid disease Sister   . Vitamin D deficiency Sister   . Arthritis Sister   . COPD Sister   . Fibromyalgia Sister   . Hypertension Sister   . Hyperlipidemia Sister   . Asthma Sister   . Thyroid disease Sister   . Breast cancer Other   . Cancer Neg Hx     Social History   Socioeconomic History  . Marital status: Married    Spouse name: Not on file  . Number of children: 3  . Years of education: Not on file  . Highest education level: High school graduate  Occupational History  . Not on file  Social Needs  . Financial resource strain: Not hard at all  . Food insecurity:    Worry: Never true    Inability: Never  true  . Transportation needs:    Medical: No    Non-medical: No  Tobacco Use  . Smoking status: Never Smoker  . Smokeless tobacco: Never Used  Substance and Sexual Activity  . Alcohol use: No  . Drug use: No  . Sexual activity: Not Currently  Lifestyle  . Physical activity:    Days per week: 7 days    Minutes per session: 60 min  . Stress: Not at all  Relationships  . Social connections:    Talks on phone: More than three times a week    Gets together: More than three times a week    Attends religious service: More than 4 times per year    Active member of club or organization: No    Attends meetings of clubs or  organizations: Never    Relationship status: Married  . Intimate partner violence:    Fear of current or ex partner: No    Emotionally abused: No    Physically abused: No    Forced sexual activity: No  Other Topics Concern  . Not on file  Social History Narrative  . Not on file    Patient Active Problem List   Diagnosis Date Noted  . Hyperlipidemia 07/01/2018  . Prediabetes 07/01/2018  . Anxiety 05/19/2017  . Special screening for malignant neoplasms, colon   . Postmenopausal status 02/04/2017  . Preventative health care 02/04/2017  . Neutropenia ([place]) 12/18/2016  . Prehypertension 12/17/2016  . Medication monitoring encounter 12/17/2016  . Adjustment disorder with mixed anxiety and depressed mood 10/11/2015  . Heartburn 10/11/2015  . Arthritis of knee, degenerative 07/18/2015    Home Medications:    Current Meds  Medication Sig  . aspirin 81 MG EC tablet Take 81 mg by mouth daily. Swallow whole.  . B Complex-C (SUPER B COMPLEX PO) Take 1 tablet by mouth daily.  . Calcium Carbonate-Vit D-Min (CALCIUM 1200 PO) Take 600 mg by mouth 2 (two) times daily.  Rolena Infante [place] 450 MG CAPS Take 450 mg by mouth daily as needed.   . Cinnamon 500 MG TABS Take 1,000 mg by mouth daily.   . Cyanocobalamin (VITAMIN B-12 SL) Place under the tongue daily.  Marland Kitchen erythromycin ophthalmic ointment Place 1 application into the left eye at bedtime.  . famotidine (PEPCID) 20 MG tablet Take 1 tablet (20 mg total) by mouth 2 (two) times daily.  . fluticasone (FLONASE) 50 MCG/ACT nasal spray Place 2 sprays into both nostrils daily.  Marland Kitchen Lifitegrast (XIIDRA) 5 % SOLN Apply 1 drop to eye 2 (two) times daily.  Marland Kitchen moxifloxacin (VIGAMOX) 0.5 % ophthalmic solution Place 1 drop into the left eye 3 (three) times daily.  . TURMERIC CURCUMIN PO Take 1 capsule by mouth 3 (three) times a week.    Allergies:   Celecoxib  Review of Systems (ROS): Review of Systems  Constitutional: Negative for chills and fever.   HENT: Positive for congestion, ear pain, postnasal drip, rhinorrhea, sinus pressure, sinus pain and sneezing. Negative for ear discharge and sore throat.   Respiratory: Positive for cough. Negative for shortness of breath.   Cardiovascular: Negative for chest pain and palpitations.  Gastrointestinal: Negative for abdominal pain, diarrhea, nausea and vomiting.  Musculoskeletal: Positive for myalgias. Negative for back pain.  Skin: Negative for color change and rash.  Allergic/Immunologic: Positive for environmental allergies (seasonal).  Neurological: Positive for dizziness. Negative for weakness, numbness and headaches.  Hematological: Negative for adenopathy.  All other systems reviewed and are  negative.    Physical Exam:  Triage Vital Signs ED Triage Vitals  Enc Vitals Group     BP 09/12/18 1142 109/83     Pulse Rate 09/12/18 1142 63     Resp 09/12/18 1142 16     Temp 09/12/18 1142 98.4 F (36.9 C)     Temp Source 09/12/18 1142 Oral     SpO2 09/12/18 1142 100 %     Weight 09/12/18 1139 146 lb (66.2 kg)     Height 09/12/18 1139 5' 7.5" (1.715 m)     Head Circumference --      Peak Flow --      Pain Score 09/12/18 1139 0     Pain Loc --      Pain Edu? --      Excl. in [place]? --     Physical Exam  Constitutional: She is oriented to person, place, and time and well-developed, well-nourished, and in no distress. She appears toxic. She has a sickly appearance. No distress.  HENT:  Head: Normocephalic and atraumatic.  Right Ear: Hearing normal. There is tenderness. No drainage. Tympanic membrane is not erythematous. A middle ear effusion is present. No decreased hearing is noted.  Left Ear: Hearing, tympanic membrane, external ear and ear canal normal. No drainage or tenderness.  No middle ear effusion. No decreased hearing is noted.  Nose: Mucosal edema, rhinorrhea and sinus tenderness present. No epistaxis. Right sinus exhibits maxillary sinus tenderness. Right sinus exhibits no  frontal sinus tenderness. Left sinus exhibits no maxillary sinus tenderness and no frontal sinus tenderness.  Mouth/Throat: Uvula is midline and mucous membranes are normal. Posterior oropharyngeal erythema present. No oropharyngeal exudate or posterior oropharyngeal edema.  Eyes: Pupils are equal, round, and reactive to light. EOM are normal.  Neck: Normal range of motion. Neck supple. No tracheal deviation present.  Cardiovascular: Normal rate, regular rhythm, normal heart sounds and intact distal pulses. Exam reveals no gallop and no friction rub.  No murmur heard. Pulmonary/Chest: Effort normal and breath sounds normal. No respiratory distress. She has no wheezes. She has no rales.  Lymphadenopathy:    She has no cervical adenopathy.    She has no axillary adenopathy.  Neurological: She is alert and oriented to person, place, and time. She has normal motor skills and intact cranial nerves. Gait normal.  Skin: Skin is warm and dry. No rash noted. No erythema.  Psychiatric: Mood, affect and judgment normal.  Nursing note and vitals reviewed.    Urgent Care Treatments / Results:   LABS: PLEASE NOTE: all labs that were ordered this encounter are listed, however only abnormal results are displayed. Labs Reviewed - No data to display  EKG: -NONE  RADIOLOGY: No results found.  PRODEDURES: Procedures  MEDICATIONS RECEIVED THIS VISIT: Medications - No data to display  PERTINENT CLINICAL COURSE NOTES/UPDATES: No data to display   Initial Impression / Assessment and Plan / Urgent Care Course:    MELVENA VINK is a 68 y.o. female who presents to [facility] today with complaints of Sinus Problem  Pertinent labs & imaging results that were available during my care of the patient were personally reviewed by me and considered in my medical decision making (see lab/imaging section of note for values and interpretations).  Patient presents today feeling poorly overall. Symptoms  have been progressive x 1 week, with an acute exacerbation on 09/09/2018. Patient has been symptomatically treating her symptoms at home. Exam reveal paranasal sinus tenderness. RIGHT ear  with an effusion secondary to forceful nose blowing; TM not erythematous. She has a productive cough. Symptoms consistent with maxillary sinusitis. Will treat with a 5 day course of amoxicillin for the sinus infection, in addition to Tessalon PRN for her cough. Patient encouraged to rest and increase fluid intake.   Discussed follow up with primary care physician this week for re-evaluation. I have reviewed the follow up and strict return precautions for any new or worsening symptoms. Patient is aware of symptoms that would be deemed urgent/emergent, and would thus require further evaluation either here or in the emergency department. At the time of discharge, she verbalized understanding and consent with the discharge plan as it was reviewed with her. All questions were fielded by provider and/or clinic staff prior to patient discharge.    Final Clinical Impressions(s) / Urgent Care Diagnoses:   Final diagnoses:  Acute non-recurrent maxillary sinusitis  Cough  Acute middle ear effusion, right    New Prescriptions:   Meds ordered this encounter  Medications  . amoxicillin (AMOXIL) 875 MG tablet    Sig: Take 1 tablet (875 mg total) by mouth 2 (two) times daily for 5 days.    Dispense:  10 tablet    Refill:  0  . benzonatate (TESSALON) 100 MG capsule    Sig: Take 1 capsule (100 mg total) by mouth 3 (three) times daily as needed for cough.    Dispense:  21 capsule    Refill:  0    Controlled Substance Prescriptions:  [place] Controlled Substance Registry consulted? Not Applicable  NOTE: This note was prepared using Dragon dictation software along with smaller phrase technology. Despite my best ability to proofread, there is the potential that transcriptional errors may still occur from this process, and are  completely unintentional.     Karen Kitchens, NP 09/12/18 1219

## 2018-09-12 NOTE — Discharge Instructions (Signed)
It was very nice meeting you today in clinic. Thank you for entrusting me with your care.   As discussed, you appear to have a sinus infection. You have an effusion (fluid behind your ear). Your cough is significant.   Please utilize the medications that we discussed. Your prescriptions have been called in to your pharmacy. I sent in an antibiotic and some pills to use for cough.  REST as much as possible.  Increase fluid intake as much as possible. Water is always best, as sugar and caffeine containing fluids can cause you to become dehydrated. Try to incorporate electrolyte enriched fluids, such as Gatorade or Pedialyte, into your daily fluid intake.   Make arrangements to follow up with your regular doctor in 1 week for re-evaluation. If your symptoms/condition worsens, please seek follow up care either here or in the ER. Please remember, our [facility] providers are "right here with you" when you need [place].   Again, it was my pleasure to take care of you today. Thank you for choosing our clinic. I hope that you start to feel better quickly.   Honor Loh, MSN, APRN, FNP-C, CEN Advanced Practice Provider [facility]

## 2018-09-15 ENCOUNTER — Other Ambulatory Visit: Payer: Self-pay | Admitting: Family Medicine

## 2018-09-15 DIAGNOSIS — J0111 Acute recurrent frontal sinusitis: Secondary | ICD-10-CM

## 2018-09-29 DIAGNOSIS — H16002 Unspecified corneal ulcer, left eye: Secondary | ICD-10-CM | POA: Diagnosis not present

## 2018-12-13 ENCOUNTER — Other Ambulatory Visit: Payer: Self-pay | Admitting: Family Medicine

## 2018-12-13 DIAGNOSIS — J0111 Acute recurrent frontal sinusitis: Secondary | ICD-10-CM

## 2018-12-30 ENCOUNTER — Other Ambulatory Visit: Payer: Self-pay

## 2018-12-30 ENCOUNTER — Encounter: Payer: Self-pay | Admitting: Nurse Practitioner

## 2018-12-30 ENCOUNTER — Ambulatory Visit (INDEPENDENT_AMBULATORY_CARE_PROVIDER_SITE_OTHER): Payer: Medicare HMO | Admitting: Nurse Practitioner

## 2018-12-30 VITALS — BP 120/72 | HR 86 | Temp 97.9°F | Resp 14 | Ht 68.0 in | Wt 141.9 lb

## 2018-12-30 DIAGNOSIS — D709 Neutropenia, unspecified: Secondary | ICD-10-CM | POA: Diagnosis not present

## 2018-12-30 DIAGNOSIS — G8929 Other chronic pain: Secondary | ICD-10-CM

## 2018-12-30 DIAGNOSIS — R7303 Prediabetes: Secondary | ICD-10-CM

## 2018-12-30 DIAGNOSIS — J0111 Acute recurrent frontal sinusitis: Secondary | ICD-10-CM | POA: Diagnosis not present

## 2018-12-30 DIAGNOSIS — R12 Heartburn: Secondary | ICD-10-CM | POA: Diagnosis not present

## 2018-12-30 DIAGNOSIS — Z5181 Encounter for therapeutic drug level monitoring: Secondary | ICD-10-CM | POA: Diagnosis not present

## 2018-12-30 DIAGNOSIS — E538 Deficiency of other specified B group vitamins: Secondary | ICD-10-CM

## 2018-12-30 DIAGNOSIS — E782 Mixed hyperlipidemia: Secondary | ICD-10-CM | POA: Diagnosis not present

## 2018-12-30 MED ORDER — FLUTICASONE PROPIONATE 50 MCG/ACT NA SUSP: NASAL | 11 refills | Status: DC

## 2018-12-30 MED ORDER — LIDOCAINE 5 % EX PTCH: 1.0000 | MEDICATED_PATCH | CUTANEOUS | 0 refills | Status: DC

## 2018-12-30 NOTE — Progress Notes (Signed)
Name: Ebony [name]   MRN: [id number]    DOB: 1950/11/05   Date:12/30/2018       Progress Note  Subjective  Chief Complaint  Chief Complaint  Patient presents with  . Follow-up    HPI  Hyperlipidemia & Prediabetes  Patient is not on any medications; but started a cholerst-off supplement- mostly plant byproducts. Diet:  Morning: pack of nabs and cup of coffee, maybe and extra spoon of peanut butter Snacks: jello Supper: baked chicken, carrots and rice Eats lots of vegetables, fish and chicken and cookies Snacks more than eating meals.  Drinks half water half sprite- instead of just sodas  Denies polyphagia, polydipsia, polyuria Lab Results  Component Value Date   CHOL 219 (H) 07/01/2018   HDL 79 07/01/2018   LDLCALC 124 (H) 07/01/2018   TRIG 67 07/01/2018   CHOLHDL 2.8 07/01/2018   Lab Results  Component Value Date   HGBA1C 6.0 (H) 07/01/2018    GERD Takes famotidine BID PRN, takes it a few times a month Triggers: anything with tomatoes, or late at night, spicy foods.   Neutropenia Appears to only have had one low WBC reading in the last 3 years.  Lab Results  Component Value Date   WBC 3.8 07/01/2018   HGB 12.6 07/01/2018   HCT 37.0 07/01/2018   MCV 85.1 07/01/2018   PLT 224 07/01/2018   B12 deficiency Was taking injections years ago but has been doing sublingual 1 drop under tongue every evening.   PHQ2/9: Depression screen [facility] 2/9 12/30/2018 07/01/2018 04/24/2018 12/12/2017 05/19/2017  Decreased Interest 0 0 0 0 0  Down, Depressed, Hopeless 0 0 0 0 0  PHQ - 2 Score 0 0 0 0 0  Altered sleeping 0 0 - - -  Tired, decreased energy 0 0 - - -  Change in appetite 0 0 - - -  Feeling bad or failure about yourself  0 0 - - -  Trouble concentrating 0 0 - - -  Moving slowly or fidgety/restless 0 0 - - -  Suicidal thoughts 0 0 - - -  PHQ-9 Score 0 0 - - -  Difficult doing work/chores Not difficult at all - - - -    PHQ reviewed. Negative  Patient Active Problem  List   Diagnosis Date Noted  . Hyperlipidemia 07/01/2018  . Prediabetes 07/01/2018  . Special screening for malignant neoplasms, colon   . Postmenopausal status 02/04/2017  . Neutropenia ([place]) 12/18/2016  . Medication monitoring encounter 12/17/2016  . Adjustment disorder with mixed anxiety and depressed mood 10/11/2015  . Heartburn 10/11/2015  . Arthritis of knee, degenerative 07/18/2015    Past Medical History:  Diagnosis Date  . Anemia   . Arthritis    knees, hands  . Borderline diabetes mellitus   . Complication of anesthesia    slow to wake  . Dry eye syndrome of both eyes   . Hypercholesterolemia   . Prehypertension 12/17/2016  . Wears dentures    full upper and lower    Past Surgical History:  Procedure Laterality Date  . ABDOMINAL HYSTERECTOMY     complete  . COLONOSCOPY WITH PROPOFOL N/A 05/09/2017   Procedure: COLONOSCOPY WITH PROPOFOL;  Surgeon: Lucilla Lame, MD;  Location: [place];  Service: Endoscopy;  Laterality: N/A;  . EYE SURGERY  08/04/2017  . KNEE SURGERY Bilateral     Social History   Tobacco Use  . Smoking status: Never Smoker  . Smokeless tobacco: Never Used  Substance Use Topics  . Alcohol use: No     Current Outpatient Medications:  .  acetaminophen (TYLENOL 8 HOUR) 650 MG CR tablet, Take 650 mg by mouth every 8 (eight) hours as needed for pain., Disp: , Rfl:  .  aspirin 81 MG EC tablet, Take 81 mg by mouth daily. Swallow whole., Disp: , Rfl:  .  B Complex-C (SUPER B COMPLEX PO), Take 1 tablet by mouth daily., Disp: , Rfl:  .  Calcium Carbonate-Vit D-Min (CALCIUM 1200 PO), Take 600 mg by mouth 2 (two) times daily., Disp: , Rfl:  .  Cascara Sagrada 450 MG CAPS, Take 450 mg by mouth daily as needed. , Disp: , Rfl:  .  Cyanocobalamin (VITAMIN B-12 SL), Place under the tongue daily., Disp: , Rfl:  .  diclofenac sodium (VOLTAREN) 1 % GEL, Apply 2 to 4 grams to each knee up to four times a day, Disp: 100 g, Rfl: 3 .  erythromycin  ophthalmic ointment, Place 1 application into the left eye at bedtime., Disp: , Rfl:  .  famotidine (PEPCID) 20 MG tablet, Take 1 tablet (20 mg total) by mouth 2 (two) times daily., Disp: 60 tablet, Rfl: 5 .  fluticasone (FLONASE) 50 MCG/ACT nasal spray, SPRAY 2 SPRAYS INTO EACH NOSTRIL EVERY DAY, Disp: 48 mL, Rfl: 0 .  Homeopathic Products ([employer] LEG CRAMPS PAIN RELIEF PO), Take 1 tablet by mouth daily as needed., Disp: , Rfl:  .  Lifitegrast (XIIDRA) 5 % SOLN, Apply 1 drop to eye 2 (two) times daily., Disp: , Rfl:  .  moxifloxacin (VIGAMOX) 0.5 % ophthalmic solution, Place 1 drop into the left eye 3 (three) times daily., Disp: , Rfl:  .  neomycin-colistin-hydrocortisone-thonzonium (CORTISPORIN TC) 3.07-06-08-0.5 MG/ML OTIC suspension, Place 1 drop into the left ear as needed., Disp: , Rfl:  .  OVER THE COUNTER MEDICATION, 1,000 mg. Black Cumin Seed Oil, Disp: , Rfl:  .  OVER THE COUNTER MEDICATION, VisionShield Eye Health supplement, Disp: , Rfl:  .  OVER THE COUNTER MEDICATION, CholestOff Plus, Disp: , Rfl:  .  OVER THE COUNTER MEDICATION, Sugar Metabolism Support  (Cinnamon 2000mg  plus Chromium), Disp: , Rfl:  .  Phenazopyrid-Cranbry-C-Probiot (AZO URINARY TRACT SUPPORT PO), Take by mouth., Disp: , Rfl:  .  triamcinolone cream (KENALOG) 0.1 %, Apply topically 2 (two) times daily as needed., Disp: 30 g, Rfl: 0 .  TURMERIC CURCUMIN PO, Take 1 capsule by mouth 3 (three) times a week., Disp: , Rfl:   Allergies  Allergen Reactions  . Celecoxib Anxiety and Itching    Review of Systems  Constitutional: Negative for chills, fever and malaise/fatigue.  Respiratory: Negative for cough and shortness of breath.   Cardiovascular: Negative for chest pain, palpitations and leg swelling.  Gastrointestinal: Negative for abdominal pain.  Musculoskeletal: Negative for joint pain and myalgias.  Skin: Negative for rash.  Neurological: Negative for dizziness and headaches.  Psychiatric/Behavioral: The patient  is not nervous/anxious and does not have insomnia.      No other specific complaints in a complete review of systems (except as listed in HPI above).  Objective  Vitals:   12/30/18 1013  BP: 120/72  Pulse: 86  Resp: 14  Temp: 97.9 F (36.6 C)  TempSrc: Oral  SpO2: 99%  Weight: 141 lb 14.4 oz (64.4 kg)  Height: 5\' 8"  (1.727 m)    Body mass index is 21.58 kg/m.  Nursing Note and Vital Signs reviewed.  Physical Exam Vitals signs reviewed.  Constitutional:  Appearance: She is well-developed.  HENT:     Head: Normocephalic and atraumatic.  Neck:     Musculoskeletal: Normal range of motion and neck supple.     Vascular: No carotid bruit.  Cardiovascular:     Heart sounds: Normal heart sounds.  Pulmonary:     Effort: Pulmonary effort is normal.     Breath sounds: Normal breath sounds.  Abdominal:     General: Bowel sounds are normal.     Palpations: Abdomen is soft.     Tenderness: There is no abdominal tenderness.  Musculoskeletal: Normal range of motion.  Skin:    General: Skin is warm and dry.     Capillary Refill: Capillary refill takes less than 2 seconds.  Neurological:     Mental Status: She is alert and oriented to person, place, and time.     GCS: GCS eye subscore is 4. GCS verbal subscore is 5. GCS motor subscore is 6.     Sensory: No sensory deficit.  Psychiatric:        Speech: Speech normal.        Behavior: Behavior normal.        Thought Content: Thought content normal.        Judgment: Judgment normal.        No results found for this or any previous visit (from the past 48 hour(s)).  Assessment & Plan  1. Mixed hyperlipidemia - Lipid Profile  2. Neutropenia, unspecified type ([place]) Will check next labs  3. Prediabetes Improved diet  - HgB A1c  4. Heartburn Improved with lifestyle changes   5. B12 deficiency Taking supplementation  - B12  6. Medication monitoring encounter Taking OTC supplements  - COMPLETE METABOLIC  PANEL WITH GFR  7. Acute recurrent frontal sinusitis No symptoms presently but wants just in case - fluticasone (FLONASE) 50 MCG/ACT nasal spray; SPRAY 2 SPRAYS INTO EACH NOSTRIL EVERY DAY  Dispense: 48 mL; Refill: 11  8. Other chronic pain Was using her moms lidoderm with pain relief in back and legs - lidocaine (LIDODERM) 5 %; Place 1 patch onto the skin daily. Remove & Discard patch within 12 hours or as directed by MD  Dispense: 30 patch; Refill: 0

## 2018-12-30 NOTE — Patient Instructions (Signed)
-   cut down on coffee, increase water intake to 60 ounces daily - add in healthy snacks like fruits and vegetables throughout the day -If you have not heard anything from my staff in a week about any orders/referrals/studies from today, please contact us here to follow-up [phone]

## 2018-12-31 LAB — LIPID PANEL
Cholesterol: 226 mg/dL — ABNORMAL HIGH (ref <200)
HDL: 89 mg/dL (ref >=50)
LDL Cholesterol (Calc): 122 mg/dL (calc) — ABNORMAL HIGH
Non-HDL Cholesterol (Calc): 137 mg/dL (calc) — ABNORMAL HIGH (ref <130)
Total CHOL/HDL Ratio: 2.5 (calc) (ref <5.0)
Triglycerides: 54 mg/dL (ref <150)

## 2018-12-31 LAB — COMPLETE METABOLIC PANEL WITH GFR
AG Ratio: 1.5 (calc) (ref 1.0–2.5)
ALT: 9 U/L (ref 6–29)
AST: 15 U/L (ref 10–35)
Albumin: 4 g/dL (ref 3.6–5.1)
Alkaline phosphatase (APISO): 65 U/L (ref 37–153)
BUN: 14 mg/dL (ref 7–25)
CO2: 30 mmol/L (ref 20–32)
Calcium: 9.1 mg/dL (ref 8.6–10.4)
Chloride: 107 mmol/L (ref 98–110)
Creat: 0.61 mg/dL (ref 0.50–0.99)
GFR, Est African American: 107 mL/min/{1.73_m2} (ref >=60)
GFR, Est Non African American: 92 mL/min/{1.73_m2} (ref >=60)
Globulin: 2.6 g/dL (calc) (ref 1.9–3.7)
Glucose, Bld: 81 mg/dL (ref 65–99)
Potassium: 4.4 mmol/L (ref 3.5–5.3)
Sodium: 143 mmol/L (ref 135–146)
Total Bilirubin: 0.7 mg/dL (ref 0.2–1.2)
Total Protein: 6.6 g/dL (ref 6.1–8.1)

## 2018-12-31 LAB — VITAMIN B12: Vitamin B-12: 1626 pg/mL — ABNORMAL HIGH (ref 200–1100)

## 2018-12-31 LAB — HEMOGLOBIN A1C
Hgb A1c MFr Bld: 6 % of total Hgb — ABNORMAL HIGH (ref <5.7)
Mean Plasma Glucose: 126 (calc)
eAG (mmol/L): 7 (calc)

## 2019-02-25 ENCOUNTER — Observation Stay
Admission: EM | Admit: 2019-02-25 | Discharge: 2019-02-26 | Disposition: A | Discharge status: Home / Self Care | Payer: Medicare HMO | Attending: Specialist | Admitting: Specialist

## 2019-02-25 ENCOUNTER — Other Ambulatory Visit: Payer: Self-pay

## 2019-02-25 ENCOUNTER — Ambulatory Visit
Admission: EM | Admit: 2019-02-25 | Discharge: 2019-02-25 | Disposition: A | Discharge status: Other Acute Inpatient Hospital | Payer: Medicare HMO | Source: Home / Self Care

## 2019-02-25 ENCOUNTER — Encounter: Payer: Self-pay | Admitting: Emergency Medicine

## 2019-02-25 ENCOUNTER — Emergency Department: Payer: Medicare HMO

## 2019-02-25 ENCOUNTER — Ambulatory Visit: Payer: Self-pay | Admitting: *Deleted

## 2019-02-25 DIAGNOSIS — R519 Headache, unspecified: Secondary | ICD-10-CM | POA: Insufficient documentation

## 2019-02-25 DIAGNOSIS — E119 Type 2 diabetes mellitus without complications: Secondary | ICD-10-CM | POA: Diagnosis not present

## 2019-02-25 DIAGNOSIS — E7800 Pure hypercholesterolemia, unspecified: Secondary | ICD-10-CM | POA: Insufficient documentation

## 2019-02-25 DIAGNOSIS — R079 Chest pain, unspecified: Secondary | ICD-10-CM | POA: Insufficient documentation

## 2019-02-25 DIAGNOSIS — R42 Dizziness and giddiness: Secondary | ICD-10-CM | POA: Insufficient documentation

## 2019-02-25 DIAGNOSIS — E785 Hyperlipidemia, unspecified: Secondary | ICD-10-CM | POA: Insufficient documentation

## 2019-02-25 DIAGNOSIS — Z7951 Long term (current) use of inhaled steroids: Secondary | ICD-10-CM | POA: Insufficient documentation

## 2019-02-25 DIAGNOSIS — Z7982 Long term (current) use of aspirin: Secondary | ICD-10-CM | POA: Insufficient documentation

## 2019-02-25 DIAGNOSIS — E538 Deficiency of other specified B group vitamins: Secondary | ICD-10-CM | POA: Diagnosis not present

## 2019-02-25 DIAGNOSIS — R9431 Abnormal electrocardiogram [ECG] [EKG]: Secondary | ICD-10-CM

## 2019-02-25 DIAGNOSIS — K219 Gastro-esophageal reflux disease without esophagitis: Secondary | ICD-10-CM | POA: Diagnosis not present

## 2019-02-25 DIAGNOSIS — Z79899 Other long term (current) drug therapy: Secondary | ICD-10-CM | POA: Diagnosis not present

## 2019-02-25 DIAGNOSIS — R110 Nausea: Secondary | ICD-10-CM

## 2019-02-25 DIAGNOSIS — Z8249 Family history of ischemic heart disease and other diseases of the circulatory system: Secondary | ICD-10-CM | POA: Diagnosis not present

## 2019-02-25 DIAGNOSIS — R0789 Other chest pain: Secondary | ICD-10-CM | POA: Diagnosis not present

## 2019-02-25 DIAGNOSIS — R61 Generalized hyperhidrosis: Secondary | ICD-10-CM

## 2019-02-25 DIAGNOSIS — Z20828 Contact with and (suspected) exposure to other viral communicable diseases: Secondary | ICD-10-CM | POA: Diagnosis not present

## 2019-02-25 LAB — BASIC METABOLIC PANEL
Anion gap: 9 (ref 5–15)
BUN: 17 mg/dL (ref 8–23)
CO2: 23 mmol/L (ref 22–32)
Calcium: 9 mg/dL (ref 8.9–10.3)
Chloride: 107 mmol/L (ref 98–111)
Creatinine, Ser: 0.4 mg/dL — ABNORMAL LOW (ref 0.44–1.00)
GFR calc Af Amer: >60 mL/min (ref >60)
GFR calc non Af Amer: >60 mL/min (ref >60)
Glucose, Bld: 139 mg/dL — ABNORMAL HIGH (ref 70–99)
Potassium: 3.9 mmol/L (ref 3.5–5.1)
Sodium: 139 mmol/L (ref 135–145)

## 2019-02-25 LAB — CREATININE, SERUM
Creatinine, Ser: 0.4 mg/dL — ABNORMAL LOW (ref 0.44–1.00)
GFR calc Af Amer: >60 mL/min (ref >60)
GFR calc non Af Amer: >60 mL/min (ref >60)

## 2019-02-25 LAB — CBC
HCT: 40.7 % (ref 36.0–46.0)
Hemoglobin: 13.1 g/dL (ref 12.0–15.0)
MCH: 28.4 pg (ref 26.0–34.0)
MCHC: 32.2 g/dL (ref 30.0–36.0)
MCV: 88.1 fL (ref 80.0–100.0)
Platelets: 186 10*3/uL (ref 150–400)
RBC: 4.62 MIL/uL (ref 3.87–5.11)
RDW: 13.5 % (ref 11.5–15.5)
WBC: 4.1 10*3/uL (ref 4.0–10.5)
nRBC: 0 % (ref 0.0–0.2)

## 2019-02-25 LAB — TROPONIN I (HIGH SENSITIVITY)
Troponin I (High Sensitivity): <2 ng/L (ref <18)
Troponin I (High Sensitivity): <2 ng/L (ref <18)

## 2019-02-25 LAB — GLUCOSE, CAPILLARY: Glucose-Capillary: 130 mg/dL — ABNORMAL HIGH (ref 70–99)

## 2019-02-25 LAB — BRAIN NATRIURETIC PEPTIDE: B Natriuretic Peptide: 54 pg/mL (ref 0.0–100.0)

## 2019-02-25 LAB — HIV ANTIBODY (ROUTINE TESTING W REFLEX): HIV Screen 4th Generation wRfx: NONREACTIVE

## 2019-02-25 MED ORDER — ALUM & MAG HYDROXIDE-SIMETH 200-200-20 MG/5ML PO SUSP: 30.0000 mL | Freq: Once | ORAL | Status: DC

## 2019-02-25 MED ORDER — ASPIRIN 81 MG PO CHEW
324.0000 mg | CHEWABLE_TABLET | Freq: Once | ORAL | Status: AC
  Administered 2019-02-25: 324 mg via ORAL
  Filled 2019-02-25: qty 4

## 2019-02-25 MED ORDER — ENOXAPARIN SODIUM 40 MG/0.4ML ~~LOC~~ SOLN
40.0000 mg | SUBCUTANEOUS | Status: DC
  Administered 2019-02-25: 40 mg via SUBCUTANEOUS
  Filled 2019-02-25: qty 0.4

## 2019-02-25 MED ORDER — ONDANSETRON HCL 4 MG/2ML IJ SOLN: 4.0000 mg | Freq: Four times a day (QID) | INTRAMUSCULAR | Status: DC | PRN

## 2019-02-25 MED ORDER — ACETAMINOPHEN 325 MG PO TABS
650.0000 mg | ORAL_TABLET | ORAL | Status: DC | PRN
  Administered 2019-02-26 (×2): 650 mg via ORAL
  Filled 2019-02-25 (×2): qty 2

## 2019-02-25 MED ORDER — ASPIRIN EC 81 MG PO TBEC
81.0000 mg | DELAYED_RELEASE_TABLET | Freq: Every day | ORAL | Status: DC
  Administered 2019-02-26: 81 mg via ORAL
  Filled 2019-02-25: qty 1

## 2019-02-25 MED ORDER — NITROGLYCERIN 0.4 MG SL SUBL
0.4000 mg | SUBLINGUAL_TABLET | SUBLINGUAL | Status: DC | PRN
  Administered 2019-02-25: 0.4 mg via SUBLINGUAL
  Filled 2019-02-25: qty 1

## 2019-02-25 NOTE — ED Provider Notes (Signed)
[place], [place]   Name: Ebony [name] DOB: 01-Feb-1951 MRN: [id number] CSN: [id number] PCP: [facility] date and time:  02/25/19 1133  Chief Complaint:  Dizziness and Nausea   NOTE: Prior to seeing the patient today, I have reviewed the triage nursing documentation and vital signs. Clinical staff has updated patient's PMH/PSHx, current medication list, and drug allergies/intolerances to ensure comprehensive history available to assist in medical decision making.   History:   HPI: Ebony [name] is a 68 y.o. female who presents today with complaints of acute onset dizziness that began with acute onset at 0600 this morning. Patient notes that she attempted to get out of bed and immediately experienced significant dizziness to the point of feeling pre-syncopal. Patient reports that she became diaphoretic and nauseated. She denies any associated chest pain, shortness of breath, or palpitations. She does report that she experienced "some indigestion", however states, "I would not call it chest pain". Patient reports that she laid back down and the symptoms improved. Of note, she reports similar symptoms a few months ago at which time she was diagnosed with a sinus infection. In review of her EMR, the patient had been experiencing sinus related symptoms for a week at that point, whereas her symptoms today began with abrupt onset. Patient tried to get OOB a second time and experienced the profound dizziness and nausea again. She then called her PCP for recommendations citing the fact that she could not even walk because she was "wobbly and felt like she was going to pass out". PCP recommended further evaluation in the urgent care or in the emergency department setting today. Patient presents today stating, "I just don't feel well at all". She is observed holding an empty emesis bag upon arrival to the treatment room.   Past Medical History:  Diagnosis Date   Anemia     Arthritis    knees, hands   Borderline diabetes mellitus    Complication of anesthesia    slow to wake   Dry eye syndrome of both eyes    Hypercholesterolemia    Prehypertension 12/17/2016   Wears dentures    full upper and lower    Past Surgical History:  Procedure Laterality Date   ABDOMINAL HYSTERECTOMY     complete   COLONOSCOPY WITH PROPOFOL N/A 05/09/2017   Procedure: COLONOSCOPY WITH PROPOFOL;  Surgeon: Lucilla Lame, MD;  Location: [facility];  Service: Endoscopy;  Laterality: N/A;   EYE SURGERY  08/04/2017   KNEE SURGERY Bilateral     Family History  Problem Relation Age of Onset   Hypertension Mother    Stroke Mother        4 mini strokes   Thyroid disease Mother    Kidney failure Father    Diabetes Father    Hypertension Father    Kidney disease Father    Emphysema Maternal Grandmother    Diabetes Maternal Grandmother    Hypertension Maternal Grandmother    Diabetes Maternal Grandfather    Alcohol abuse Maternal Grandfather    Diabetes Paternal Grandmother    Heart attack Paternal Grandmother    Emphysema Paternal Grandfather    Diabetes Paternal Grandfather    Heart disease Paternal Grandfather    Thyroid disease Sister    Vitamin D deficiency Sister    Arthritis Sister    COPD Sister    Fibromyalgia Sister    Hypertension Sister    Hyperlipidemia Sister    Asthma Sister  Thyroid disease Sister    Breast cancer Other    Cancer Neg Hx     Social History   Tobacco Use   Smoking status: Never Smoker   Smokeless tobacco: Never Used  Substance Use Topics   Alcohol use: No   Drug use: No    Patient Active Problem List   Diagnosis Date Noted   B12 deficiency 12/30/2018   Hyperlipidemia 07/01/2018   Prediabetes 07/01/2018   Adjustment disorder with mixed anxiety and depressed mood 10/11/2015   Heartburn 10/11/2015   Arthritis of knee, degenerative 07/18/2015    Home Medications:     Current Meds  Medication Sig   acetaminophen (TYLENOL 8 HOUR) 650 MG CR tablet Take 650 mg by mouth every 8 (eight) hours as needed for pain.   aspirin 81 MG EC tablet Take 81 mg by mouth daily.    B Complex-C (SUPER B COMPLEX PO) Take 1 tablet by mouth daily.   Calcium Carbonate-Vit D-Min (CALCIUM 1200 PO) Take 600 mg by mouth 2 (two) times daily.   Cascara Sagrada 450 MG CAPS Take 450 mg by mouth daily as needed.    Cyanocobalamin (VITAMIN B-12 SL) Place under the tongue daily.   erythromycin ophthalmic ointment Place 1 application into the left eye at bedtime.   famotidine (PEPCID) 20 MG tablet Take 1 tablet (20 mg total) by mouth 2 (two) times daily. (Patient not taking: Reported on 02/25/2019)   fluticasone (FLONASE) 50 MCG/ACT nasal spray SPRAY 2 SPRAYS INTO EACH NOSTRIL EVERY DAY (Patient taking differently: Place 2 sprays into both nostrils daily. )   lidocaine (LIDODERM) 5 % Place 1 patch onto the skin daily. Remove & Discard patch within 12 hours or as directed by MD (Patient not taking: Reported on 02/25/2019)   Lifitegrast (XIIDRA) 5 % SOLN Apply 1 drop to eye 2 (two) times daily.   TURMERIC CURCUMIN PO Take 1 capsule by mouth 3 (three) times a week.   [DISCONTINUED] triamcinolone cream (KENALOG) 0.1 % Apply topically 2 (two) times daily as needed.    Allergies:   Celecoxib  Review of Systems (ROS): Review of Systems  Constitutional: Positive for diaphoresis (intermittent episodes). Negative for chills and fever.  HENT: Negative for congestion, ear pain, rhinorrhea, sinus pressure, sinus pain, sore throat and trouble swallowing.   Respiratory: Negative for cough and shortness of breath.   Cardiovascular: Negative for chest pain and palpitations.  Gastrointestinal: Negative for abdominal pain, diarrhea, nausea and vomiting.  Musculoskeletal: Negative for back pain, neck pain and neck stiffness.  Skin: Negative for color change, pallor and rash.  Neurological:  Positive for dizziness and light-headedness. Negative for tremors, speech difficulty, weakness and numbness.       (+) feeling of pre-syncope  All other systems reviewed and are negative.    Vital Signs: Today's Vitals   02/25/19 1145 02/25/19 1219  BP: 134/79   Pulse: (!) 58   Resp: 18   Temp: 97.8 F (36.6 C)   TempSrc: Oral   SpO2: 100%   Weight: 142 lb (64.4 kg)   Height: 5\' 7"  (1.702 m)   PainSc: 0-No pain 0-No pain    Physical Exam: Physical Exam  Constitutional: She is oriented to person, place, and time and well-developed, well-nourished, and in no distress. No distress.  HENT:  Head: Normocephalic and atraumatic.  Nose: Nose normal.  Mouth/Throat: Oropharynx is clear and moist.  Eyes: Pupils are equal, round, and reactive to light. Conjunctivae and EOM are normal.  Neck:  Normal range of motion. Neck supple.  Cardiovascular: Regular rhythm, normal heart sounds and intact distal pulses. Bradycardia present. Exam reveals no gallop and no friction rub.  No murmur heard. Pulmonary/Chest: Effort normal. No respiratory distress. She has no decreased breath sounds. She has no wheezes. She has no rhonchi. She has no rales.  Abdominal: Soft. Normal appearance and bowel sounds are normal. She exhibits no distension. There is no abdominal tenderness.  Musculoskeletal: Normal range of motion.  Neurological: She is alert and oriented to person, place, and time. Gait normal.  Skin: Skin is warm and dry. No rash noted. She is not diaphoretic.  Psychiatric: Mood, memory, affect and judgment normal.  Nursing note and vitals reviewed.   Urgent Care Treatments / Results:   LABS: PLEASE NOTE: all labs that were ordered this encounter are listed, however only abnormal results are displayed. Labs Reviewed  GLUCOSE, CAPILLARY - Abnormal; Notable for the following components:      Result Value   Glucose-Capillary 130 (*)    All other components within normal limits  CBG MONITORING,  ED    URGENT CARE ECG REPORT Date: 02/25/2019 Time ECG obtained:  Rate: 55 bpm Rhythm: sinus bradycardia Intervals: normal ST segment and T wave changes: New T-wave inversion in leads aVF and V4-V6. Comparison: Changes noted from previous tracing done on 10/10/2015  RADIOLOGY: Dg Chest 2 View  Result Date: 02/25/2019 CLINICAL DATA:  Chest pain EXAM: CHEST - 2 VIEW COMPARISON:  October 09, 2015 FINDINGS: Lungs are clear. Heart size and pulmonary vascularity are normal. No adenopathy. There is degenerative change in the thoracic spine. No pneumothorax. IMPRESSION: No edema or consolidation.  Cardiac silhouette within normal limits. Electronically Signed   By: Lowella Grip III M.D.   On: 02/25/2019 15:19    PROCEDURES: Procedures  MEDICATIONS RECEIVED THIS VISIT: Medications - No data to display  PERTINENT CLINICAL COURSE NOTES/UPDATES: Clinical Course as of Feb 24 1654  Thu Feb 25, 2019  1215 Report called to [facility] charge nurse. Patient to present for further care in the emergency department via [place] driven by her husband.   [BG]    Clinical Course User Index [BG] Karen Kitchens, NP   Initial Impression / Assessment and Plan / Urgent Care Course:  Pertinent labs & imaging results that were available during my care of the patient were personally reviewed by me and considered in my medical decision making (see lab/imaging section of note for values and interpretations).  Ebony [name] is a 68 y.o. female who presents to [facility] today with complaints of Dizziness and Nausea   Patient is well appearing overall in clinic today. She does not appear to be in any acute distress. Presenting symptoms (see HPI) and exam as documented above. HPI concerning for ACS given reports of nausea, diaphoresis, and dizziness. She denies chest pain, shortness of breath, and palpitations here in the clinic today. EKG shows new T-wave inversion in the lateral leads. Discussed concerns with  patient based on HPI and workup thus far. She was advised that further workup in the emergency department is warranted in order to obtain a more comprehensive assessment of her symptoms. Patient in agreement as long as her husband be allowed to drive her; refused EMS transport. In the absence of CP and SOB, this is reasonable. Call placed to ED charge nurse (see notes above for timeline). Charge nurse advised of HPI, assessment, workup, and plans for her to present to there ED for ongoing  management. Nurse aware that patient will be presenting via POV driving by her husband. [place] staff encouraged to return call to [place] with any questions or concerns related to the care that Ebony [name] received here today.   Final Clinical Impressions / Urgent Care Diagnoses:   Final diagnoses:  Dizziness  Diaphoresis  Nausea without vomiting  T wave inversion in EKG    New Prescriptions:  [place] Controlled Substance Registry consulted? Not Applicable  No orders of the defined types were placed in this encounter.   Recommended Follow up Care:  Patient encouraged to follow up with the following provider within the specified time frame, or sooner as dictated by the severity of her symptoms. As always, she was instructed that for any urgent/emergent care needs, she should seek care either here or in the emergency department for more immediate evaluation.  Follow-up Information    Go to  [facility].   Specialty: Emergency Medicine Contact information: [place] [id number] [place] [phone]        NOTE: This note was prepared using Dragon dictation software along with smaller phrase technology. Despite my best ability to proofread, there is the potential that transcriptional errors may still occur from this process, and are completely unintentional.    Karen Kitchens, NP 02/25/19 1657

## 2019-02-25 NOTE — ED Notes (Signed)
ED Provider at bedside. 

## 2019-02-25 NOTE — Discharge Instructions (Addendum)
Leave urgent care and proceed directly to [facility] emergency department for further workup. I have called and let them know that you are coming.   Honor Loh, MSN, APRN, FNP-C, CEN Advanced Practice Provider [facility] 02/25/2019 12:09 PM

## 2019-02-25 NOTE — ED Triage Notes (Signed)
Patient presents to the ED with dizziness on waking up this am that felt similar to when she has had sinus infections before.  Patient went to UC and was instructed to come to the ED due to EKG changes.  Patient reports breaking out in sweats multiple times today.  Patient feeling palpitations and indigestions and nausea this am.  Patient states, "I thought it was something I ate last night, I didn't think it was chest pain."

## 2019-02-25 NOTE — ED Provider Notes (Signed)
[facility] Emergency Department Provider Note   ____________________________________________   First MD Initiated Contact with Patient 02/25/19 1421     (approximate)  I have reviewed the triage vital signs and the nursing notes.   HISTORY  Chief Complaint Dizziness    HPI Ebony [name] is a 68 y.o. female with history of hyperlipidemia who presents to the ED complaining of dizziness and chest pain.  Patient reports that since she woke up this morning she has been having intermittent episodes of dizziness as well as discomfort in the center of her chest.  These episodes seem to occur primarily when she is up walking around, when she will feel like the room is spinning around her, break out in a significant sweat, and have some discomfort in her chest.  She describes the chest discomfort as "like indigestion" that seems to improve when she stops to rest.  She was initially evaluated in urgent care and referred to the ED after her EKG was found to be abnormal.  She complains of some very mild ongoing discomfort in the center of her chest, does not feel dizzy or lightheaded currently.  She states she is otherwise been feeling well with no fevers, cough, or shortness of breath.        Past Medical History:  Diagnosis Date  . Anemia   . Arthritis    knees, hands  . Borderline diabetes mellitus   . Complication of anesthesia    slow to wake  . Dry eye syndrome of both eyes   . Hypercholesterolemia   . Prehypertension 12/17/2016  . Wears dentures    full upper and lower    Patient Active Problem List   Diagnosis Date Noted  . B12 deficiency 12/30/2018  . Hyperlipidemia 07/01/2018  . Prediabetes 07/01/2018  . Adjustment disorder with mixed anxiety and depressed mood 10/11/2015  . Heartburn 10/11/2015  . Arthritis of knee, degenerative 07/18/2015    Past Surgical History:  Procedure Laterality Date  . ABDOMINAL HYSTERECTOMY     complete  .  COLONOSCOPY WITH PROPOFOL N/A 05/09/2017   Procedure: COLONOSCOPY WITH PROPOFOL;  Surgeon: Lucilla Lame, MD;  Location: [facility];  Service: Endoscopy;  Laterality: N/A;  . EYE SURGERY  08/04/2017  . KNEE SURGERY Bilateral     Prior to Admission medications   Medication Sig Start Date End Date Taking? Authorizing Provider  acetaminophen (TYLENOL 8 HOUR) 650 MG CR tablet Take 650 mg by mouth every 8 (eight) hours as needed for pain.    [provider]  aspirin 81 MG EC tablet Take 81 mg by mouth daily. Swallow whole.    [provider]  B Complex-C (SUPER B COMPLEX PO) Take 1 tablet by mouth daily.    [provider]  Calcium Carbonate-Vit D-Min (CALCIUM 1200 PO) Take 600 mg by mouth 2 (two) times daily.    [provider]  Cascara Sagrada 450 MG CAPS Take 450 mg by mouth daily as needed.     [provider]  Cyanocobalamin (VITAMIN B-12 SL) Place under the tongue daily.    [provider]  erythromycin ophthalmic ointment Place 1 application into the left eye at bedtime.    [provider]  famotidine (PEPCID) 20 MG tablet Take 1 tablet (20 mg total) by mouth 2 (two) times daily. 05/19/17   Arnetha Courser, MD  fluticasone (FLONASE) 50 MCG/ACT nasal spray SPRAY 2 SPRAYS INTO EACH NOSTRIL EVERY DAY 12/30/18  Poulose, Bethel Born, NP  Homeopathic Products ([employer] LEG CRAMPS PAIN RELIEF PO) Take 1 tablet by mouth daily as needed.    [provider]  lidocaine (LIDODERM) 5 % Place 1 patch onto the skin daily. Remove & Discard patch within 12 hours or as directed by MD 12/30/18   Poulose, Bethel Born, NP  Lifitegrast Shirley Friar) 5 % SOLN Apply 1 drop to eye 2 (two) times daily.    [provider]  moxifloxacin (VIGAMOX) 0.5 % ophthalmic solution Place 1 drop into the left eye 3 (three) times daily.    [provider]  OVER THE COUNTER MEDICATION 1,000 mg. Black Cumin [employer], Historical, MD  [place] supplement    [provider]  OVER THE COUNTER MEDICATION CholestOff Plus    [provider]  OVER THE COUNTER MEDICATION Sugar Metabolism Support  (Cinnamon 2000mg  plus Chromium)    [provider]  Phenazopyrid-Cranbry-C-Probiot (AZO URINARY TRACT SUPPORT PO) Take by mouth.    [provider]  TURMERIC CURCUMIN PO Take 1 capsule by mouth 3 (three) times a week.    [provider]    Allergies Celecoxib  Family History  Problem Relation Age of Onset  . Hypertension Mother   . Stroke Mother        4 mini strokes  . Thyroid disease Mother   . Kidney failure Father   . Diabetes Father   . Hypertension Father   . Kidney disease Father   . Emphysema Maternal Grandmother   . Diabetes Maternal Grandmother   . Hypertension Maternal Grandmother   . Diabetes Maternal Grandfather   . Alcohol abuse Maternal Grandfather   . Diabetes Paternal Grandmother   . Heart attack Paternal Grandmother   . Emphysema Paternal Grandfather   . Diabetes Paternal Grandfather   . Heart disease Paternal Grandfather   . Thyroid disease Sister   . Vitamin D deficiency Sister   . Arthritis Sister   . COPD Sister   . Fibromyalgia Sister   . Hypertension Sister   . Hyperlipidemia Sister   . Asthma Sister   . Thyroid disease Sister   . Breast cancer Other   . Cancer Neg Hx     Social History Social History   Tobacco Use  . Smoking status: Never Smoker  . Smokeless tobacco: Never Used  Substance Use Topics  . Alcohol use: No  . Drug use: No    Review of Systems  Constitutional: No fever/chills Eyes: No visual changes. ENT: No sore throat. Cardiovascular: Positive for chest pain. Respiratory: Denies shortness of breath. Gastrointestinal: No abdominal pain.  No nausea, no vomiting.  No diarrhea.  No constipation. Genitourinary: Negative for dysuria. Musculoskeletal: Negative for back pain. Skin:  Negative for rash. Neurological: Negative for headaches, focal weakness or numbness.  Positive for dizziness.  ____________________________________________   PHYSICAL EXAM:  VITAL SIGNS: ED Triage Vitals [02/25/19 1324]  Enc Vitals Group     BP 138/67     Pulse Rate 66     Resp 20     Temp 97.7 F (36.5 C)     Temp Source Oral     SpO2 98 %     Weight 141 lb (64 kg)     Height 5' 7.5" (1.715 m)     Head Circumference      Peak Flow      Pain Score 0     Pain Loc  Pain Edu?      Excl. in [place]?     Constitutional: Alert and oriented. Eyes: Conjunctivae are normal. Head: Atraumatic. Nose: No congestion/rhinnorhea. Mouth/Throat: Mucous membranes are moist. Neck: Normal ROM Cardiovascular: Normal rate, regular rhythm. Grossly normal heart sounds. Respiratory: Normal respiratory effort.  No retractions. Lungs CTAB. Gastrointestinal: Soft and nontender. No distention. Genitourinary: deferred Musculoskeletal: No lower extremity tenderness nor edema. Neurologic:  Normal speech and language. No gross focal neurologic deficits are appreciated. Skin:  Skin is warm, dry and intact. No rash noted. Psychiatric: Mood and affect are normal. Speech and behavior are normal.  ____________________________________________   LABS (all labs ordered are listed, but only abnormal results are displayed)  Labs Reviewed  BASIC METABOLIC PANEL - Abnormal; Notable for the following components:      Result Value   Glucose, Bld 139 (*)    Creatinine, Ser 0.40 (*)    All other components within normal limits  SARS CORONAVIRUS 2 (TAT 6-24 HRS)  CBC  BRAIN NATRIURETIC PEPTIDE  URINALYSIS, COMPLETE (UACMP) WITH MICROSCOPIC  TROPONIN I (HIGH SENSITIVITY)  TROPONIN I (HIGH SENSITIVITY)   ____________________________________________  EKG  ED ECG REPORT I, Blake Divine, the attending physician, personally viewed and interpreted this ECG.   Date: 02/25/2019  EKG Time: 13:24  Rate: 64   Rhythm: normal sinus rhythm  Axis: Normal  Intervals:none  ST&T Change: T wave inversions anteriorly and inferiorly, Q waves laterally, new from prior   PROCEDURES  Procedure(s) performed (including Critical Care):  Procedures   ____________________________________________   INITIAL IMPRESSION / ASSESSMENT AND PLAN / ED COURSE       68 year old female presenting to the ED for intermittent episodes since this morning of chest pain and dizziness with diaphoresis that seem to be associated with exertion.  EKG obtained at urgent care as well as EKG here are both show concerning changes including new Q waves laterally as well as T wave inversions inferiorly and laterally.  There does appear to be some progression of these changes from EKG at urgent care 2 EKG obtained here in the ED.  Patient has some mild ongoing symptoms at this time, will load with aspirin and trial nitroglycerin.  Troponin pending at this time, anticipate admission for further cardiac work-up.  Troponin within normal limits, patient's chest pain resolved following dose of nitroglycerin.  She was given aspirin load.  Given concerning episodes of chest pain with EKG changes, case was discussed with hospitalist, who accepts patient for admission.      ____________________________________________   FINAL CLINICAL IMPRESSION(S) / ED DIAGNOSES  Final diagnoses:  Chest pain, unspecified type  Diaphoresis  Abnormal ECG     ED Discharge Orders    None       Note:  This document was prepared using Dragon voice recognition software and may include unintentional dictation errors.   Blake Divine, MD 02/25/19 [phone]

## 2019-02-25 NOTE — Telephone Encounter (Signed)
Pt called in c/o waking up very dizzy.   When she sits up she feels like she is going to pass out.   She is sitting down now.   She had this happen before and had the same symptoms.    Went to the urgent care and was diagnosed with a sinus infection.    She mentioned she mowed the yard yesterday.   She is having pressure and pain in her ears and head.   Does not have a runny nose.  I checked with [facility] and they do not have any openings today so it was suggested she go to the urgent care. The protocol was ED or PCP triage.  Pt was agreeable to going to the urgent care.   I went over the care advice with her and she verbalized understanding to go to the ED if she passes out, breaks out into a sweat again or gets worse or develops a sudden headache.    Her husband is going to drive her there and help her ambulate.  Reason for Disposition . SEVERE dizziness (e.g., unable to stand, requires support to walk, feels like passing out now)  Answer Assessment - Initial Assessment Questions 1. DESCRIPTION: "Describe your dizziness."     I woke up 6:00 I had to lay back down because I was so dizzy.   I felt like I might pass out when I got up.   I had call my husband to come help me.   I'm laying down now. 4 months ago I went to the [facility] urgent care with same symptoms.   They told me I had a sinus infection.   They gave me antibiotics and something for dizziness.    I use Flonase.   2. LIGHTHEADED: "Do you feel lightheaded?" (e.g., somewhat faint, woozy, weak upon standing)     When I get up.  I can't even walk.   3. VERTIGO: "Do you feel like either you or the room is spinning or tilting?" (i.e. vertigo)     No 4. SEVERITY: "How bad is it?"  "Do you feel like you are going to faint?" "Can you stand and walk?"   - MILD - walking normally   - MODERATE - interferes with normal activities (e.g., work, school)    - SEVERE - unable to stand, requires support to walk, feels like passing out now.       Severe    My husband is helping me get around. 5. ONSET:  "When did the dizziness begin?"     I woke up this morning. 6. AGGRAVATING FACTORS: "Does anything make it worse?" (e.g., standing, change in head position)     Getting up I'm so dizzy.   I'm laying down. 7. HEART RATE: "Can you tell me your heart rate?" "How many beats in 15 seconds?"  (Note: not all patients can do this)       Not asked 8. CAUSE: "What do you think is causing the dizziness?"     My sinuses    This has happened before with a sinus infection. 9. RECURRENT SYMPTOM: "Have you had dizziness before?" If so, ask: "When was the last time?" "What happened that time?"     Yes   See above 10. OTHER SYMPTOMS: "Do you have any other symptoms?" (e.g., fever, chest pain, vomiting, diarrhea, bleeding)       My ears are hurting and I have pressure around my head coming into my ears.  I broke out in a sweat this morning.    11. PREGNANCY: "Is there any chance you are pregnant?" "When was your last menstrual period?"       N/A due to age  Protocols used: DIZZINESS [facility]

## 2019-02-25 NOTE — ED Triage Notes (Signed)
Patient c/o dizziness, sweating and ears ringing that started this morning. She states she has had this before and she was diagnosed with a sinus infection.

## 2019-02-25 NOTE — ED Notes (Signed)
Lab called to verify that they could add on BNP and troponin.

## 2019-02-25 NOTE — ED Notes (Signed)
Patient appears very tired in triage, occasionally dozing off and having to be woken up by this RN.

## 2019-02-25 NOTE — Progress Notes (Signed)
Family Meeting Note  Advance Directive:yes  Today a meeting took place with the Patient.   The following clinical team members were present during this meeting:MD  The following were discussed:Patient's diagnosis: Chest pain, abnormal EKG with new T wave inversions and Q waves in lateral leads, hyperlipidemia, osteoarthritis, GERD, B12 deficiency will be admitted to the hospital.  Initial troponin is negative.  Plan of care discussed in detail with the patient.  Patient verbalized understanding of the plan.  , Patient's progosis: Unable to determine and Goals for treatment: Full Code  Husband is healthcare power of attorney  Additional follow-up to be provided: Hospitalist, cardiology  Time spent during discussion:17 min  Nicholes Mango, MD

## 2019-02-25 NOTE — H&P (Signed)
[place] [facility] [place] NAME: Ebony [name]    MR#:  [id number]  DATE OF BIRTH:  02-20-1951  DATE OF ADMISSION:  02/25/2019  PRIMARY CARE PHYSICIAN: [facility], [place]   REQUESTING/REFERRING PHYSICIAN: [place]  CHIEF COMPLAINT:   Chest pain HISTORY OF PRESENT ILLNESS:  Ebony [name]  is a 68 y.o. female with a known history of osteoarthritis, hyperlipidemia, GERD is presenting to the ED with a chief complaint of chest pain left-sided chest pain but denies any shortness of breath.  EKG has revealed new T wave inversions in the lateral leads initial troponin is negative chest x-ray is negative and hospitalist team is called admit the patient.  Patient denies any history of smoking no similar complaints in the past and never seen by any cardiology in the past  PAST MEDICAL HISTORY:   Past Medical History:  Diagnosis Date  . Anemia   . Arthritis    knees, hands  . Borderline diabetes mellitus   . Complication of anesthesia    slow to wake  . Dry eye syndrome of both eyes   . Hypercholesterolemia   . Prehypertension 12/17/2016  . Wears dentures    full upper and lower    PAST SURGICAL HISTOIRY:   Past Surgical History:  Procedure Laterality Date  . ABDOMINAL HYSTERECTOMY     complete  . COLONOSCOPY WITH PROPOFOL N/A 05/09/2017   Procedure: COLONOSCOPY WITH PROPOFOL;  Surgeon: Lucilla Lame, MD;  Location: [facility];  Service: Endoscopy;  Laterality: N/A;  . EYE SURGERY  08/04/2017  . KNEE SURGERY Bilateral     SOCIAL HISTORY:   Social History   Tobacco Use  . Smoking status: Never Smoker  . Smokeless tobacco: Never Used  Substance Use Topics  . Alcohol use: No    FAMILY HISTORY:   Family History  Problem Relation Age of Onset  . Hypertension Mother   . Stroke Mother        4 mini strokes  . Thyroid disease Mother   . Kidney failure Father   . Diabetes Father   . Hypertension Father   .  Kidney disease Father   . Emphysema Maternal Grandmother   . Diabetes Maternal Grandmother   . Hypertension Maternal Grandmother   . Diabetes Maternal Grandfather   . Alcohol abuse Maternal Grandfather   . Diabetes Paternal Grandmother   . Heart attack Paternal Grandmother   . Emphysema Paternal Grandfather   . Diabetes Paternal Grandfather   . Heart disease Paternal Grandfather   . Thyroid disease Sister   . Vitamin D deficiency Sister   . Arthritis Sister   . COPD Sister   . Fibromyalgia Sister   . Hypertension Sister   . Hyperlipidemia Sister   . Asthma Sister   . Thyroid disease Sister   . Breast cancer Other   . Cancer Neg Hx     DRUG ALLERGIES:   Allergies  Allergen Reactions  . Celecoxib Anxiety and Itching    REVIEW OF SYSTEMS:  CONSTITUTIONAL: No fever, fatigue or weakness.  EYES: No blurred or double vision.  EARS, NOSE, AND THROAT: No tinnitus or ear pain.  RESPIRATORY: No cough, shortness of breath, wheezing or hemoptysis.  CARDIOVASCULAR: No chest pain, orthopnea, edema.  GASTROINTESTINAL: No nausea, vomiting, diarrhea or abdominal pain.  GENITOURINARY: No dysuria, hematuria.  ENDOCRINE: No polyuria, nocturia,  HEMATOLOGY: No anemia, easy bruising or bleeding SKIN: No rash or lesion. MUSCULOSKELETAL: No joint pain  or arthritis.   NEUROLOGIC: No tingling, numbness, weakness.  PSYCHIATRY: No anxiety or depression.   MEDICATIONS AT HOME:   Prior to Admission medications   Medication Sig Start Date End Date Taking? Authorizing Provider  acetaminophen (TYLENOL 8 HOUR) 650 MG CR tablet Take 650 mg by mouth every 8 (eight) hours as needed for pain.   Yes [provider]  aspirin 81 MG EC tablet Take 81 mg by mouth daily.    Yes [provider]  B Complex-C (SUPER B COMPLEX PO) Take 1 tablet by mouth daily.   Yes [provider]  Calcium Carbonate-Vit D-Min (CALCIUM 1200 PO) Take 600 mg by mouth 2 (two) times daily.   Yes [provider]  Cascara Sagrada 450 MG CAPS Take 450 mg by mouth daily as needed.    Yes [provider]  Cyanocobalamin (VITAMIN B-12) 1000 MCG SUBL Place 1,000 mcg under the tongue once a week.    Yes [provider]  fluticasone (FLONASE) 50 MCG/ACT nasal spray SPRAY 2 SPRAYS INTO EACH NOSTRIL EVERY DAY Patient taking differently: Place 2 sprays into both nostrils daily as needed for allergies or rhinitis.  12/30/18  Yes Poulose, Bethel Born, NP  Homeopathic Products ([employer] LEG CRAMPS PAIN RELIEF PO) Take 1 tablet by mouth daily as needed (leg cramps).    Yes [provider]  OVER THE COUNTER MEDICATION Take 1,000 mg by mouth daily. **Black Cumin Seed Oil**   Yes [provider]  OVER THE COUNTER MEDICATION Take 1 capsule by mouth as directed. [place]    Yes [provider]  OVER THE COUNTER MEDICATION Take 1 Product by mouth as directed. CholestOff Plus    Yes [provider]  OVER THE COUNTER MEDICATION Take 1 Dose by mouth as directed. Sugar Metabolism Support  (Cinnamon 2000mg  plus Chromium)    Yes [provider]  TURMERIC CURCUMIN PO Take 1 capsule by mouth 3 (three) times a week.   Yes [provider]  famotidine (PEPCID) 20 MG tablet Take 1 tablet (20 mg total) by mouth 2 (two) times daily. Patient not taking: Reported on 02/25/2019 05/19/17   Arnetha Courser, MD  lidocaine (LIDODERM) 5 % Place 1 patch onto the skin daily. Remove & Discard patch within 12 hours or as directed by MD Patient not taking: Reported on 02/25/2019 12/30/18   Fredderick Severance, NP      VITAL SIGNS:  Blood pressure (!) 121/93, pulse 67, temperature 97.7 F (36.5 C), temperature source Oral, resp. rate 18, height 5' 7.5" (1.715 m), weight 64 kg, SpO2 98 %.  PHYSICAL EXAMINATION:  GENERAL:  68 y.o.-year-old patient lying in the bed with no acute distress.  EYES: Pupils equal, round, reactive to light and  accommodation. No scleral icterus. Extraocular muscles intact.  HEENT: Head atraumatic, normocephalic. Oropharynx and nasopharynx clear.  NECK:  Supple, no jugular venous distention. No thyroid enlargement, no tenderness.  LUNGS: Normal breath sounds bilaterally, no wheezing, rales,rhonchi or crepitation. No use of accessory muscles of respiration.  CARDIOVASCULAR: S1, S2 normal. No murmurs, rubs, or gallops.  Anterior chest wall is tender on palpation  ABDOMEN: Soft, nontender, nondistended. Bowel sounds present. EXTREMITIES: No pedal edema, cyanosis, or clubbing.  NEUROLOGIC: Cranial nerves II through XII are intact. Muscle strength 5/5 in all extremities. Sensation intact. Gait not checked.  PSYCHIATRIC: The patient is alert and oriented x 3.  SKIN: No obvious rash, lesion, or ulcer.   LABORATORY PANEL:  CBC Recent Labs  Lab 02/25/19 1336  WBC 4.1  HGB 13.1  HCT 40.7  PLT 186   ------------------------------------------------------------------------------------------------------------------  Chemistries  Recent Labs  Lab 02/25/19 1336  NA 139  K 3.9  CL 107  CO2 23  GLUCOSE 139*  BUN 17  CREATININE 0.40*  CALCIUM 9.0   ------------------------------------------------------------------------------------------------------------------  Cardiac Enzymes No results for input(s): TROPONINI in the last 168 hours. ------------------------------------------------------------------------------------------------------------------  RADIOLOGY:  Dg Chest 2 View  Result Date: 02/25/2019 CLINICAL DATA:  Chest pain EXAM: CHEST - 2 VIEW COMPARISON:  October 09, 2015 FINDINGS: Lungs are clear. Heart size and pulmonary vascularity are normal. No adenopathy. There is degenerative change in the thoracic spine. No pneumothorax. IMPRESSION: No edema or consolidation.  Cardiac silhouette within normal limits. Electronically Signed   By: Lowella Grip III M.D.   On: 02/25/2019 15:19     EKG:   Orders placed or performed during the hospital encounter of 02/25/19  . ED EKG  . ED EKG  . EKG 12-Lead  . EKG 12-Lead  . EKG 12-Lead (recurrent chest pain)    IMPRESSION AND PLAN:    #Chest pain with the abnormal EKG with atypical presentation Admitted telemetry under observation status Cycle troponins.  Initial troponin is negative Aspirin, oxygen via nasal cannula and nitroglycerin as needed Cardiology consult placed, Dr. Montine Circle notified  echocardiogram ordered Check fasting lipid panel in a.m.  #GERD PPI  #Hyperlipidemia-check fasting lipid panel.  #B12 deficiency continue B12 supplements   DVT prophylaxis with Lovenox subcu     All the records are reviewed and case discussed with ED provider. Management plans discussed with the patient, husband at bedside and they are in agreement.  CODE STATUS: fc  TOTAL TIME TAKING CARE OF THIS PATIENT: 45 minutes.   Note: This dictation was prepared with Dragon dictation along with smaller phrase technology. Any transcriptional errors that result from this process are unintentional.  Nicholes Mango M.D on 02/25/2019 at 7:07 PM  Between 7am to 6pm - Pager - [phone]  After 6pm go to [email or web address] - password [email or web address] [facility]  Office  [phone]  CC: Primary care physician; [place]

## 2019-02-26 ENCOUNTER — Observation Stay: Payer: Medicare HMO

## 2019-02-26 ENCOUNTER — Observation Stay
Admit: 2019-02-26 | Discharge: 2019-02-26 | Disposition: A | Discharge status: Home / Self Care | Payer: Medicare HMO | Attending: Internal Medicine | Admitting: Internal Medicine

## 2019-02-26 DIAGNOSIS — E538 Deficiency of other specified B group vitamins: Secondary | ICD-10-CM | POA: Diagnosis not present

## 2019-02-26 DIAGNOSIS — R55 Syncope and collapse: Secondary | ICD-10-CM | POA: Diagnosis not present

## 2019-02-26 DIAGNOSIS — I200 Unstable angina: Secondary | ICD-10-CM | POA: Diagnosis not present

## 2019-02-26 DIAGNOSIS — K219 Gastro-esophageal reflux disease without esophagitis: Secondary | ICD-10-CM | POA: Diagnosis not present

## 2019-02-26 DIAGNOSIS — R079 Chest pain, unspecified: Secondary | ICD-10-CM | POA: Diagnosis not present

## 2019-02-26 DIAGNOSIS — E785 Hyperlipidemia, unspecified: Secondary | ICD-10-CM | POA: Diagnosis not present

## 2019-02-26 DIAGNOSIS — R519 Headache, unspecified: Secondary | ICD-10-CM | POA: Diagnosis not present

## 2019-02-26 LAB — LIPID PANEL
Cholesterol: 226 mg/dL — ABNORMAL HIGH (ref 0–200)
HDL: 87 mg/dL (ref >40)
LDL Cholesterol: 126 mg/dL — ABNORMAL HIGH (ref 0–99)
Total CHOL/HDL Ratio: 2.6 RATIO
Triglycerides: 63 mg/dL (ref <150)
VLDL: 13 mg/dL (ref 0–40)

## 2019-02-26 LAB — ECHOCARDIOGRAM COMPLETE
Height: 67 in
Weight: 2246.4 oz

## 2019-02-26 LAB — CBC
HCT: 37.6 % (ref 36.0–46.0)
Hemoglobin: 12 g/dL (ref 12.0–15.0)
MCH: 28.1 pg (ref 26.0–34.0)
MCHC: 31.9 g/dL (ref 30.0–36.0)
MCV: 88.1 fL (ref 80.0–100.0)
Platelets: 190 10*3/uL (ref 150–400)
RBC: 4.27 MIL/uL (ref 3.87–5.11)
RDW: 13.6 % (ref 11.5–15.5)
WBC: 4.4 10*3/uL (ref 4.0–10.5)
nRBC: 0 % (ref 0.0–0.2)

## 2019-02-26 LAB — SARS CORONAVIRUS 2 (TAT 6-24 HRS): SARS Coronavirus 2: NEGATIVE

## 2019-02-26 MED ORDER — FLUTICASONE PROPIONATE 50 MCG/ACT NA SUSP
2.0000 | Freq: Every day | NASAL | Status: DC | PRN
  Filled 2019-02-26: qty 16

## 2019-02-26 MED ORDER — FAMOTIDINE 20 MG PO TABS
20.0000 mg | ORAL_TABLET | Freq: Two times a day (BID) | ORAL | Status: DC
  Administered 2019-02-26 (×2): 20 mg via ORAL
  Filled 2019-02-26 (×2): qty 1

## 2019-02-26 MED ORDER — KETOROLAC TROMETHAMINE 30 MG/ML IJ SOLN
30.0000 mg | Freq: Four times a day (QID) | INTRAMUSCULAR | Status: DC | PRN
  Administered 2019-02-26: 30 mg via INTRAVENOUS
  Filled 2019-02-26: qty 1

## 2019-02-26 MED ORDER — CALCIUM CARBONATE-VITAMIN D 500-200 MG-UNIT PO TABS
ORAL_TABLET | Freq: Two times a day (BID) | ORAL | Status: DC
  Administered 2019-02-26: 1 via ORAL
  Administered 2019-02-26: 2 via ORAL
  Filled 2019-02-26 (×2): qty 1

## 2019-02-26 NOTE — Consult Note (Addendum)
[facility]  CARDIOLOGY CONSULT NOTE  Patient ID: Ebony [name] MRN: [id number] DOB/AGE: 69/03/1951 69 y.o.  Admit date: 02/25/2019 Referring Physician Dr. Verdell Carmine Primary Physician [place] Primary Cardiologist None Reason for Consultation Chest discomfort, abnormal EKG  HPI:  Ebony [name] is a 68 y.o. with no known cardiac history, who presented to the ED yesterday after transfer from urgent care for an abnormal EKG. The patient woke up yesterday morning with a feeling of room spinning dizziness which was worse with movement and improved when lying and resting. She thought her symptoms were related to sinus infection, so she went to an urgent care for evaluation. During one of her episodes of dizziness, she reported having some chest tightness that felt similar to acid reflux. This prompted an EKG to be done in the urgent care which showed T wave inversions. She was then transferred to the ER for further cardiac workup. Patient states that she has not had any other chest discomfort. She has an active lifestyle, walking regularly for exercise. She denies any exertional chest pain with this activity. She also denies any associated shortness of breath, lightheadedness, syncope, or palpitations.   Review of systems complete and found to be negative unless listed above   Past Medical History:  Diagnosis Date  . Anemia   . Arthritis    knees, hands  . Borderline diabetes mellitus   . Complication of anesthesia    slow to wake  . Dry eye syndrome of both eyes   . Hypercholesterolemia   . Prehypertension 12/17/2016  . Wears dentures    full upper and lower    Past Surgical History:  Procedure Laterality Date  . ABDOMINAL HYSTERECTOMY     complete  . COLONOSCOPY WITH PROPOFOL N/A 05/09/2017   Procedure: COLONOSCOPY WITH PROPOFOL;  Surgeon: Lucilla Lame, MD;  Location: [facility];  Service: Endoscopy;  Laterality: N/A;  . EYE SURGERY  08/04/2017  . KNEE SURGERY  Bilateral     Medications Prior to Admission  Medication Sig Dispense Refill Last Dose  . acetaminophen (TYLENOL 8 HOUR) 650 MG CR tablet Take 650 mg by mouth every 8 (eight) hours as needed for pain.   Unknown at PRN  . aspirin 81 MG EC tablet Take 81 mg by mouth daily.    02/24/2019 at Unknown time  . B Complex-C (SUPER B COMPLEX PO) Take 1 tablet by mouth daily.   02/24/2019 at Unknown time  . Calcium Carbonate-Vit D-Min (CALCIUM 1200 PO) Take 600 mg by mouth 2 (two) times daily.   02/24/2019 at Unknown time  . Cascara Sagrada 450 MG CAPS Take 450 mg by mouth daily as needed.    Unknown at PRN  . Cyanocobalamin (VITAMIN B-12) 1000 MCG SUBL Place 1,000 mcg under the tongue once a week.    Past Week at Unknown time  . fluticasone (FLONASE) 50 MCG/ACT nasal spray SPRAY 2 SPRAYS INTO EACH NOSTRIL EVERY DAY (Patient taking differently: Place 2 sprays into both nostrils daily as needed for allergies or rhinitis. ) 48 mL 11 Unknown at PRN  . Homeopathic Products ([employer] LEG CRAMPS PAIN RELIEF PO) Take 1 tablet by mouth daily as needed (leg cramps).    Unknown at PRN  . OVER THE COUNTER MEDICATION Take 1,000 mg by mouth daily. **Black Cumin Seed Oil**     . OVER THE COUNTER MEDICATION Take 1 capsule by mouth as directed. [place]      . OVER THE COUNTER  MEDICATION Take 1 Product by mouth as directed. CholestOff Plus      . OVER THE COUNTER MEDICATION Take 1 Dose by mouth as directed. Sugar Metabolism Support  (Cinnamon 2000mg  plus Chromium)      . TURMERIC CURCUMIN PO Take 1 capsule by mouth 3 (three) times a week.   As directed at As directed  . famotidine (PEPCID) 20 MG tablet Take 1 tablet (20 mg total) by mouth 2 (two) times daily. (Patient not taking: Reported on 02/25/2019) 60 tablet 5 Not Taking at Unknown time  . lidocaine (LIDODERM) 5 % Place 1 patch onto the skin daily. Remove & Discard patch within 12 hours or as directed by MD (Patient not taking: Reported on  02/25/2019) 30 patch 0 Not Taking at Unknown time   Social History   Socioeconomic History  . Marital status: Married    Spouse name: Not on file  . Number of children: 3  . Years of education: Not on file  . Highest education level: High school graduate  Occupational History  . Not on file  Social Needs  . Financial resource strain: Not hard at all  . Food insecurity    Worry: Never true    Inability: Never true  . Transportation needs    Medical: No    Non-medical: No  Tobacco Use  . Smoking status: Never Smoker  . Smokeless tobacco: Never Used  Substance and Sexual Activity  . Alcohol use: No  . Drug use: No  . Sexual activity: Not Currently  Lifestyle  . Physical activity    Days per week: 7 days    Minutes per session: 60 min  . Stress: Not at all  Relationships  . Social connections    Talks on phone: More than three times a week    Gets together: More than three times a week    Attends religious service: More than 4 times per year    Active member of club or organization: No    Attends meetings of clubs or organizations: Never    Relationship status: Married  . Intimate partner violence    Fear of current or ex partner: No    Emotionally abused: No    Physically abused: No    Forced sexual activity: No  Other Topics Concern  . Not on file  Social History Narrative  . Not on file    Family History  Problem Relation Age of Onset  . Hypertension Mother   . Stroke Mother        4 mini strokes  . Thyroid disease Mother   . Kidney failure Father   . Diabetes Father   . Hypertension Father   . Kidney disease Father   . Emphysema Maternal Grandmother   . Diabetes Maternal Grandmother   . Hypertension Maternal Grandmother   . Diabetes Maternal Grandfather   . Alcohol abuse Maternal Grandfather   . Diabetes Paternal Grandmother   . Heart attack Paternal Grandmother   . Emphysema Paternal Grandfather   . Diabetes Paternal Grandfather   . Heart disease  Paternal Grandfather   . Thyroid disease Sister   . Vitamin D deficiency Sister   . Arthritis Sister   . COPD Sister   . Fibromyalgia Sister   . Hypertension Sister   . Hyperlipidemia Sister   . Asthma Sister   . Thyroid disease Sister   . Breast cancer Other   . Cancer Neg Hx     Review of systems complete  and found to be negative unless listed above    PHYSICAL EXAM  General: Well developed, well nourished, in no acute distress HEENT:  Normocephalic and atramatic Neck:  No JVD.  Lungs: Clear bilaterally to auscultation and percussion. Heart: HRRR . Normal S1 and S2 without gallops or murmurs.  Extremities: No clubbing, cyanosis or edema.   Neuro: Alert and oriented X 3. Psych:  Good affect, responds appropriately  Labs:   Lab Results  Component Value Date   WBC 4.4 02/26/2019   HGB 12.0 02/26/2019   HCT 37.6 02/26/2019   MCV 88.1 02/26/2019   PLT 190 02/26/2019    Recent Labs  Lab 02/25/19 1336 02/25/19 1735  NA 139  --   K 3.9  --   CL 107  --   CO2 23  --   BUN 17  --   CREATININE 0.40* 0.40*  CALCIUM 9.0  --   GLUCOSE 139*  --    Lab Results  Component Value Date   TROPONINI <0.03 10/09/2015    Lab Results  Component Value Date   CHOL 226 (H) 02/26/2019   CHOL 226 (H) 12/30/2018   CHOL 219 (H) 07/01/2018   Lab Results  Component Value Date   HDL 87 02/26/2019   HDL 89 12/30/2018   HDL 79 07/01/2018   Lab Results  Component Value Date   LDLCALC 126 (H) 02/26/2019   LDLCALC 122 (H) 12/30/2018   LDLCALC 124 (H) 07/01/2018   Lab Results  Component Value Date   TRIG 63 02/26/2019   TRIG 54 12/30/2018   TRIG 67 07/01/2018   Lab Results  Component Value Date   CHOLHDL 2.6 02/26/2019   CHOLHDL 2.5 12/30/2018   CHOLHDL 2.8 07/01/2018   No results found for: LDLDIRECT    Radiology: Dg Chest 2 View  Result Date: 02/25/2019 CLINICAL DATA:  Chest pain EXAM: CHEST - 2 VIEW COMPARISON:  October 09, 2015 FINDINGS: Lungs are clear. Heart size  and pulmonary vascularity are normal. No adenopathy. There is degenerative change in the thoracic spine. No pneumothorax. IMPRESSION: No edema or consolidation.  Cardiac silhouette within normal limits. Electronically Signed   By: Lowella Grip III M.D.   On: 02/25/2019 15:19    ASSESSMENT AND PLAN:  Ms. Kingston is a 68 year old female with no significant cardiac history, who was admitted for abnormal EKG with T wave inversions and two episodes of atypical chest discomfort. The patient initially seen in urgent care for dizziness. She had endorsed chest discomfort during some of her dizziness episodes, prompting EKG. EKG found to have T wave inversions which were new as compared to prior. T wave inversions are seen throughout without any particular anatomic distribution. Her high sensitivity troponins were normal. She has no chest pain currently. Low suspicion for ACS or other acute cardiac etiology as cause of her chest discomfort.    Chest pain:  Patient reports two episodes of atypical chest discomfort since yesterday, which were described as feeling similar to acid reflux. Her two high sensitivity troponin levels have been <2. Her EKG did show diffuse T wave inversions which appear new as compared to prior EKGs, although not along any one anatomical distribution. She feels well now aside from ongoing room spinning dizziness. Has no chest pain currently. Consider ENT evaluation in outpatient setting for peripheral vertigo symptoms. Safe for discharge from a cardiac standpoint.  - No further cardiac diagnostics or interventions indicated at this time.  - Defer further ischemic  work up to the outpatient setting.  - Close follow up with Dr. Nehemiah Massed or me in the next 1-2 weeks.   The patient's history and exam findings were discussed with Dr. Nehemiah Massed. The plan was made in conjunction with Dr. Nehemiah Massed.  Signed: Hilbert Odor PA-C 02/26/2019, 9:22 AM   The patient has been interviewed and  examined. I agree with assessment and plan above. Serafina Royals MD [facility]

## 2019-02-26 NOTE — Progress Notes (Signed)
ED called to the unit to inform that patient is on the way up now.

## 2019-02-26 NOTE — Discharge Summary (Signed)
[facility] NAME: Ebony [name]    MR#:  [id number]  DATE OF BIRTH:  Nov 28, 1950  DATE OF ADMISSION:  02/25/2019 ADMITTING PHYSICIAN: Nicholes Mango, MD  DATE OF DISCHARGE: 02/26/2019  PRIMARY CARE PHYSICIAN: [facility]    ADMISSION DIAGNOSIS:  Diaphoresis [R61] Abnormal ECG [R94.31] Chest pain, unspecified type [R07.9] Chest pain [R07.9]  DISCHARGE DIAGNOSIS:  Active Problems:   Chest pain   SECONDARY DIAGNOSIS:   Past Medical History:  Diagnosis Date  . Anemia   . Arthritis    knees, hands  . Borderline diabetes mellitus   . Complication of anesthesia    slow to wake  . Dry eye syndrome of both eyes   . Hypercholesterolemia   . Prehypertension 12/17/2016  . Wears dentures    full upper and lower    HOSPITAL COURSE:   68 year old female with past medical history of diabetes, osteoarthritis, anemia, hypertension, hyperlipidemia who presented to the hospital with atypical chest pain and also a headache.  1.  Chest pain-patient presented to the hospital with atypical chest pain but given her past medical history and risk factors she was observed overnight in the hospital. -Patient had high-sensitivity troponins checked which were negative.  A cardiology consult was obtained and patient underwent an echocardiogram which showed normal ejection fraction with no wall motion abnormalities.  She is clinically chest pain-free and hemodynamically stable and therefore being discharged home with outpatient follow-up with cardiology.  2.  Headache-suspected to be musculoskeletal/related to migraines. -CT head was negative for acute pathology.  Patient was given some Toradol and has improved.  3.  Osteoporosis-patient will continue her calcium and vitamin D supplements.    4.  Chronic anemia-hemoglobin stable can be further followed as an outpatient.  DISCHARGE CONDITIONS:   Patient is stable for  discharge.  CONSULTS OBTAINED:    DRUG ALLERGIES:   Allergies  Allergen Reactions  . Celecoxib Anxiety and Itching    DISCHARGE MEDICATIONS:   Allergies as of 02/26/2019      Reactions   Celecoxib Anxiety, Itching      Medication List    STOP taking these medications   famotidine 20 MG tablet Commonly known as: Pepcid   lidocaine 5 % Commonly known as: Lidoderm     TAKE these medications   aspirin 81 MG EC tablet Take 81 mg by mouth daily.   CALCIUM 1200 PO Take 600 mg by mouth 2 (two) times daily.   C[place] 450 MG Caps Take 450 mg by mouth daily as needed.   [employer] LEG CRAMPS PAIN RELIEF PO Take 1 tablet by mouth daily as needed (leg cramps).   fluticasone 50 MCG/ACT nasal spray Commonly known as: FLONASE SPRAY 2 SPRAYS INTO EACH NOSTRIL EVERY DAY What changed:   how much to take  how to take this  when to take this  reasons to take this  additional instructions   OVER THE COUNTER MEDICATION Take 1,000 mg by mouth daily. **Black Cumin Seed Oil**   OVER THE COUNTER MEDICATION Take 1 capsule by mouth as directed. [place] supplement   OVER THE COUNTER MEDICATION Take 1 Product by mouth as directed. CholestOff Plus   OVER THE COUNTER MEDICATION Take 1 Dose by mouth as directed. Sugar Metabolism Support  (Cinnamon 2000mg  plus Chromium)   SUPER B COMPLEX PO Take 1 tablet by mouth daily.   TURMERIC CURCUMIN PO Take 1 capsule by mouth 3 (three)  times a week.   Tylenol 8 Hour 650 MG CR tablet Generic drug: acetaminophen Take 650 mg by mouth every 8 (eight) hours as needed for pain.   Vitamin B-12 1000 MCG Subl Place 1,000 mcg under the tongue once a week.         DISCHARGE INSTRUCTIONS:   DIET:  Regular diet  DISCHARGE CONDITION:  Stable  ACTIVITY:  Activity as tolerated  OXYGEN:  Home Oxygen: No.   Oxygen Delivery: room air  DISCHARGE LOCATION:  home   If you experience worsening of your admission  symptoms, develop shortness of breath, life threatening emergency, suicidal or homicidal thoughts you must seek medical attention immediately by calling 911 or calling your MD immediately  if symptoms less severe.  You Must read complete instructions/literature along with all the possible adverse reactions/side effects for all the Medicines you take and that have been prescribed to you. Take any new Medicines after you have completely understood and accpet all the possible adverse reactions/side effects.   Please note  You were cared for by a hospitalist during your hospital stay. If you have any questions about your discharge medications or the care you received while you were in the hospital after you are discharged, you can call the unit and asked to speak with the hospitalist on call if the hospitalist that took care of you is not available. Once you are discharged, your primary care physician will handle any further medical issues. Please note that NO REFILLS for any discharge medications will be authorized once you are discharged, as it is imperative that you return to your primary care physician (or establish a relationship with a primary care physician if you do not have one) for your aftercare needs so that they can reassess your need for medications and monitor your lab values.     Today   Still complains of a mild headache but chest pain has resolved.  Cardiac markers have been negative, echocardiogram showed normal ejection fraction.  CT head is negative.  Stable for discharge home today.  VITAL SIGNS:  Blood pressure 124/78, pulse 60, temperature 98.7 F (37.1 C), temperature source Oral, resp. rate 15, height 5\' 7"  (1.702 m), weight 63.7 kg, SpO2 98 %.  I/O:    Intake/Output Summary (Last 24 hours) at 02/26/2019 1533 Last data filed at 02/26/2019 0500 Gross per 24 hour  Intake -  Output 350 ml  Net -350 ml    PHYSICAL EXAMINATION:  GENERAL:  68 y.o.-year-old patient lying  in the bed with no acute distress.  EYES: Pupils equal, round, reactive to light and accommodation. No scleral icterus. Extraocular muscles intact.  HEENT: Head atraumatic, normocephalic. Oropharynx and nasopharynx clear.  NECK:  Supple, no jugular venous distention. No thyroid enlargement, no tenderness.  LUNGS: Normal breath sounds bilaterally, no wheezing, rales,rhonchi. No use of accessory muscles of respiration.  CARDIOVASCULAR: S1, S2 normal. No murmurs, rubs, or gallops.  ABDOMEN: Soft, non-tender, non-distended. Bowel sounds present. No organomegaly or mass.  EXTREMITIES: No pedal edema, cyanosis, or clubbing.  NEUROLOGIC: Cranial nerves II through XII are intact. No focal motor or sensory defecits b/l.  PSYCHIATRIC: The patient is alert and oriented x 3.  SKIN: No obvious rash, lesion, or ulcer.   DATA REVIEW:   CBC Recent Labs  Lab 02/26/19 0231  WBC 4.4  HGB 12.0  HCT 37.6  PLT 190    Chemistries  Recent Labs  Lab 02/25/19 1336 02/25/19 1735  NA 139  --  K 3.9  --   CL 107  --   CO2 23  --   GLUCOSE 139*  --   BUN 17  --   CREATININE 0.40* 0.40*  CALCIUM 9.0  --     Cardiac Enzymes No results for input(s): TROPONINI in the last 168 hours.  Microbiology Results  Results for orders placed or performed during the hospital encounter of 02/25/19  SARS CORONAVIRUS 2 (TAT 6-24 HRS) Nasopharyngeal Nasopharyngeal Swab     Status: None   Collection Time: 02/25/19  5:32 PM   Specimen: Nasopharyngeal Swab  Result Value Ref Range Status   SARS Coronavirus 2 NEGATIVE NEGATIVE Final    Comment: (NOTE) SARS-CoV-2 target nucleic acids are NOT DETECTED. The SARS-CoV-2 RNA is generally detectable in upper and lower respiratory specimens during the acute phase of infection. Negative results do not preclude SARS-CoV-2 infection, do not rule out co-infections with other pathogens, and should not be used as the sole basis for treatment or other patient management  decisions. Negative results must be combined with clinical observations, patient history, and epidemiological information. The expected result is Negative. Fact Sheet for Patients: SugarRoll.be Fact Sheet for Healthcare Providers: https://www.woods-mathews.com/ This test is not yet approved or cleared by the [place] FDA and  has been authorized for detection and/or diagnosis of SARS-CoV-2 by FDA under an Emergency Use Authorization (EUA). This EUA will remain  in effect (meaning this test can be used) for the duration of the COVID-19 declaration under Section 56 4(b)(1) of the Act, 21 U.S.C. section 360bbb-3(b)(1), unless the authorization is terminated or revoked sooner. Performed at [facility], [place] [address], [place], [place] [address]     RADIOLOGY:  Dg Chest 2 View  Result Date: 02/25/2019 CLINICAL DATA:  Chest pain EXAM: CHEST - 2 VIEW COMPARISON:  October 09, 2015 FINDINGS: Lungs are clear. Heart size and pulmonary vascularity are normal. No adenopathy. There is degenerative change in the thoracic spine. No pneumothorax. IMPRESSION: No edema or consolidation.  Cardiac silhouette within normal limits. Electronically Signed   By: Lowella Grip III M.D.   On: 02/25/2019 15:19   Ct Head Wo Contrast  Result Date: 02/26/2019 CLINICAL DATA:  Acute onset headache and dizziness 3 days ago. EXAM: CT HEAD WITHOUT CONTRAST TECHNIQUE: Contiguous axial images were obtained from the base of the skull through the vertex without intravenous contrast. COMPARISON:  None. FINDINGS: Brain: No evidence of acute infarction, hemorrhage, hydrocephalus, extra-axial collection or mass lesion/mass effect. Vascular: No hyperdense vessel or unexpected calcification. Skull: Intact.  No focal lesion. Sinuses/Orbits: Negative. Other: None. IMPRESSION: Normal head CT. Electronically Signed   By: Inge Rise M.D.   On: 02/26/2019 12:44      Management  plans discussed with the patient, family and they are in agreement.  CODE STATUS:     Code Status Orders  (From admission, onward)         Start     Ordered   02/25/19 1728  Full code  Continuous     02/25/19 1729         TOTAL TIME TAKING CARE OF THIS PATIENT: 40 minutes.    Henreitta Leber M.D on 02/26/2019 at 3:33 PM  Between 7am to 6pm - Pager - [phone]  After 6pm go to [email or web address] - [facility]  Office  [phone]  CC: Primary care physician; [place]

## 2019-02-26 NOTE — Progress Notes (Signed)
*  PRELIMINARY RESULTS* Echocardiogram 2D Echocardiogram has been performed.  Ebony [name] 02/26/2019, 10:32 AM

## 2019-02-26 NOTE — ED Notes (Signed)
Attempted to call report

## 2019-02-26 NOTE — Progress Notes (Signed)
Report received from [facility]

## 2019-03-03 ENCOUNTER — Ambulatory Visit (INDEPENDENT_AMBULATORY_CARE_PROVIDER_SITE_OTHER): Payer: Medicare HMO | Admitting: Family Medicine

## 2019-03-03 ENCOUNTER — Encounter: Payer: Self-pay | Admitting: Family Medicine

## 2019-03-03 ENCOUNTER — Other Ambulatory Visit: Payer: Self-pay

## 2019-03-03 VITALS — BP 138/80 | HR 73 | Temp 97.9°F | Resp 14 | Ht 68.0 in | Wt 143.4 lb

## 2019-03-03 DIAGNOSIS — H6983 Other specified disorders of Eustachian tube, bilateral: Secondary | ICD-10-CM

## 2019-03-03 DIAGNOSIS — E782 Mixed hyperlipidemia: Secondary | ICD-10-CM | POA: Diagnosis not present

## 2019-03-03 DIAGNOSIS — K219 Gastro-esophageal reflux disease without esophagitis: Secondary | ICD-10-CM

## 2019-03-03 DIAGNOSIS — R42 Dizziness and giddiness: Secondary | ICD-10-CM | POA: Diagnosis not present

## 2019-03-03 DIAGNOSIS — J324 Chronic pansinusitis: Secondary | ICD-10-CM | POA: Diagnosis not present

## 2019-03-03 DIAGNOSIS — R079 Chest pain, unspecified: Secondary | ICD-10-CM | POA: Diagnosis not present

## 2019-03-03 DIAGNOSIS — H6993 Unspecified Eustachian tube disorder, bilateral: Secondary | ICD-10-CM

## 2019-03-03 DIAGNOSIS — Z09 Encounter for follow-up examination after completed treatment for conditions other than malignant neoplasm: Secondary | ICD-10-CM | POA: Diagnosis not present

## 2019-03-03 MED ORDER — ROSUVASTATIN CALCIUM 20 MG PO TABS: 20.0000 mg | ORAL_TABLET | Freq: Every day | ORAL | 3 refills | Status: DC

## 2019-03-03 MED ORDER — FLUTICASONE PROPIONATE 50 MCG/ACT NA SUSP: 2.0000 | Freq: Every day | NASAL | 3 refills | Status: DC

## 2019-03-03 MED ORDER — MECLIZINE HCL 25 MG PO TABS: 25.0000 mg | ORAL_TABLET | Freq: Three times a day (TID) | ORAL | 1 refills | Status: DC | PRN

## 2019-03-03 MED ORDER — LEVOCETIRIZINE DIHYDROCHLORIDE 5 MG PO TABS: 5.0000 mg | ORAL_TABLET | Freq: Every evening | ORAL | 2 refills | Status: DC

## 2019-03-03 MED ORDER — PANTOPRAZOLE SODIUM 20 MG PO TBEC: 20.0000 mg | DELAYED_RELEASE_TABLET | ORAL | 1 refills | Status: DC

## 2019-03-03 NOTE — Patient Instructions (Addendum)
For your chest pain which may possibly be some GI issues -hold your cinnamon supplement and try and avoid other triggering foods and drinks, see the handout below  Start Protonix medicine, once daily every morning on empty stomach, take medicines, supplements or eat breakfast at least 30 minutes after taking Protonix This block some of the acid produced in her stomach and allows things to calm down, would like to see if you could do about a 2 to 4-week trial of Protonix daily  Follow-up with cardiology for all other cardiac aspects of your recent symptoms and hospitalization  For cholesterol -please see the handout on statin medications.  I have sent through prescription for Crestor 20 mg to take daily at bedtime.  You can start with dosing 2-3 times a week if you are concerned about the side effects and gradually increase up to daily dosing as you feel more comfortable or if you are tolerating the medicine well. Continue to work on your healthy diet and active lifestyle  For your headaches, sinus congestion, vertigo and ear symptoms  I suspect your swollen sinuses are causing some eustachian tube dysfunction  Want to adjust her treatment for this  Use Flonase once a day doing 2 sprays in each nostril, add an antihistamine once a day like Xyzal, Claritin, Allegra, Zyrtec. Ear symptoms tend to improve once chronic sinus issues are more controlled.  You can also try saline nasal spray to help moisten nasal passages and also try Sudafed or other decongestants to help with your sinus congestion and eustachian tube dysfunction  I would suggest a 1 to 2-week trial of these medicine changes and please let me know if you are not improving and I will refer you to ENT  For vertigo I hope it will improve when we treat your sinuses better but you can use meclizine 3 times a day as needed to help minimize your vertigo spells     Eustachian Tube Dysfunction  Eustachian tube dysfunction refers to a  condition in which a blockage develops in the narrow passage that connects the middle ear to the back of the nose (eustachian tube). The eustachian tube regulates air pressure in the middle ear by letting air move between the ear and nose. It also helps to drain fluid from the middle ear space. Eustachian tube dysfunction can affect one or both ears. When the eustachian tube does not function properly, air pressure, fluid, or both can build up in the middle ear. What are the causes? This condition occurs when the eustachian tube becomes blocked or cannot open normally. Common causes of this condition include:  Ear infections.  Colds and other infections that affect the nose, mouth, and throat (upper respiratory tract).  Allergies.  Irritation from cigarette smoke.  Irritation from stomach acid coming up into the esophagus (gastroesophageal reflux). The esophagus is the tube that carries food from the mouth to the stomach.  Sudden changes in air pressure, such as from descending in an airplane or scuba diving.  Abnormal growths in the nose or throat, such as: ? Growths that line the nose (nasal polyps). ? Abnormal growth of cells (tumors). ? Enlarged tissue at the back of the throat (adenoids). What increases the risk? You are more likely to develop this condition if:  You smoke.  You are overweight.  You are a child who has: ? Certain birth defects of the mouth, such as cleft palate. ? Large tonsils or adenoids. What are the signs or symptoms? Common  symptoms of this condition include:  A feeling of fullness in the ear.  Ear pain.  Clicking or popping noises in the ear.  Ringing in the ear.  Hearing loss.  Loss of balance.  Dizziness. Symptoms may get worse when the air pressure around you changes, such as when you travel to an area of high elevation, fly on an airplane, or go scuba diving. How is this diagnosed? This condition may be diagnosed based on:  Your  symptoms.  A physical exam of your ears, nose, and throat.  Tests, such as those that measure: ? The movement of your eardrum (tympanogram). ? Your hearing (audiometry). How is this treated? Treatment depends on the cause and severity of your condition.  In mild cases, you may relieve your symptoms by moving air into your ears. This is called "popping the ears."  In more severe cases, or if you have symptoms of fluid in your ears, treatment may include: ? Medicines to relieve congestion (decongestants). ? Medicines that treat allergies (antihistamines). ? Nasal sprays or ear drops that contain medicines that reduce swelling (steroids). ? A procedure to drain the fluid in your eardrum (myringotomy). In this procedure, a small tube is placed in the eardrum to:  Drain the fluid.  Restore the air in the middle ear space. ? A procedure to insert a balloon device through the nose to inflate the opening of the eustachian tube (balloon dilation). Follow these instructions at home: Lifestyle  Do not do any of the following until your health care provider approves: ? Travel to high altitudes. ? Fly in airplanes. ? Work in a Pension scheme manager or room. ? Scuba dive.  Do not use any products that contain nicotine or tobacco, such as cigarettes and e-cigarettes. If you need help quitting, ask your health care provider.  Keep your ears dry. Wear fitted earplugs during showering and bathing. Dry your ears completely after. General instructions  Take over-the-counter and prescription medicines only as told by your health care provider.  Use techniques to help pop your ears as recommended by your health care provider. These may include: ? Chewing gum. ? Yawning. ? Frequent, forceful swallowing. ? Closing your mouth, holding your nose closed, and gently blowing as if you are trying to blow air out of your nose.  Keep all follow-up visits as told by your health care provider. This is  important. Contact a health care provider if:  Your symptoms do not go away after treatment.  Your symptoms come back after treatment.  You are unable to pop your ears.  You have: ? A fever. ? Pain in your ear. ? Pain in your head or neck. ? Fluid draining from your ear.  Your hearing suddenly changes.  You become very dizzy.  You lose your balance. Summary  Eustachian tube dysfunction refers to a condition in which a blockage develops in the eustachian tube.  It can be caused by ear infections, allergies, inhaled irritants, or abnormal growths in the nose or throat.  Symptoms include ear pain, hearing loss, or ringing in the ears.  Mild cases are treated with maneuvers to unblock the ears, such as yawning or ear popping.  Severe cases are treated with medicines. Surgery may also be done (rare). This information is not intended to replace advice given to you by your health care provider. Make sure you discuss any questions you have with your health care provider. Document Released: 05/19/2015 Document Revised: 08/12/2017 Document Reviewed: 08/12/2017 Elsevier Patient Education  [place] for Gastroesophageal Reflux Disease, Adult When you have gastroesophageal reflux disease (GERD), the foods you eat and your eating habits are very important. Choosing the right foods can help ease your discomfort. Think about working with a nutrition specialist (dietitian) to help you make good choices. What are tips for following this plan?  Meals  Choose healthy foods that are low in fat, such as fruits, vegetables, whole grains, low-fat dairy products, and lean meat, fish, and poultry.  Eat small meals often instead of 3 large meals a day. Eat your meals slowly, and in a place where you are relaxed. Avoid bending over or lying down until 2-3 hours after eating.  Avoid eating meals 2-3 hours before bed.  Avoid drinking a lot of liquid with meals.  Cook  foods using methods other than frying. Bake, grill, or broil food instead.  Avoid or limit: ? Chocolate. ? Peppermint or spearmint. ? Alcohol. ? Pepper. ? Black and decaffeinated coffee. ? Black and decaffeinated tea. ? Bubbly (carbonated) soft drinks. ? Caffeinated energy drinks and soft drinks.  Limit high-fat foods such as: ? Fatty meat or fried foods. ? Whole milk, cream, butter, or ice cream. ? Nuts and nut butters. ? Pastries, donuts, and sweets made with butter or shortening.  Avoid foods that cause symptoms. These foods may be different for everyone. Common foods that cause symptoms include: ? Tomatoes. ? Oranges, lemons, and limes. ? Peppers. ? Spicy food. ? Onions and garlic. ? Vinegar. Lifestyle  Maintain a healthy weight. Ask your doctor what weight is healthy for you. If you need to lose weight, work with your doctor to do so safely.  Exercise for at least 30 minutes for 5 or more days each week, or as told by your doctor.  Wear loose-fitting clothes.  Do not smoke. If you need help quitting, ask your doctor.  Sleep with the head of your bed higher than your feet. Use a wedge under the mattress or blocks under the bed frame to raise the head of the bed. Summary  When you have gastroesophageal reflux disease (GERD), food and lifestyle choices are very important in easing your symptoms.  Eat small meals often instead of 3 large meals a day. Eat your meals slowly, and in a place where you are relaxed.  Limit high-fat foods such as fatty meat or fried foods.  Avoid bending over or lying down until 2-3 hours after eating.  Avoid peppermint and spearmint, caffeine, alcohol, and chocolate. This information is not intended to replace advice given to you by your health care provider. Make sure you discuss any questions you have with your health care provider. Document Released: 10/22/2011 Document Revised: 08/13/2018 Document Reviewed: 05/28/2016 Elsevier Patient  Education  2020 [employer].

## 2019-03-03 NOTE — Progress Notes (Signed)
Name: Ebony [name]   MRN: [id number]    DOB: 04-01-1951   Date:03/03/2019       Progress Note  Chief Complaint  Patient presents with   Hospitalization Follow-up    chest pain, abnormal EKG and dizziness     Subjective:   Ebony [name] is a 68 y.o. female, presents to clinic for transition of care after hospitalization for CP  Admit date: 02/25/2019 Discharge date: 02/26/2019 Transition of care - I do not see where it was done  Pt was admitted for atypical CP, admitted for CP obs due to comorbidities and PMHs New medications started per hospitalization include - none Labs due today are - none Pt feels good, but some sx continue like sinus pain, pressure, HA, dizziness, some chest discomfort and indigestion.  Pt is here with her husband, is new to me.  Patient's hospital course -presented with atypical chest pain serial high-sensitivity troponins were negative, cardiology was consulted and patient had echo performed in hospital which showed normal ejection fraction with no regional wall motion abnormalities.  She was chest pain-free, had been hemodynamically stable and was discharged with recommended outpatient cardiology follow-up.  During her hospitalization she did have headaches with a negative head CT was suspected to be muscle skeletal related to migraines it did improve with Toradol.  There is no change to her chronic anemia  The 10-year ASCVD risk score Mikey Bussing DC Jr., et al., 2013) is: 14.1%   Values used to calculate the score:     Age: 63 years     Sex: Female     Is Non-Hispanic African American: Yes     Diabetic: No     Tobacco smoker: No     Systolic Blood Pressure: 0000000 mmHg     Is BP treated: No     HDL Cholesterol: 87 mg/dL     Total Cholesterol: 226 mg/dL  Patient has history of hyperlipidemia, last labs were about 2 months ago done by provider who has since moved out of state.  Patient has been working on lifestyle changes diet and exercise.  LDL was  elevated and calculated 10-year cardiovascular risk with most recent labs was 14.1%.  No documentation of a statin allergy or intolerance discussed patient's thoughts on cholesterol medicines today.  In office visit we reviewed her last labs, her dietary efforts and her risk today patient has been eating oatmeal a lot more to try and improve her cholesterol.  She does have follow-up scheduled with Dr. Nehemiah Massed at [facility] on Friday. We went through her labs today her echo, CT scan of her head.  We discussed statins at length today.  Patient states she has been very hesitant to start a statin medication because when her husband took 1 it made him very confused and she was afraid to have the same side effect. Regarding chest pain she has had no exertional chest pain pressure, near syncope, she denies orthopnea, PND, lower extremity edema.   She has had some episodes of CP since being discharged from the hospital.  It feels like acid reflux and a quiver in central chest, last for "a minute or two" and it will go away, sometimes drinking sprite will help it resolve.  Feels like reflux, pressure or indigestion if she burps it will also go away. She does feel some flutter, skipping beats and sometimes heart beat feels very forceful or strong.  Discharge summary states that her Pepcid was discontinued in the hospital.  In the past with Dr. Sanda Klein she had been taking OTC GERD medicine as needed, she rarely uses and has never had a problem before -suspect this over-the-counter medicine was probably Pepcid and she just was not taking very much.  She denies any abdominal bloating, nausea, vomiting, diarrhea, constipation.    In the hospital and also since discharge she continues to deal with dizziness and some HA's Her dizzy spells occur when she gets up quickly or turns her head quickly, some spinning/dizziness but also sometimes feels like lightheadedness/near syncope. She has some chronic sinus issues pressure  in face and some ear, also has some fullness in her ears popping and clicking.  She is using Flonase 1 spray in each nostril twice a day she is not on any other medications other than her supplements.  When she does have dizzy episodes are brief only last a few seconds, not associated with palpitations, sweats, nausea, vomiting, visual disturbances.     Patient Active Problem List   Diagnosis Date Noted   Chest pain 02/25/2019   B12 deficiency 12/30/2018   Hyperlipidemia 07/01/2018   Prediabetes 07/01/2018   Adjustment disorder with mixed anxiety and depressed mood 10/11/2015   Heartburn 10/11/2015   Arthritis of knee, degenerative 07/18/2015    Past Surgical History:  Procedure Laterality Date   ABDOMINAL HYSTERECTOMY     complete   COLONOSCOPY WITH PROPOFOL N/A 05/09/2017   Procedure: COLONOSCOPY WITH PROPOFOL;  Surgeon: Lucilla Lame, MD;  Location: [facility];  Service: Endoscopy;  Laterality: N/A;   EYE SURGERY  08/04/2017   KNEE SURGERY Bilateral     Family History  Problem Relation Age of Onset   Hypertension Mother    Stroke Mother        4 mini strokes   Thyroid disease Mother    Kidney failure Father    Diabetes Father    Hypertension Father    Kidney disease Father    Emphysema Maternal Grandmother    Diabetes Maternal Grandmother    Hypertension Maternal Grandmother    Diabetes Maternal Grandfather    Alcohol abuse Maternal Grandfather    Diabetes Paternal Grandmother    Heart attack Paternal Grandmother    Emphysema Paternal Grandfather    Diabetes Paternal Grandfather    Heart disease Paternal Grandfather    Thyroid disease Sister    Vitamin D deficiency Sister    Arthritis Sister    COPD Sister    Fibromyalgia Sister    Hypertension Sister    Hyperlipidemia Sister    Asthma Sister    Thyroid disease Sister    Breast cancer Other    Cancer Neg Hx     Social History   Socioeconomic History    Marital status: Married    Spouse name: Not on file   Number of children: 3   Years of education: Not on file   Highest education level: High school graduate  Occupational History   Not on file  Social Needs   Financial resource strain: Not hard at all   Food insecurity    Worry: Never true    Inability: Never true   Transportation needs    Medical: No    Non-medical: No  Tobacco Use   Smoking status: Never Smoker   Smokeless tobacco: Never Used  Substance and Sexual Activity   Alcohol use: No   Drug use: No   Sexual activity: Not Currently  Lifestyle   Physical activity    Days per week:  7 days    Minutes per session: 60 min   Stress: Not at all  Relationships   Social connections    Talks on phone: More than three times a week    Gets together: More than three times a week    Attends religious service: More than 4 times per year    Active member of club or organization: No    Attends meetings of clubs or organizations: Never    Relationship status: Married   Intimate partner violence    Fear of current or ex partner: No    Emotionally abused: No    Physically abused: No    Forced sexual activity: No  Other Topics Concern   Not on file  Social History Narrative   Not on file     Current Outpatient Medications:    acetaminophen (TYLENOL 8 HOUR) 650 MG CR tablet, Take 650 mg by mouth every 8 (eight) hours as needed for pain., Disp: , Rfl:    aspirin 81 MG EC tablet, Take 81 mg by mouth daily. , Disp: , Rfl:    B Complex-C (SUPER B COMPLEX PO), Take 1 tablet by mouth daily., Disp: , Rfl:    Calcium Carbonate-Vit D-Min (CALCIUM 1200 PO), Take 600 mg by mouth 2 (two) times daily., Disp: , Rfl:    C[place] 450 MG CAPS, Take 450 mg by mouth daily as needed. , Disp: , Rfl:    Cyanocobalamin (VITAMIN B-12) 1000 MCG SUBL, Place 1,000 mcg under the tongue once a week. , Disp: , Rfl:    fluticasone (FLONASE) 50 MCG/ACT nasal spray, SPRAY 2  SPRAYS INTO EACH NOSTRIL EVERY DAY (Patient taking differently: Place 2 sprays into both nostrils daily as needed for allergies or rhinitis. ), Disp: 48 mL, Rfl: 11   Homeopathic Products ([employer] LEG CRAMPS PAIN RELIEF PO), Take 1 tablet by mouth daily as needed (leg cramps). , Disp: , Rfl:    OVER THE COUNTER MEDICATION, Take 1,000 mg by mouth daily. **Black Cumin Seed Oil**, Disp: , Rfl:    OVER THE COUNTER MEDICATION, Take 1 capsule by mouth as directed. [place] supplement , Disp: , Rfl:    OVER THE COUNTER MEDICATION, Take 1 Product by mouth as directed. CholestOff Plus , Disp: , Rfl:    OVER THE COUNTER MEDICATION, Take 1 Dose by mouth as directed. Sugar Metabolism Support  (Cinnamon 2000mg  plus Chromium) , Disp: , Rfl:    TURMERIC CURCUMIN PO, Take 1 capsule by mouth 3 (three) times a week., Disp: , Rfl:   Allergies  Allergen Reactions   Celecoxib Anxiety and Itching    I personally reviewed active problem list, medication list, allergies, family history, social history, health maintenance, notes from last several encounters, lab results, imaging with the patient/caregiver today.  Review of Systems  Constitutional: Negative.   HENT: Negative.   Eyes: Negative.   Respiratory: Negative.   Cardiovascular: Negative.   Gastrointestinal: Negative.   Endocrine: Negative.   Genitourinary: Negative.   Musculoskeletal: Negative.   Skin: Negative.   Allergic/Immunologic: Negative.   Neurological: Negative.   Hematological: Negative.   Psychiatric/Behavioral: Negative.   All other systems reviewed and are negative.    Objective:    Vitals:   03/03/19 1027  BP: 138/80  Pulse: 73  Resp: 14  Temp: 97.9 F (36.6 C)  Weight: 143 lb 6.4 oz (65 kg)  Height: 5\' 8"  (1.727 m)    Body mass index is 21.8 kg/m.  Physical Exam Vitals signs and nursing note reviewed.  Constitutional:      General: She is not in acute distress.    Appearance: Normal appearance. She  is well-developed. She is not ill-appearing, toxic-appearing or diaphoretic.     Interventions: Face mask in place.  HENT:     Head: Normocephalic and atraumatic.     Right Ear: Hearing, tympanic membrane, ear canal and external ear normal. There is no impacted cerumen.     Left Ear: Hearing, tympanic membrane, ear canal and external ear normal. There is no impacted cerumen.     Nose: Mucosal edema, congestion and rhinorrhea present.     Right Sinus: No maxillary sinus tenderness or frontal sinus tenderness.     Left Sinus: No maxillary sinus tenderness or frontal sinus tenderness.     Comments: Bilateral severely enlarged and erythematous nasal turbinates    Mouth/Throat:     Mouth: Mucous membranes are moist. Mucous membranes are not pale.     Pharynx: Oropharynx is clear. Uvula midline. No oropharyngeal exudate, posterior oropharyngeal erythema or uvula swelling.     Tonsils: No tonsillar abscesses.  Eyes:     General: Lids are normal. No scleral icterus.       Right eye: No discharge.        Left eye: No discharge.     Conjunctiva/sclera: Conjunctivae normal.     Pupils: Pupils are equal, round, and reactive to light.  Neck:     Musculoskeletal: Normal range of motion and neck supple.     Trachea: Phonation normal. No tracheal deviation.  Cardiovascular:     Rate and Rhythm: Normal rate and regular rhythm.     Pulses: Normal pulses.          Radial pulses are 2+ on the right side and 2+ on the left side.       Posterior tibial pulses are 2+ on the right side and 2+ on the left side.     Heart sounds: Normal heart sounds. No murmur. No friction rub. No gallop.   Pulmonary:     Effort: Pulmonary effort is normal. No respiratory distress.     Breath sounds: Normal breath sounds. No stridor. No wheezing, rhonchi or rales.  Chest:     Chest wall: No tenderness.  Abdominal:     General: Bowel sounds are normal. There is no distension.     Palpations: Abdomen is soft.      Tenderness: There is no abdominal tenderness. There is no guarding or rebound.  Musculoskeletal: Normal range of motion.        General: No deformity.     Right lower leg: No edema.     Left lower leg: No edema.  Lymphadenopathy:     Cervical: No cervical adenopathy.  Skin:    General: Skin is warm and dry.     Capillary Refill: Capillary refill takes less than 2 seconds.     Coloration: Skin is not jaundiced or pale.     Findings: No rash.  Neurological:     General: No focal deficit present.     Mental Status: She is alert and oriented to person, place, and time.     Cranial Nerves: No cranial nerve deficit.     Sensory: No sensory deficit.     Motor: No weakness or abnormal muscle tone.     Coordination: Coordination normal.     Gait: Gait normal.  Psychiatric:        Mood  and Affect: Mood normal.        Speech: Speech normal.        Behavior: Behavior normal.      Recent Results (from the past 2160 hour(s))  HgB A1c     Status: Abnormal   Collection Time: 12/30/18 12:00 AM  Result Value Ref Range   Hgb A1c MFr Bld 6.0 (H) <5.7 % of total Hgb    Comment: For someone without known diabetes, a hemoglobin  A1c value between 5.7% and 6.4% is consistent with prediabetes and should be confirmed with a  follow-up test. . For someone with known diabetes, a value <7% indicates that their diabetes is well controlled. A1c targets should be individualized based on duration of diabetes, age, comorbid conditions, and other considerations. . This assay result is consistent with an increased risk of diabetes. . Currently, no consensus exists regarding use of hemoglobin A1c for diagnosis of diabetes for children. .    Mean Plasma Glucose 126 (calc)   eAG (mmol/L) 7.0 (calc)  Lipid Profile     Status: Abnormal   Collection Time: 12/30/18 12:00 AM  Result Value Ref Range   Cholesterol 226 (H) <200 mg/dL   HDL 89 > OR = 50 mg/dL   Triglycerides 54 <150 mg/dL   LDL Cholesterol  (Calc) 122 (H) mg/dL (calc)    Comment: Reference range: <100 . Desirable range <100 mg/dL for primary prevention;   <70 mg/dL for patients with CHD or diabetic patients  with > or = 2 CHD risk factors. Marland Kitchen LDL-C is now calculated using the Martin-Hopkins  calculation, which is a validated novel method providing  better accuracy than the Friedewald equation in the  estimation of LDL-C.  Cresenciano Genre et al. Annamaria Helling. [id number]): 2061-2068  (http://education.[facility].com/faq/FAQ164)    Total CHOL/HDL Ratio 2.5 <5.0 (calc)   Non-HDL Cholesterol (Calc) 137 (H) <130 mg/dL (calc)    Comment: For patients with diabetes plus 1 major ASCVD risk  factor, treating to a non-HDL-C goal of <100 mg/dL  (LDL-C of <70 mg/dL) is considered a therapeutic  option.   COMPLETE METABOLIC PANEL WITH GFR     Status: None   Collection Time: 12/30/18 12:00 AM  Result Value Ref Range   Glucose, Bld 81 65 - 99 mg/dL    Comment: .            Fasting reference interval .    BUN 14 7 - 25 mg/dL   Creat 0.61 0.50 - 0.99 mg/dL    Comment: For patients >72 years of age, the reference limit for Creatinine is approximately 13% higher for people identified as African-American. .    GFR, Est Non African American 92 > OR = 60 mL/min/1.37m2   GFR, Est African American 107 > OR = 60 mL/min/1.65m2   BUN/Creatinine Ratio NOT APPLICABLE 6 - 22 (calc)   Sodium 143 135 - 146 mmol/L   Potassium 4.4 3.5 - 5.3 mmol/L   Chloride 107 98 - 110 mmol/L   CO2 30 20 - 32 mmol/L   Calcium 9.1 8.6 - 10.4 mg/dL   Total Protein 6.6 6.1 - 8.1 g/dL   Albumin 4.0 3.6 - 5.1 g/dL   Globulin 2.6 1.9 - 3.7 g/dL (calc)   AG Ratio 1.5 1.0 - 2.5 (calc)   Total Bilirubin 0.7 0.2 - 1.2 mg/dL   Alkaline phosphatase (APISO) 65 37 - 153 U/L   AST 15 10 - 35 U/L   ALT 9 6 - 29 U/L  Vitamin B12     Status: Abnormal   Collection Time: 12/30/18 12:00 AM  Result Value Ref Range   Vitamin B-12 1,626 (H) 200 - 1,100 pg/mL  Glucose, capillary      Status: Abnormal   Collection Time: 02/25/19 11:54 AM  Result Value Ref Range   Glucose-Capillary 130 (H) 70 - 99 mg/dL  Basic metabolic panel     Status: Abnormal   Collection Time: 02/25/19  1:36 PM  Result Value Ref Range   Sodium 139 135 - 145 mmol/L   Potassium 3.9 3.5 - 5.1 mmol/L   Chloride 107 98 - 111 mmol/L   CO2 23 22 - 32 mmol/L   Glucose, Bld 139 (H) 70 - 99 mg/dL   BUN 17 8 - 23 mg/dL   Creatinine, Ser 0.40 (L) 0.44 - 1.00 mg/dL   Calcium 9.0 8.9 - 10.3 mg/dL   GFR calc non Af Amer >60 >60 mL/min   GFR calc Af Amer >60 >60 mL/min   Anion gap 9 5 - 15    Comment: Performed at [facility], [address] [place], [place] [address]  CBC     Status: None   Collection Time: 02/25/19  1:36 PM  Result Value Ref Range   WBC 4.1 4.0 - 10.5 K/uL   RBC 4.62 3.87 - 5.11 MIL/uL   Hemoglobin 13.1 12.0 - 15.0 g/dL   HCT 40.7 36.0 - 46.0 %   MCV 88.1 80.0 - 100.0 fL   MCH 28.4 26.0 - 34.0 pg   MCHC 32.2 30.0 - 36.0 g/dL   RDW 13.5 11.5 - 15.5 %   Platelets 186 150 - 400 K/uL   nRBC 0.0 0.0 - 0.2 %    Comment: Performed at [facility], [address] [place], [place] [address]  Brain natriuretic peptide     Status: None   Collection Time: 02/25/19  1:36 PM  Result Value Ref Range   B Natriuretic Peptide 54.0 0.0 - 100.0 pg/mL    Comment: Performed at [facility], [place], [place] [address]  Troponin I (High Sensitivity)     Status: None   Collection Time: 02/25/19  1:36 PM  Result Value Ref Range   Troponin I (High Sensitivity) <2 <18 ng/L    Comment: (NOTE) Elevated high sensitivity troponin I (hsTnI) values and significant  changes across serial measurements may suggest ACS but many other  chronic and acute conditions are known to elevate hsTnI results.  Refer to the "Links" section for chest pain algorithms and additional  guidance. Performed at [facility], [place], [place] [address]     Troponin I (High Sensitivity)     Status: None   Collection Time: 02/25/19  5:32 PM  Result Value Ref Range   Troponin I (High Sensitivity) <2 <18 ng/L    Comment: (NOTE) Elevated high sensitivity troponin I (hsTnI) values and significant  changes across serial measurements may suggest ACS but many other  chronic and acute conditions are known to elevate hsTnI results.  Refer to the "Links" section for chest pain algorithms and additional  guidance. Performed at [facility], [place], [place] [address]   SARS CORONAVIRUS 2 (TAT 6-24 HRS) Nasopharyngeal Nasopharyngeal Swab     Status: None   Collection Time: 02/25/19  5:32 PM   Specimen: Nasopharyngeal Swab  Result Value Ref Range   SARS Coronavirus 2 NEGATIVE NEGATIVE    Comment: (  NOTE) SARS-CoV-2 target nucleic acids are NOT DETECTED. The SARS-CoV-2 RNA is generally detectable in upper and lower respiratory specimens during the acute phase of infection. Negative results do not preclude SARS-CoV-2 infection, do not rule out co-infections with other pathogens, and should not be used as the sole basis for treatment or other patient management decisions. Negative results must be combined with clinical observations, patient history, and epidemiological information. The expected result is Negative. Fact Sheet for Patients: SugarRoll.be Fact Sheet for Healthcare Providers: https://www.woods-mathews.com/ This test is not yet approved or cleared by the [place] FDA and  has been authorized for detection and/or diagnosis of SARS-CoV-2 by FDA under an Emergency Use Authorization (EUA). This EUA will remain  in effect (meaning this test can be used) for the duration of the COVID-19 declaration under Section 56 4(b)(1) of the Act, 21 U.S.C. section 360bbb-3(b)(1), unless the authorization is terminated or revoked sooner. Performed at [facility], [place]  [address], [place], [place] [address]   HIV Antibody (routine testing w rflx)     Status: None   Collection Time: 02/25/19  5:35 PM  Result Value Ref Range   HIV Screen 4th Generation wRfx NON REACTIVE NON REACTIVE    Comment: Performed at [facility], [place] [address], [place], [place] [address]  Creatinine, serum     Status: Abnormal   Collection Time: 02/25/19  5:35 PM  Result Value Ref Range   Creatinine, Ser 0.40 (L) 0.44 - 1.00 mg/dL   GFR calc non Af Amer >60 >60 mL/min   GFR calc Af Amer >60 >60 mL/min    Comment: Performed at [facility], [address] [place], [place] [address]  Lipid panel     Status: Abnormal   Collection Time: 02/26/19  2:31 AM  Result Value Ref Range   Cholesterol 226 (H) 0 - 200 mg/dL   Triglycerides 63 <150 mg/dL   HDL 87 >40 mg/dL   Total CHOL/HDL Ratio 2.6 RATIO   VLDL 13 0 - 40 mg/dL   LDL Cholesterol 126 (H) 0 - 99 mg/dL    Comment:        Total Cholesterol/HDL:CHD Risk Coronary Heart Disease Risk Table                     Men   Women  1/2 Average Risk   3.4   3.3  Average Risk       5.0   4.4  2 X Average Risk   9.6   7.1  3 X Average Risk  23.4   11.0        Use the calculated Patient Ratio above and the CHD Risk Table to determine the patient's CHD Risk.        ATP III CLASSIFICATION (LDL):  <100     mg/dL   Optimal  100-129  mg/dL   Near or Above                    Optimal  130-159  mg/dL   Borderline  160-189  mg/dL   High  >190     mg/dL   Very High Performed at [facility], [address] [place], [place] [address]   CBC     Status: None   Collection Time: 02/26/19  2:31 AM  Result Value Ref Range   WBC 4.4 4.0 - 10.5 K/uL   RBC 4.27 3.87 - 5.11 MIL/uL   Hemoglobin  12.0 12.0 - 15.0 g/dL   HCT 37.6 36.0 - 46.0 %   MCV 88.1 80.0 - 100.0 fL   MCH 28.1 26.0 - 34.0 pg   MCHC 31.9 30.0 - 36.0 g/dL   RDW 13.6 11.5 - 15.5 %   Platelets 190 150 - 400 K/uL   nRBC 0.0 0.0 - 0.2 %    Comment: Performed at  [facility], [address] [place], [place] [address]  ECHOCARDIOGRAM COMPLETE     Status: None   Collection Time: 02/26/19 10:32 AM  Result Value Ref Range   Weight 2,246.4 oz   Height 67 in   BP 115/74 mmHg    PHQ2/9: Depression screen [facility] 2/9 03/03/2019 12/30/2018 07/01/2018 04/24/2018 12/12/2017  Decreased Interest 0 0 0 0 0  Down, Depressed, Hopeless 0 0 0 0 0  PHQ - 2 Score 0 0 0 0 0  Altered sleeping 0 0 0 - -  Tired, decreased energy 0 0 0 - -  Change in appetite 0 0 0 - -  Feeling bad or failure about yourself  0 0 0 - -  Trouble concentrating 0 0 0 - -  Moving slowly or fidgety/restless 0 0 0 - -  Suicidal thoughts 0 0 0 - -  PHQ-9 Score 0 0 0 - -  Difficult doing work/chores Not difficult at all Not difficult at all - - -    phq 9 is negative Reviewed today  Fall Risk: Fall Risk  03/03/2019 12/30/2018 07/01/2018 04/24/2018 12/12/2017  Falls in the past year? 0 0 0 0 No  Number falls in past yr: 0 0 0 0 -  Injury with Fall? 0 0 0 - -  Follow up - Falls evaluation completed - - -      Functional Status Survey: Is the patient deaf or have difficulty hearing?: No Does the patient have difficulty seeing, even when wearing glasses/contacts?: Yes Does the patient have difficulty concentrating, remembering, or making decisions?: No Does the patient have difficulty walking or climbing stairs?: No Does the patient have difficulty dressing or bathing?: No Does the patient have difficulty doing errands alone such as visiting a doctor's office or shopping?: No    Assessment & Plan:       ICD-10-CM   1. Chest pain, unspecified type  R07.9    Improved has been chest pain-free since discharge, is going to follow-up with cardiology  2. Mixed hyperlipidemia  E78.2 rosuvastatin (CRESTOR) 20 MG tablet   Poorly controlled last labs in the past 2 months, discussed her risk and benefits of statin tx, discussed med benefits, SE at length today, agrees to start   3.  Encounter for examination following treatment at hospital  Z09    all ER and hospital records reviewed, labs, imaging, ECHO, CT head  4. Vertigo  R42 meclizine (ANTIVERT) 25 MG tablet   peripheral, reproducible - BPPV vs labyrinthitis - meclizine prn  5. Chronic pansinusitis  J32.4 levocetirizine (XYZAL) 5 MG tablet    fluticasone (FLONASE) 50 MCG/ACT nasal spray   worse with recent allergies, adjust flonase to once a day, add 2nd generation antihistamine, decongestants and saline nasal spray PRN, f/up 2 weeks  6. Eustachian tube dysfunction, bilateral  H69.83 levocetirizine (XYZAL) 5 MG tablet    fluticasone (FLONASE) 50 MCG/ACT nasal spray   See above treating sinuses hope eustachian tube dysfunction will improve, patient can take decongestants if she tolerates  7. Gastroesophageal reflux disease, unspecified whether esophagitis present  K21.9 pantoprazole (PROTONIX) 20 MG tablet   suspect CP may be some GERD sx, with indigestion, improves with belching, trial protonix in am    Return for 1 month f/up on new meds, and 3 month routine f/up on chronic issues.    Greater than 50% of this visit was spent in direct face-to-face counseling, obtaining history and physical, discussing and educating pt on treatment plan.  Total time of this visit was 50 min.  Remainder of time involved but was not limited to reviewing chart (recent and pertinent OV notes and labs), documentation in EMR, and coordinating care and treatment plan.  Extensive discussion about statins to decrease cardiovascular risk and treat her cholesterol.  Also reviewed all of her images thoroughly, discussed her sinuses, vertigo, headaches, in addition to reviewing her hospitalization, hospital records and doing the hospital follow-up today.   Delsa Grana, PA-C 03/03/19 10:35 AM

## 2019-03-11 DIAGNOSIS — E7800 Pure hypercholesterolemia, unspecified: Secondary | ICD-10-CM | POA: Diagnosis not present

## 2019-03-11 DIAGNOSIS — I208 Other forms of angina pectoris: Secondary | ICD-10-CM | POA: Diagnosis not present

## 2019-03-25 ENCOUNTER — Other Ambulatory Visit: Payer: Self-pay | Admitting: Family Medicine

## 2019-03-25 DIAGNOSIS — K219 Gastro-esophageal reflux disease without esophagitis: Secondary | ICD-10-CM

## 2019-03-25 DIAGNOSIS — I208 Other forms of angina pectoris: Secondary | ICD-10-CM | POA: Diagnosis not present

## 2019-03-30 DIAGNOSIS — E7800 Pure hypercholesterolemia, unspecified: Secondary | ICD-10-CM | POA: Diagnosis not present

## 2019-03-30 DIAGNOSIS — R002 Palpitations: Secondary | ICD-10-CM | POA: Diagnosis not present

## 2019-03-30 DIAGNOSIS — R7303 Prediabetes: Secondary | ICD-10-CM | POA: Diagnosis not present

## 2019-04-05 DIAGNOSIS — H16223 Keratoconjunctivitis sicca, not specified as Sjogren's, bilateral: Secondary | ICD-10-CM | POA: Diagnosis not present

## 2019-04-06 ENCOUNTER — Other Ambulatory Visit: Payer: Self-pay

## 2019-04-06 ENCOUNTER — Ambulatory Visit (INDEPENDENT_AMBULATORY_CARE_PROVIDER_SITE_OTHER): Payer: Medicare HMO | Admitting: Family Medicine

## 2019-04-06 DIAGNOSIS — Z5329 Procedure and treatment not carried out because of patient's decision for other reasons: Secondary | ICD-10-CM

## 2019-04-06 DIAGNOSIS — Z91199 Patient's noncompliance with other medical treatment and regimen due to unspecified reason: Secondary | ICD-10-CM

## 2019-04-06 NOTE — Progress Notes (Signed)
Name: Ebony [name]   MRN: [id number]    DOB: Apr 08, 1951   Date:04/06/2019       Progress Note  Subjective:    Chief Complaint  No chief complaint on file.   Did not connect with  [name]  on 04/06/19 at  3:20 PM EST for a video enabled telemedicine application  Note was prepped, but pt no show HPI   Patient presents for routine follow-up on her chronic conditions  Hyperlipidemia:  Current Medication Regimen: Last Lipids: Lab Results  Component Value Date   CHOL 226 (H) 02/26/2019   HDL 87 02/26/2019   LDLCALC 126 (H) 02/26/2019   TRIG 63 02/26/2019   CHOLHDL 2.6 02/26/2019   - Current Diet: - : Chest pain, shortness of breath, myalgias. - Documented aortic atherosclerosis?  - Risk factors for atherosclerosis:   Prediabetes: Lab Results  Component Value Date   HGBA1C 6.0 (H) 12/30/2018  not on any meds, managed with lifestyle and a variety of supplements which the pt has chosen to use.    Also presents for 1 month follow-up on atypical chest pain and dizziness I first met and evaluated patient about 1 month ago after she was briefly hospitalized for atypical chest pain 1. Chest pain, unspecified type  R07.9    Improved has been chest pain-free since discharge, is going to follow-up with cardiology  2. Mixed hyperlipidemia  E78.2 rosuvastatin (CRESTOR) 20 MG tablet   Poorly controlled last labs in the past 2 months, discussed her risk and benefits of statin tx, discussed med benefits, SE at length today, agrees to start   3. Encounter for examination following treatment at hospital  Z09    all ER and hospital records reviewed, labs, imaging, ECHO, CT head  4. Vertigo  R42 meclizine (ANTIVERT) 25 MG tablet   peripheral, reproducible - BPPV vs labyrinthitis - meclizine prn  5. Chronic pansinusitis  J32.4 levocetirizine (XYZAL) 5 MG tablet    fluticasone (FLONASE) 50 MCG/ACT nasal spray   worse with recent allergies, adjust flonase to once a day,  add 2nd generation antihistamine, decongestants and saline nasal spray PRN, f/up 2 weeks  6. Eustachian tube dysfunction, bilateral  H69.83 levocetirizine (XYZAL) 5 MG tablet    fluticasone (FLONASE) 50 MCG/ACT nasal spray   See above treating sinuses hope eustachian tube dysfunction will improve, patient can take decongestants if she tolerates  7. Gastroesophageal reflux disease, unspecified whether esophagitis present  K21.9 pantoprazole (PROTONIX) 20 MG tablet   suspect CP may be some GERD sx, with indigestion, improves with belching, trial protonix in am    Copied above is the A&P from that visit I suspect that her dizziness may be secondary to eustachian tube dysfunction and seasonal allergies/rhinosinusitis.  Today she is here to follow-up on dizziness, eustachian tube dysfunction, chronic sinusitis after starting Flonase, antihistamines and as needed decongestants    Chest pain and was more suspicious for GERD/esophagitis -also here today to follow-up on Protonix trial    Patient Active Problem List   Diagnosis Date Noted  . Chest pain 02/25/2019  . B12 deficiency 12/30/2018  . Hyperlipidemia 07/01/2018  . Prediabetes 07/01/2018  . Adjustment disorder with mixed anxiety and depressed mood 10/11/2015  . Heartburn 10/11/2015  . Arthritis of knee, degenerative 07/18/2015    Past Surgical History:  Procedure Laterality Date  . ABDOMINAL HYSTERECTOMY     complete  . COLONOSCOPY WITH PROPOFOL N/A 05/09/2017   Procedure: COLONOSCOPY WITH PROPOFOL;  Surgeon: Allen Norris,  Darren, MD;  Location: [facility];  Service: Endoscopy;  Laterality: N/A;  . EYE SURGERY  08/04/2017  . KNEE SURGERY Bilateral     Family History  Problem Relation Age of Onset  . Hypertension Mother   . Stroke Mother        4 mini strokes  . Thyroid disease Mother   . Kidney failure Father   . Diabetes Father   . Hypertension Father   . Kidney disease Father   . Emphysema Maternal Grandmother   .  Diabetes Maternal Grandmother   . Hypertension Maternal Grandmother   . Diabetes Maternal Grandfather   . Alcohol abuse Maternal Grandfather   . Diabetes Paternal Grandmother   . Heart attack Paternal Grandmother   . Emphysema Paternal Grandfather   . Diabetes Paternal Grandfather   . Heart disease Paternal Grandfather   . Thyroid disease Sister   . Vitamin D deficiency Sister   . Arthritis Sister   . COPD Sister   . Fibromyalgia Sister   . Hypertension Sister   . Hyperlipidemia Sister   . Asthma Sister   . Thyroid disease Sister   . Breast cancer Other   . Cancer Neg Hx     Social History   Socioeconomic History  . Marital status: Married    Spouse name: Not on file  . Number of children: 3  . Years of education: Not on file  . Highest education level: High school graduate  Occupational History  . Not on file  Social Needs  . Financial resource strain: Not hard at all  . Food insecurity    Worry: Never true    Inability: Never true  . Transportation needs    Medical: No    Non-medical: No  Tobacco Use  . Smoking status: Never Smoker  . Smokeless tobacco: Never Used  Substance and Sexual Activity  . Alcohol use: No  . Drug use: No  . Sexual activity: Not Currently  Lifestyle  . Physical activity    Days per week: 7 days    Minutes per session: 60 min  . Stress: Not at all  Relationships  . Social connections    Talks on phone: More than three times a week    Gets together: More than three times a week    Attends religious service: More than 4 times per year    Active member of club or organization: No    Attends meetings of clubs or organizations: Never    Relationship status: Married  . Intimate partner violence    Fear of current or ex partner: No    Emotionally abused: No    Physically abused: No    Forced sexual activity: No  Other Topics Concern  . Not on file  Social History Narrative  . Not on file     Current Outpatient Medications:  .   acetaminophen (TYLENOL 8 HOUR) 650 MG CR tablet, Take 650 mg by mouth every 8 (eight) hours as needed for pain., Disp: , Rfl:  .  aspirin 81 MG EC tablet, Take 81 mg by mouth daily. , Disp: , Rfl:  .  B Complex-C (SUPER B COMPLEX PO), Take 1 tablet by mouth daily., Disp: , Rfl:  .  Calcium Carbonate-Vit D-Min (CALCIUM 1200 PO), Take 600 mg by mouth 2 (two) times daily., Disp: , Rfl:  .  [place] 450 MG CAPS, Take 450 mg by mouth daily as needed. , Disp: , Rfl:  .  Cyanocobalamin (VITAMIN B-12) 1000 MCG SUBL, Place 1,000 mcg under the tongue once a week. , Disp: , Rfl:  .  fluticasone (FLONASE) 50 MCG/ACT nasal spray, Place 2 sprays into both nostrils daily., Disp: 16 g, Rfl: 3 .  Homeopathic Products ([employer] LEG CRAMPS PAIN RELIEF PO), Take 1 tablet by mouth daily as needed (leg cramps). , Disp: , Rfl:  .  levocetirizine (XYZAL) 5 MG tablet, Take 1 tablet (5 mg total) by mouth every evening., Disp: 30 tablet, Rfl: 2 .  meclizine (ANTIVERT) 25 MG tablet, Take 1 tablet (25 mg total) by mouth 3 (three) times daily as needed for dizziness., Disp: 30 tablet, Rfl: 1 .  OVER THE COUNTER MEDICATION, Take 1,000 mg by mouth daily. **Black Cumin Seed Oil**, Disp: , Rfl:  .  OVER THE COUNTER MEDICATION, Take 1 capsule by mouth as directed. VisionShield Eye Health supplement , Disp: , Rfl:  .  OVER THE COUNTER MEDICATION, Take 1 Product by mouth as directed. CholestOff Plus , Disp: , Rfl:  .  OVER THE COUNTER MEDICATION, Take 1 Dose by mouth as directed. Sugar Metabolism Support  (Cinnamon 2016m plus Chromium) , Disp: , Rfl:  .  pantoprazole (PROTONIX) 20 MG tablet, Take 1 tablet (20 mg total) by mouth every morning. One hour before breakfast, Disp: 30 tablet, Rfl: 1 .  rosuvastatin (CRESTOR) 20 MG tablet, Take 1 tablet (20 mg total) by mouth at bedtime., Disp: 90 tablet, Rfl: 3 .  TURMERIC CURCUMIN PO, Take 1 capsule by mouth 3 (three) times a week., Disp: , Rfl:   Allergies  Allergen Reactions  .  Celecoxib Anxiety and Itching      Review of Systems    Objective:    Virtual encounter, vitals limited, only able to obtain the following There were no vitals filed for this visit. There is no height or weight on file to calculate BMI. Nursing Note and Vital Signs reviewed.  Physical Exam  PE limited by telephone encounter  No results found for this or any previous visit (from the past 72 hour(s)).  PHQ2/9: Depression screen [place]/9 03/03/2019 12/30/2018 07/01/2018 04/24/2018 12/12/2017  Decreased Interest 0 0 0 0 0  Down, Depressed, Hopeless 0 0 0 0 0  PHQ - 2 Score 0 0 0 0 0  Altered sleeping 0 0 0 - -  Tired, decreased energy 0 0 0 - -  Change in appetite 0 0 0 - -  Feeling bad or failure about yourself  0 0 0 - -  Trouble concentrating 0 0 0 - -  Moving slowly or fidgety/restless 0 0 0 - -  Suicidal thoughts 0 0 0 - -  PHQ-9 Score 0 0 0 - -  Difficult doing work/chores Not difficult at all Not difficult at all - - -     Fall Risk: Fall Risk  03/03/2019 12/30/2018 07/01/2018 04/24/2018 12/12/2017  Falls in the past year? 0 0 0 0 No  Number falls in past yr: 0 0 0 0 -  Injury with Fall? 0 0 0 - -  Follow up - Falls evaluation completed - - -     Assessment and Plan:   Problem List Items Addressed This Visit    None       I discussed the assessment and treatment plan with the patient. The patient was provided an opportunity to ask questions and all were answered. The patient agreed with the plan and demonstrated an understanding of the instructions.  The  patient was advised to call back or seek an in-person evaluation if the symptoms worsen or if the condition fails to improve as anticipated.   Delsa Grana, PA-C 12/01/203:09 PM

## 2019-04-14 DIAGNOSIS — H16223 Keratoconjunctivitis sicca, not specified as Sjogren's, bilateral: Secondary | ICD-10-CM | POA: Diagnosis not present

## 2019-05-03 ENCOUNTER — Telehealth: Payer: Self-pay | Admitting: Family Medicine

## 2019-05-04 ENCOUNTER — Ambulatory Visit (INDEPENDENT_AMBULATORY_CARE_PROVIDER_SITE_OTHER): Payer: Medicare HMO

## 2019-05-04 ENCOUNTER — Other Ambulatory Visit: Payer: Self-pay

## 2019-05-04 VITALS — Temp 97.3°F | Ht 68.0 in | Wt 145.0 lb

## 2019-05-04 DIAGNOSIS — Z0000 Encounter for general adult medical examination without abnormal findings: Secondary | ICD-10-CM | POA: Diagnosis not present

## 2019-05-04 DIAGNOSIS — Z780 Asymptomatic menopausal state: Secondary | ICD-10-CM | POA: Diagnosis not present

## 2019-05-04 DIAGNOSIS — Z1231 Encounter for screening mammogram for malignant neoplasm of breast: Secondary | ICD-10-CM

## 2019-05-04 NOTE — Progress Notes (Signed)
Subjective:   Ebony [name] is a 68 y.o. female who presents for Medicare Annual (Subsequent) preventive examination.  Virtual Visit via Telephone Note  I connected with Ebony [name] on 05/04/19 at 10:00 AM EST by telephone and verified that I am speaking with the correct person using two identifiers.  Medicare Annual Wellness visit completed telephonically due to Covid-19 pandemic.   Location: Patient: home Provider: office   I discussed the limitations, risks, security and privacy concerns of performing an evaluation and management service by telephone and the availability of in person appointments. The patient expressed understanding and agreed to proceed.  Some vital signs may be absent or patient reported.   Clemetine Marker, LPN    Review of Systems:   Cardiac Risk Factors include: advanced age (>28men, >71 women);dyslipidemia     Objective:     Vitals: Temp (!) 97.3 F (36.3 C)   Ht 5\' 8"  (1.727 m)   Wt 145 lb (65.8 kg)   BMI 22.05 kg/m   Body mass index is 22.05 kg/m.  Advanced Directives 05/04/2019 02/25/2019 02/25/2019 09/12/2018 04/24/2018 05/09/2017 02/04/2017  Does Patient Have a Medical Advance Directive? No No No No No No No  Would patient like information on creating a medical advance directive? Yes (MAU/Ambulatory/Procedural Areas - Information given) No - Patient declined - - Yes (MAU/Ambulatory/Procedural Areas - Information given) Yes (MAU/Ambulatory/Procedural Areas - Information given) -    Tobacco Social History   Tobacco Use  Smoking Status Never Smoker  Smokeless Tobacco Never Used     Counseling given: Not Answered   Clinical Intake:  Pre-visit preparation completed: Yes  Pain : 0-10 Pain Score: 8  Pain Type: Chronic pain Pain Location: Hand(knees) Pain Orientation: Right, Left Pain Descriptors / Indicators: Aching, Discomfort, Sore Pain Onset: More than a month ago Pain Frequency: Constant     BMI - recorded:  22.05 Nutritional Status: BMI of 19-24  Normal Nutritional Risks: None Diabetes: No  How often do you need to have someone help you when you read instructions, pamphlets, or other written materials from your doctor or pharmacy?: 1 - Never  Interpreter Needed?: No  Information entered by :: Clemetine Marker LPN  Past Medical History:  Diagnosis Date  . Allergy   . Anemia   . Arthritis    knees, hands  . Borderline diabetes mellitus   . Complication of anesthesia    slow to wake  . Dry eye syndrome of both eyes   . Hypercholesterolemia   . Prehypertension 12/17/2016  . Wears dentures    full upper and lower   Past Surgical History:  Procedure Laterality Date  . ABDOMINAL HYSTERECTOMY     complete  . COLONOSCOPY WITH PROPOFOL N/A 05/09/2017   Procedure: COLONOSCOPY WITH PROPOFOL;  Surgeon: Lucilla Lame, MD;  Location: [facility];  Service: Endoscopy;  Laterality: N/A;  . EYE SURGERY  08/04/2017  . KNEE SURGERY Bilateral    Family History  Problem Relation Age of Onset  . Hypertension Mother   . Stroke Mother        4 mini strokes  . Thyroid disease Mother   . Kidney failure Father   . Diabetes Father   . Hypertension Father   . Kidney disease Father   . Emphysema Maternal Grandmother   . Diabetes Maternal Grandmother   . Hypertension Maternal Grandmother   . Diabetes Maternal Grandfather   . Alcohol abuse Maternal Grandfather   . Diabetes Paternal Grandmother   .  Heart attack Paternal Grandmother   . Emphysema Paternal Grandfather   . Diabetes Paternal Grandfather   . Heart disease Paternal Grandfather   . Thyroid disease Sister   . Vitamin D deficiency Sister   . Arthritis Sister   . COPD Sister   . Fibromyalgia Sister   . Hypertension Sister   . Hyperlipidemia Sister   . Asthma Sister   . Thyroid disease Sister   . Breast cancer Other   . Cancer Neg Hx    Social History   Socioeconomic History  . Marital status: Married    Spouse name: Not on  file  . Number of children: 3  . Years of education: Not on file  . Highest education level: High school graduate  Occupational History  . Not on file  Tobacco Use  . Smoking status: Never Smoker  . Smokeless tobacco: Never Used  Substance and Sexual Activity  . Alcohol use: No  . Drug use: No  . Sexual activity: Not Currently  Other Topics Concern  . Not on file  Social History Narrative  . Not on file   Social Determinants of Health   Financial Resource Strain:   . Difficulty of Paying Living Expenses: Not on file  Food Insecurity:   . Worried About Charity fundraiser in the Last Year: Not on file  . Ran Out of Food in the Last Year: Not on file  Transportation Needs:   . Lack of Transportation (Medical): Not on file  . Lack of Transportation (Non-Medical): Not on file  Physical Activity:   . Days of Exercise per Week: Not on file  . Minutes of Exercise per Session: Not on file  Stress:   . Feeling of Stress : Not on file  Social Connections:   . Frequency of Communication with Friends and Family: Not on file  . Frequency of Social Gatherings with Friends and Family: Not on file  . Attends Religious Services: Not on file  . Active Member of Clubs or Organizations: Not on file  . Attends Archivist Meetings: Not on file  . Marital Status: Not on file    Outpatient Encounter Medications as of 05/04/2019  Medication Sig  . acetaminophen (TYLENOL 8 HOUR) 650 MG CR tablet Take 650 mg by mouth every 8 (eight) hours as needed for pain.  Marland Kitchen aspirin 81 MG EC tablet Take 81 mg by mouth daily.   . Calcium Carbonate-Vit D-Min (CALCIUM 1200 PO) Take 600 mg by mouth 2 (two) times daily.  Rolena Infante Sagrada 450 MG CAPS Take 450 mg by mouth daily as needed.   . COD LIVER OIL/VITAMINS A & D PO Take 1 tablet by mouth daily.  . Cyanocobalamin (VITAMIN B-12) 1000 MCG SUBL Place 1,000 mcg under the tongue once a week.   . fluticasone (FLONASE) 50 MCG/ACT nasal spray Place 2  sprays into both nostrils daily.  . Homeopathic Products ([employer] LEG CRAMPS PAIN RELIEF PO) Take 1 tablet by mouth daily as needed (leg cramps).   Marland Kitchen levocetirizine (XYZAL) 5 MG tablet Take 1 tablet (5 mg total) by mouth every evening.  . magnesium oxide (MAG-OX) 400 MG tablet Take 400 mg by mouth daily.  Marland Kitchen OVER THE COUNTER MEDICATION Take 1,000 mg by mouth daily. **Black Cumin Seed Oil**  . OVER THE COUNTER MEDICATION Take 1 capsule by mouth as directed. [place]   . OVER THE COUNTER MEDICATION Take 1 Product by mouth as directed. CholestOff Plus   .  OVER THE COUNTER MEDICATION Take 1 Dose by mouth as directed. Sugar Metabolism Support  (Cinnamon 2000mg  plus Chromium)   . pantoprazole (PROTONIX) 20 MG tablet Take 1 tablet (20 mg total) by mouth every morning. One hour before breakfast  . Potassium Gluconate 2.5 MEQ TABS Take 1 tablet by mouth daily.  . pseudoephedrine (SUDAFED) 30 MG tablet Take 30 mg by mouth daily.  . rosuvastatin (CRESTOR) 20 MG tablet Take 1 tablet (20 mg total) by mouth at bedtime.  . TURMERIC CURCUMIN PO Take 1 capsule by mouth 3 (three) times a week.  . [DISCONTINUED] B Complex-C (SUPER B COMPLEX PO) Take 1 tablet by mouth daily.  . [DISCONTINUED] meclizine (ANTIVERT) 25 MG tablet Take 1 tablet (25 mg total) by mouth 3 (three) times daily as needed for dizziness.   No facility-administered encounter medications on file as of 05/04/2019.    Activities of Daily Living In your present state of health, do you have any difficulty performing the following activities: 05/04/2019 03/03/2019  Hearing? N N  Comment declines hearing aids -  Vision? Y Y  Difficulty concentrating or making decisions? N N  Walking or climbing stairs? N N  Dressing or bathing? N N  Doing errands, shopping? N N  Preparing Food and eating ? N -  Using the Toilet? N -  In the past six months, have you accidently leaked urine? Y -  Comment wears pads for protection -  Do  you have problems with loss of bowel control? N -  Managing your Medications? N -  Managing your Finances? N -  Housekeeping or managing your Housekeeping? N -  Some recent data might be hidden    Patient Care Team: Delsa Grana, PA-C as PCP - General (Family Medicine)    Assessment:   This is a routine wellness examination for Ebony [name].  Exercise Activities and Dietary recommendations Current Exercise Habits: The patient does not participate in regular exercise at present, Exercise limited by: orthopedic condition(s)(arthritis)  Goals    . DIET - INCREASE WATER INTAKE     Recommend water intake of 6-8 glasses of water.     . Reduce sugar intake (pt-stated)     She will try to reduce sugar intake       Fall Risk Fall Risk  05/04/2019 03/03/2019 12/30/2018 07/01/2018 04/24/2018  Falls in the past year? 0 0 0 0 0  Number falls in past yr: 0 0 0 0 0  Injury with Fall? 0 0 0 0 -  Risk for fall due to : No Fall Risks - - - -  Follow up Falls prevention discussed - Falls evaluation completed - -   FALL RISK PREVENTION PERTAINING TO THE HOME:  Any stairs in or around the home? Yes  If so, do they handrails? Yes   Home free of loose throw rugs in walkways, pet beds, electrical cords, etc? Yes  Adequate lighting in your home to reduce risk of falls? Yes   ASSISTIVE DEVICES UTILIZED TO PREVENT FALLS:  Life alert? No  Use of a cane, walker or w/c? No  Grab bars in the bathroom? Yes  Shower chair or bench in shower? No  Elevated toilet seat or a handicapped toilet? No   DME ORDERS:  DME order needed?  No   TIMED UP AND GO:  Was the test performed? No . Telephonic visit.   Education: Fall risk prevention has been discussed.  Intervention(s) required? No   Depression Screen PHQ 2/9 Scores  05/04/2019 03/03/2019 12/30/2018 07/01/2018  PHQ - 2 Score 0 0 0 0  PHQ- 9 Score - 0 0 0     Cognitive Function     6CIT Screen 05/04/2019 04/24/2018 02/04/2017  What Year? 0 points  0 points 0 points  What month? 0 points 0 points 0 points  What time? 0 points 0 points 0 points  Count back from 20 0 points 0 points 0 points  Months in reverse 0 points 0 points 0 points  Repeat phrase 2 points 2 points 2 points  Total Score 2 2 2      There is no immunization history on file for this patient.  Qualifies for Shingles Vaccine? Yes  . Due for Shingrix. Education has been provided regarding the importance of this vaccine. Pt has been advised to call insurance company to determine out of pocket expense. Advised may also receive vaccine at local pharmacy or Health Dept. Verbalized acceptance and understanding.  Tdap: Although this vaccine is not a covered service during a Wellness Exam, does the patient still wish to receive this vaccine today?  No .  Education has been provided regarding the importance of this vaccine. Advised may receive this vaccine at local pharmacy or Health Dept. Aware to provide a copy of the vaccination record if obtained from local pharmacy or Health Dept. Verbalized acceptance and understanding.  Flu Vaccine: Due for Flu vaccine. Does the patient want to receive this vaccine today?  No . Education has been provided regarding the importance of this vaccine but still declined. Advised may receive this vaccine at local pharmacy or Health Dept. Aware to provide a copy of the vaccination record if obtained from local pharmacy or Health Dept. Verbalized acceptance and understanding.  Pneumococcal Vaccine: Due for Pneumococcal vaccine. Does the patient want to receive this vaccine today?  No . Education has been provided regarding the importance of this vaccine but still declined. Advised may receive this vaccine at local pharmacy or Health Dept. Aware to provide a copy of the vaccination record if obtained from local pharmacy or Health Dept. Verbalized acceptance and understanding.   Screening Tests Health Maintenance  Topic Date Due  . MAMMOGRAM   08/05/2018  . TETANUS/TDAP  07/02/2019 (Originally 09/18/1969)  . PNA vac Low Risk Adult (1 of 2 - PCV13) 07/02/2019 (Originally 09/19/2014)  . INFLUENZA VACCINE  08/04/2019 (Originally 12/05/2018)  . COLONOSCOPY  05/10/2027  . DEXA SCAN  Completed  . Hepatitis C Screening  Completed    Cancer Screenings:  Colorectal Screening: Completed 05/09/17. Repeat every 10 years;   Mammogram: Completed 08/04/17. Repeat every year. Ordered today. Pt provided with contact information and advised to call to schedule appt.   Bone Density: Completed 2007. Results reflect unavailable. Repeat every 2 years. Ordered today. Pt provided with contact information and advised to call to schedule appt.   Lung Cancer Screening: (Low Dose CT Chest recommended if Age 55-80 years, 30 pack-year currently smoking OR have quit w/in 15years.) does not qualify.    Additional Screening:  Hepatitis C Screening: does qualify; Completed 07/25/15  Vision Screening: Recommended annual ophthalmology exams for early detection of glaucoma and other disorders of the eye. Is the patient up to date with their annual eye exam?  Yes  Who is the provider or what is the name of the office in which the pt attends annual eye exams? Dr. Thomasene Ripple  Dental Screening: Recommended annual dental exams for proper oral hygiene  Community Resource Referral:  CRR required  this visit?  No      Plan:     I have personally reviewed and addressed the Medicare Annual Wellness questionnaire and have noted the following in the patient's chart:  A. Medical and social history B. Use of alcohol, tobacco or illicit drugs  C. Current medications and supplements D. Functional ability and status E.  Nutritional status F.  Physical activity G. Advance directives H. List of other physicians I.  Hospitalizations, surgeries, and ER visits in previous 12 months J.  [place] such as hearing and vision if needed, cognitive and  depression L. Referrals and appointments   In addition, I have reviewed and discussed with patient certain preventive protocols, quality metrics, and best practice recommendations. A written personalized care plan for preventive services as well as general preventive health recommendations were provided to patient.   Signed,  Clemetine Marker, LPN Nurse Health Advisor   Nurse Notes: pt c/o arthritis pain in hands and knees, taking 2 tylenol 650mg  BID with some relief but inquired about any other prescription medications. Advised to discuss with provider due to hx of allergy to celebrex.

## 2019-05-04 NOTE — Patient Instructions (Signed)
Ebony [name] , Thank you for taking time to come for your Medicare Wellness Visit. I appreciate your ongoing commitment to your health goals. Please review the following plan we discussed and let me know if I can assist you in the future.   Screening recommendations/referrals: Colonoscopy: done 05/09/17.  Mammogram: done 08/04/17. Please call [phone] to schedule your mammogram and bone density screening.  Recommended yearly ophthalmology/optometry visit for glaucoma screening and checkup Recommended yearly dental visit for hygiene and checkup  Vaccinations: Influenza vaccine: postponed Pneumococcal vaccine: postponed Tdap vaccine: postponed Shingles vaccine: Shingrix discussed. Please contact your pharmacy for coverage information.   Advanced directives: Advance directive discussed with you today. I have provided a copy for you to complete at home and have notarized. Once this is complete please bring a copy in to our office so we can scan it into your chart.  Conditions/risks identified: Keep up the great work!  Next appointment: Please follow up in one year for your Medicare Annual Wellness visit.     Preventive Care 26 Years and Older, Female Preventive care refers to lifestyle choices and visits with your health care provider that can promote health and wellness. What does preventive care include?  A yearly physical exam. This is also called an annual well check.  Dental exams once or twice a year.  Routine eye exams. Ask your health care provider how often you should have your eyes checked.  Personal lifestyle choices, including:  Daily care of your teeth and gums.  Regular physical activity.  Eating a healthy diet.  Avoiding tobacco and drug use.  Limiting alcohol use.  Practicing safe sex.  Taking low-dose aspirin every day.  Taking vitamin and mineral supplements as recommended by your health care provider. What happens during an annual well check? The  services and screenings done by your health care provider during your annual well check will depend on your age, overall health, lifestyle risk factors, and family history of disease. Counseling  Your health care provider may ask you questions about your:  Alcohol use.  Tobacco use.  Drug use.  Emotional well-being.  Home and relationship well-being.  Sexual activity.  Eating habits.  History of falls.  Memory and ability to understand (cognition).  Work and work Statistician.  Reproductive health. Screening  You may have the following tests or measurements:  Height, weight, and BMI.  Blood pressure.  Lipid and cholesterol levels. These may be checked every 5 years, or more frequently if you are over 56 years old.  Skin check.  Lung cancer screening. You may have this screening every year starting at age 39 if you have a 30-pack-year history of smoking and currently smoke or have quit within the past 15 years.  Fecal occult blood test (FOBT) of the stool. You may have this test every year starting at age 24.  Flexible sigmoidoscopy or colonoscopy. You may have a sigmoidoscopy every 5 years or a colonoscopy every 10 years starting at age 23.  Hepatitis C blood test.  Hepatitis B blood test.  Sexually transmitted disease (STD) testing.  Diabetes screening. This is done by checking your blood sugar (glucose) after you have not eaten for a while (fasting). You may have this done every 1-3 years.  Bone density scan. This is done to screen for osteoporosis. You may have this done starting at age 62.  Mammogram. This may be done every 1-2 years. Talk to your health care provider about how often you should have regular mammograms.  Talk with your health care provider about your test results, treatment options, and if necessary, the need for more tests. Vaccines  Your health care provider may recommend certain vaccines, such as:  Influenza vaccine. This is recommended  every year.  Tetanus, diphtheria, and acellular pertussis (Tdap, Td) vaccine. You may need a Td booster every 10 years.  Zoster vaccine. You may need this after age 30.  Pneumococcal 13-valent conjugate (PCV13) vaccine. One dose is recommended after age 15.  Pneumococcal polysaccharide (PPSV23) vaccine. One dose is recommended after age 55. Talk to your health care provider about which screenings and vaccines you need and how often you need them. This information is not intended to replace advice given to you by your health care provider. Make sure you discuss any questions you have with your health care provider. Document Released: 05/19/2015 Document Revised: 01/10/2016 Document Reviewed: 02/21/2015 Elsevier Interactive Patient Education  2017 [place] Prevention in the Home Falls can cause injuries. They can happen to people of all ages. There are many things you can do to make your home safe and to help prevent falls. What can I do on the outside of my home?  Regularly fix the edges of walkways and driveways and fix any cracks.  Remove anything that might make you trip as you walk through a door, such as a raised step or threshold.  Trim any bushes or trees on the path to your home.  Use bright outdoor lighting.  Clear any walking paths of anything that might make someone trip, such as rocks or tools.  Regularly check to see if handrails are loose or broken. Make sure that both sides of any steps have handrails.  Any raised decks and porches should have guardrails on the edges.  Have any leaves, snow, or ice cleared regularly.  Use sand or salt on walking paths during winter.  Clean up any spills in your garage right away. This includes oil or grease spills. What can I do in the bathroom?  Use night lights.  Install grab bars by the toilet and in the tub and shower. Do not use towel bars as grab bars.  Use non-skid mats or decals in the tub or shower.  If  you need to sit down in the shower, use a plastic, non-slip stool.  Keep the floor dry. Clean up any water that spills on the floor as soon as it happens.  Remove soap buildup in the tub or shower regularly.  Attach bath mats securely with double-sided non-slip rug tape.  Do not have throw rugs and other things on the floor that can make you trip. What can I do in the bedroom?  Use night lights.  Make sure that you have a light by your bed that is easy to reach.  Do not use any sheets or blankets that are too big for your bed. They should not hang down onto the floor.  Have a firm chair that has side arms. You can use this for support while you get dressed.  Do not have throw rugs and other things on the floor that can make you trip. What can I do in the kitchen?  Clean up any spills right away.  Avoid walking on wet floors.  Keep items that you use a lot in easy-to-reach places.  If you need to reach something above you, use a strong step stool that has a grab bar.  Keep electrical cords out of the way.  Do not use floor polish or wax that makes floors slippery. If you must use wax, use non-skid floor wax.  Do not have throw rugs and other things on the floor that can make you trip. What can I do with my stairs?  Do not leave any items on the stairs.  Make sure that there are handrails on both sides of the stairs and use them. Fix handrails that are broken or loose. Make sure that handrails are as long as the stairways.  Check any carpeting to make sure that it is firmly attached to the stairs. Fix any carpet that is loose or worn.  Avoid having throw rugs at the top or bottom of the stairs. If you do have throw rugs, attach them to the floor with carpet tape.  Make sure that you have a light switch at the top of the stairs and the bottom of the stairs. If you do not have them, ask someone to add them for you. What else can I do to help prevent falls?  Wear shoes  that:  Do not have high heels.  Have rubber bottoms.  Are comfortable and fit you well.  Are closed at the toe. Do not wear sandals.  If you use a stepladder:  Make sure that it is fully opened. Do not climb a closed stepladder.  Make sure that both sides of the stepladder are locked into place.  Ask someone to hold it for you, if possible.  Clearly mark and make sure that you can see:  Any grab bars or handrails.  First and last steps.  Where the edge of each step is.  Use tools that help you move around (mobility aids) if they are needed. These include:  Canes.  Walkers.  Scooters.  Crutches.  Turn on the lights when you go into a dark area. Replace any light bulbs as soon as they burn out.  Set up your furniture so you have a clear path. Avoid moving your furniture around.  If any of your floors are uneven, fix them.  If there are any pets around you, be aware of where they are.  Review your medicines with your doctor. Some medicines can make you feel dizzy. This can increase your chance of falling. Ask your doctor what other things that you can do to help prevent falls. This information is not intended to replace advice given to you by your health care provider. Make sure you discuss any questions you have with your health care provider. Document Released: 02/16/2009 Document Revised: 09/28/2015 Document Reviewed: 05/27/2014 Elsevier Interactive Patient Education  2017 [employer].

## 2019-05-05 ENCOUNTER — Other Ambulatory Visit: Payer: Self-pay | Admitting: Family Medicine

## 2019-05-05 DIAGNOSIS — J324 Chronic pansinusitis: Secondary | ICD-10-CM

## 2019-05-05 DIAGNOSIS — H6983 Other specified disorders of Eustachian tube, bilateral: Secondary | ICD-10-CM

## 2019-05-17 DIAGNOSIS — H16223 Keratoconjunctivitis sicca, not specified as Sjogren's, bilateral: Secondary | ICD-10-CM | POA: Diagnosis not present

## 2019-05-21 ENCOUNTER — Other Ambulatory Visit: Payer: Self-pay | Admitting: Family Medicine

## 2019-05-21 DIAGNOSIS — E782 Mixed hyperlipidemia: Secondary | ICD-10-CM

## 2019-05-28 ENCOUNTER — Other Ambulatory Visit: Payer: Self-pay | Admitting: Family Medicine

## 2019-05-28 DIAGNOSIS — J324 Chronic pansinusitis: Secondary | ICD-10-CM

## 2019-05-28 DIAGNOSIS — H6983 Other specified disorders of Eustachian tube, bilateral: Secondary | ICD-10-CM

## 2019-06-04 ENCOUNTER — Ambulatory Visit: Payer: Medicare HMO | Admitting: Family Medicine

## 2019-06-05 ENCOUNTER — Other Ambulatory Visit: Payer: Self-pay | Admitting: Family Medicine

## 2019-06-05 DIAGNOSIS — K219 Gastro-esophageal reflux disease without esophagitis: Secondary | ICD-10-CM

## 2019-06-14 ENCOUNTER — Other Ambulatory Visit: Payer: Self-pay

## 2019-06-14 ENCOUNTER — Encounter: Payer: Self-pay | Admitting: Family Medicine

## 2019-06-14 ENCOUNTER — Ambulatory Visit (INDEPENDENT_AMBULATORY_CARE_PROVIDER_SITE_OTHER): Payer: Medicare HMO | Admitting: Family Medicine

## 2019-06-14 VITALS — BP 120/79 | HR 87 | Ht 67.5 in | Wt 145.0 lb

## 2019-06-14 DIAGNOSIS — K219 Gastro-esophageal reflux disease without esophagitis: Secondary | ICD-10-CM

## 2019-06-14 DIAGNOSIS — R7303 Prediabetes: Secondary | ICD-10-CM | POA: Diagnosis not present

## 2019-06-14 DIAGNOSIS — M170 Bilateral primary osteoarthritis of knee: Secondary | ICD-10-CM | POA: Diagnosis not present

## 2019-06-14 DIAGNOSIS — E782 Mixed hyperlipidemia: Secondary | ICD-10-CM | POA: Diagnosis not present

## 2019-06-14 DIAGNOSIS — E538 Deficiency of other specified B group vitamins: Secondary | ICD-10-CM | POA: Diagnosis not present

## 2019-06-14 NOTE — Progress Notes (Signed)
Name: Ebony [name]   MRN: [id number]    DOB: [birth year]   Date:06/14/2019       Progress Note  Subjective:    Chief Complaint  Chief Complaint  Patient presents with  . Follow-up  . Hyperlipidemia    Pt states has already ran out of chol med?  according to [place] she should not be out?  . Gastroesophageal Reflux    I connected with  [name] on 06/14/19 at 10:40 AM EST by telephone and verified that I am speaking with the correct person using two identifiers.   I discussed the limitations, risks, security and privacy concerns of performing an evaluation and management service by telephone and the availability of in person appointments. Staff also discussed with the patient that there may be a patient responsible charge related to this service. Patient Location: home Provider Location: [facility]  Additional Individuals present: none  HPI  GERD: - Current medication regimen:  protonix 20 mg daily - Possible Triggers: eating foods fast, red sauces/spaghetti - Endorses: well controlled when she takes her meds - Denies: abdominal pain, chest pain, cough, weight loss, dysphagia, black stools, hematemesis, diarrhea or constipation  Palpitations usually occur late at night for a few seconds, she believes its from stress.  No associated SOB, near syncope, LE edema   Hyperlipidemia:  Current Medication Regimen:  crestor 20 mg  Last Lipids: Lab Results  Component Value Date   CHOL 226 (H) 02/26/2019   HDL 87 02/26/2019   LDLCALC 126 (H) 02/26/2019   TRIG 63 02/26/2019   CHOLHDL 2.6 02/26/2019  Patient thought she ran out of her medication she has not been taking consistently, but then she did find a bottle recently and states that she has plenty and she will start taking in the evening - Current Diet:  Healthy - Denies: Chest pain, shortness of breath, myalgias. - Documented aortic atherosclerosis? No - Risk factors for atherosclerosis: hypercholesterolemia and  hypertension The 10-year ASCVD risk score Mikey Bussing DC Jr., et al., 2013) is: 10.8%   Values used to calculate the score:     Age: 69 years     Sex: Female     Is Non-Hispanic African American: Yes     Diabetic: No     Tobacco smoker: No     Systolic Blood Pressure: 123456 mmHg     Is BP treated: No     HDL Cholesterol: 87 mg/dL     Total Cholesterol: 226 mg/dL  Patient also complains of bilateral chronic knee pain secondary to osteoarthritis.  She recently with her husband to her orthopedic surgeon and she would like to also be referred to them to have her knee pain evaluated.  Patient is due for follow-up on prediabetes also has B12 deficiency she would like to, recheck that at her next visit she is mostly doing over-the-counter supplements herbal supplements and has tried to generally be very healthy.  Since her most recent ER visit and office visit she has not had any recurrence of any chest pain she does feel that it may have been secondary to GERD which is now well controlled   Patient Active Problem List   Diagnosis Date Noted  . Chest pain 02/25/2019  . B12 deficiency 12/30/2018  . Hyperlipidemia 07/01/2018  . Prediabetes 07/01/2018  . Adjustment disorder with mixed anxiety and depressed mood 10/11/2015  . Heartburn 10/11/2015  . Arthritis of knee, degenerative 07/18/2015    Social History   Tobacco Use  .  Smoking status: Never Smoker  . Smokeless tobacco: Never Used  Substance Use Topics  . Alcohol use: No     Current Outpatient Medications:  .  acetaminophen (TYLENOL 8 HOUR) 650 MG CR tablet, Take 650 mg by mouth every 8 (eight) hours as needed for pain., Disp: , Rfl:  .  aspirin 81 MG EC tablet, Take 81 mg by mouth daily. , Disp: , Rfl:  .  Calcium Carbonate-Vit D-Min (CALCIUM 1200 PO), Take 600 mg by mouth 2 (two) times daily., Disp: , Rfl:  .  C[place] 450 MG CAPS, Take 450 mg by mouth daily as needed. , Disp: , Rfl:  .  COD LIVER OIL/VITAMINS A & D PO, Take  1 tablet by mouth daily., Disp: , Rfl:  .  Cyanocobalamin (VITAMIN B-12) 1000 MCG SUBL, Place 1,000 mcg under the tongue once a week. , Disp: , Rfl:  .  fluticasone (FLONASE) 50 MCG/ACT nasal spray, Place 2 sprays into both nostrils daily., Disp: 16 g, Rfl: 3 .  Homeopathic Products ([employer] LEG CRAMPS PAIN RELIEF PO), Take 1 tablet by mouth daily as needed (leg cramps). , Disp: , Rfl:  .  levocetirizine (XYZAL) 5 MG tablet, TAKE 1 TABLET BY MOUTH EVERY DAY IN THE EVENING, Disp: 90 tablet, Rfl: 0 .  magnesium oxide (MAG-OX) 400 MG tablet, Take 400 mg by mouth daily., Disp: , Rfl:  .  OVER THE COUNTER MEDICATION, Take 1,000 mg by mouth daily. **Black Cumin Seed Oil**, Disp: , Rfl:  .  OVER THE COUNTER MEDICATION, Take 1 capsule by mouth as directed. VisionShield Eye Health supplement , Disp: , Rfl:  .  OVER THE COUNTER MEDICATION, Take 1 Product by mouth as directed. CholestOff Plus , Disp: , Rfl:  .  OVER THE COUNTER MEDICATION, Take 1 Dose by mouth as directed. Sugar Metabolism Support  (Cinnamon 2000mg  plus Chromium) , Disp: , Rfl:  .  pantoprazole (PROTONIX) 20 MG tablet, TAKE 1 TABLET (20 MG TOTAL) BY MOUTH EVERY MORNING. ONE HOUR BEFORE BREAKFAST, Disp: 30 tablet, Rfl: 1 .  Potassium Gluconate 2.5 MEQ TABS, Take 1 tablet by mouth daily., Disp: , Rfl:  .  pseudoephedrine (SUDAFED) 30 MG tablet, Take 30 mg by mouth daily., Disp: , Rfl:  .  rosuvastatin (CRESTOR) 20 MG tablet, TAKE 1 TABLET BY MOUTH EVERYDAY AT BEDTIME, Disp: 30 tablet, Rfl: 0 .  TURMERIC CURCUMIN PO, Take 1 capsule by mouth 3 (three) times a week., Disp: , Rfl:   Allergies  Allergen Reactions  . Celecoxib Anxiety and Itching    Chart Review: I personally reviewed active problem list, medication list, allergies, family history, social history, health maintenance, notes from last encounter, lab results, imaging with the patient/caregiver today.  Review of Systems  Constitutional: Negative.   HENT: Negative.   Eyes: Negative.    Respiratory: Negative.   Cardiovascular: Negative.   Gastrointestinal: Negative.   Endocrine: Negative.   Genitourinary: Negative.   Musculoskeletal: Negative.   Skin: Negative.   Allergic/Immunologic: Negative.   Neurological: Negative.   Hematological: Negative.   Psychiatric/Behavioral: Negative.   All other systems reviewed and are negative.    Objective:    Virtual encounter, vitals limited, only able to obtain the following Today's Vitals   06/14/19 1011  BP: 120/79  Pulse: 87  Weight: 145 lb (65.8 kg)  Height: 5' 7.5" (1.715 m)   Body mass index is 22.38 kg/m. Nursing Note and Vital Signs reviewed.  Physical Exam Vitals and nursing note  reviewed.  Neurological:     Mental Status: She is alert.     PE limited by telephone encounter  No results found for this or any previous visit (from the past 72 hour(s)).  Assessment and Plan:   1. Mixed hyperlipidemia Has not been taking new crestor med, she thought she ran out, then found a full bottle. Will have her come in in 3 months to recheck labs, would not be improved now with poor compliance.  No SE or concerns Reviewed ASCVD risk >10%  2. Gastroesophageal reflux disease, unspecified whether esophagitis present Well controlled with daily protonix  3. Prediabetes Will recheck at next visit, she is working on diet and healthy lifestyle  4. B12 deficiency Recheck levels at next visit  5. Osteoarthritis of both knees, unspecified osteoarthritis type She requests ortho eval for chronic knee pain, her husband went to Dr. Sabra Heck and got an injection, she would like to see him too.   -Red flags and when to present for emergency care or RTC including but not limited to new/worsening/un-resolving symptoms,  reviewed with patient at time of visit. Follow up and care instructions discussed and provided in AVS. - I discussed the assessment and treatment plan with the patient. The patient was provided an opportunity  to ask questions and all were answered. The patient agreed with the plan and demonstrated an understanding of the instructions.  - The patient was advised to call back or seek an in-person evaluation if the symptoms worsen or if the condition fails to improve as anticipated.  I provided 15 minutes of non-face-to-face time during this encounter.  Delsa Grana, PA-C 06/14/19 11:10 AM

## 2019-06-16 ENCOUNTER — Telehealth: Payer: Self-pay | Admitting: Family Medicine

## 2019-06-16 ENCOUNTER — Encounter: Payer: Self-pay | Admitting: Family Medicine

## 2019-06-16 DIAGNOSIS — E782 Mixed hyperlipidemia: Secondary | ICD-10-CM

## 2019-06-18 DIAGNOSIS — M170 Bilateral primary osteoarthritis of knee: Secondary | ICD-10-CM | POA: Diagnosis not present

## 2019-07-02 ENCOUNTER — Ambulatory Visit: Payer: Medicare HMO | Admitting: Family Medicine

## 2019-07-16 DIAGNOSIS — Z0100 Encounter for examination of eyes and vision without abnormal findings: Secondary | ICD-10-CM | POA: Diagnosis not present

## 2019-07-19 DIAGNOSIS — H16223 Keratoconjunctivitis sicca, not specified as Sjogren's, bilateral: Secondary | ICD-10-CM | POA: Diagnosis not present

## 2019-08-21 ENCOUNTER — Other Ambulatory Visit: Payer: Self-pay | Admitting: Family Medicine

## 2019-08-21 DIAGNOSIS — K219 Gastro-esophageal reflux disease without esophagitis: Secondary | ICD-10-CM

## 2019-08-31 ENCOUNTER — Other Ambulatory Visit: Payer: Self-pay | Admitting: Family Medicine

## 2019-08-31 DIAGNOSIS — H6983 Other specified disorders of Eustachian tube, bilateral: Secondary | ICD-10-CM

## 2019-08-31 DIAGNOSIS — J324 Chronic pansinusitis: Secondary | ICD-10-CM

## 2019-09-11 ENCOUNTER — Other Ambulatory Visit: Payer: Self-pay | Admitting: Family Medicine

## 2019-09-11 DIAGNOSIS — E782 Mixed hyperlipidemia: Secondary | ICD-10-CM

## 2019-09-15 ENCOUNTER — Ambulatory Visit: Payer: Medicare HMO | Admitting: Family Medicine

## 2019-09-15 ENCOUNTER — Other Ambulatory Visit: Payer: Self-pay

## 2019-09-15 ENCOUNTER — Encounter: Payer: Self-pay | Admitting: Family Medicine

## 2019-09-15 VITALS — BP 122/82 | HR 76 | Temp 97.5°F | Resp 14 | Ht 68.0 in | Wt 147.1 lb

## 2019-09-15 DIAGNOSIS — D709 Neutropenia, unspecified: Secondary | ICD-10-CM

## 2019-09-15 DIAGNOSIS — R7303 Prediabetes: Secondary | ICD-10-CM

## 2019-09-15 DIAGNOSIS — Z5181 Encounter for therapeutic drug level monitoring: Secondary | ICD-10-CM | POA: Diagnosis not present

## 2019-09-15 DIAGNOSIS — K219 Gastro-esophageal reflux disease without esophagitis: Secondary | ICD-10-CM | POA: Diagnosis not present

## 2019-09-15 DIAGNOSIS — H6983 Other specified disorders of Eustachian tube, bilateral: Secondary | ICD-10-CM

## 2019-09-15 DIAGNOSIS — J324 Chronic pansinusitis: Secondary | ICD-10-CM | POA: Diagnosis not present

## 2019-09-15 DIAGNOSIS — E782 Mixed hyperlipidemia: Secondary | ICD-10-CM | POA: Diagnosis not present

## 2019-09-15 MED ORDER — PANTOPRAZOLE SODIUM 40 MG PO TBEC: 40.0000 mg | DELAYED_RELEASE_TABLET | Freq: Every day | ORAL | 3 refills | Status: DC

## 2019-09-15 MED ORDER — FLUTICASONE PROPIONATE 50 MCG/ACT NA SUSP: 2.0000 | Freq: Every day | NASAL | 3 refills | Status: DC

## 2019-09-15 NOTE — Progress Notes (Signed)
Name: Ebony [name]   MRN: [id number]    DOB: 29-Jul-1950   Date:09/15/2019       Progress Note  Chief Complaint  Patient presents with  . Follow-up     Subjective:   Ebony [name] is a 69 y.o. female, presents to clinic for routine follow up on the conditions listed above.  GI upset/CP still symptomatic taking protonix every day- 20 mg, hurts usually after eating or if she eats too fast, indigestion, bloating, reflux and burning, she denies melena, hematochezia  Hyperlipidemia: Current Medication Regimen:   Compliant with crestor 20 mg at bedtime, no SE or concerns Last Lipids: Lab Results  Component Value Date   CHOL 226 (H) 02/26/2019   HDL 87 02/26/2019   LDLCALC 126 (H) 02/26/2019   TRIG 63 02/26/2019   CHOLHDL 2.6 02/26/2019  - Current Diet:  healthy - Denies: Chest pain, shortness of breath, myalgias. - Documented aortic atherosclerosis? No - Risk factors for atherosclerosis: hypercholesterolemia   GERD - still sx, on protonix 20, sx with every time she eats taking most day in am   Sees cardiology Nehemiah Massed at [facility], saw after hospitalization in Oct 2020, has f/up appt with cardiology in 2 weeks    Patient Active Problem List   Diagnosis Date Noted  . Chest pain 02/25/2019  . B12 deficiency 12/30/2018  . Hyperlipidemia 07/01/2018  . Prediabetes 07/01/2018  . Adjustment disorder with mixed anxiety and depressed mood 10/11/2015  . Heartburn 10/11/2015  . Arthritis of knee, degenerative 07/18/2015    Past Surgical History:  Procedure Laterality Date  . ABDOMINAL HYSTERECTOMY     complete  . COLONOSCOPY WITH PROPOFOL N/A 05/09/2017   Procedure: COLONOSCOPY WITH PROPOFOL;  Surgeon: Lucilla Lame, MD;  Location: [facility];  Service: Endoscopy;  Laterality: N/A;  . EYE SURGERY  08/04/2017  . KNEE SURGERY Bilateral     Family History  Problem Relation Age of Onset  . Hypertension Mother   . Stroke Mother        4 mini strokes  . Thyroid  disease Mother   . Kidney failure Father   . Diabetes Father   . Hypertension Father   . Kidney disease Father   . Emphysema Maternal Grandmother   . Diabetes Maternal Grandmother   . Hypertension Maternal Grandmother   . Diabetes Maternal Grandfather   . Alcohol abuse Maternal Grandfather   . Diabetes Paternal Grandmother   . Heart attack Paternal Grandmother   . Emphysema Paternal Grandfather   . Diabetes Paternal Grandfather   . Heart disease Paternal Grandfather   . Thyroid disease Sister   . Vitamin D deficiency Sister   . Arthritis Sister   . COPD Sister   . Fibromyalgia Sister   . Hypertension Sister   . Hyperlipidemia Sister   . Asthma Sister   . Thyroid disease Sister   . Breast cancer Other   . Cancer Neg Hx     Social History   Tobacco Use  . Smoking status: Never Smoker  . Smokeless tobacco: Never Used  Substance Use Topics  . Alcohol use: No  . Drug use: No      Current Outpatient Medications:  .  acetaminophen (TYLENOL 8 HOUR) 650 MG CR tablet, Take 650 mg by mouth every 8 (eight) hours as needed for pain., Disp: , Rfl:  .  aspirin 81 MG EC tablet, Take 81 mg by mouth daily. , Disp: , Rfl:  .  Calcium Carbonate-Vit D-Min (  CALCIUM 1200 PO), Take 600 mg by mouth 2 (two) times daily., Disp: , Rfl:  .  Cascara Sagrada 450 MG CAPS, Take 450 mg by mouth daily as needed. , Disp: , Rfl:  .  COD LIVER OIL/VITAMINS A & D PO, Take 1 tablet by mouth daily., Disp: , Rfl:  .  Cyanocobalamin (VITAMIN B-12) 1000 MCG SUBL, Place 1,000 mcg under the tongue once a week. , Disp: , Rfl:  .  fluticasone (FLONASE) 50 MCG/ACT nasal spray, Place 2 sprays into both nostrils daily., Disp: 16 g, Rfl: 3 .  Homeopathic Products ([employer]S LEG CRAMPS PAIN RELIEF PO), Take 1 tablet by mouth daily as needed (leg cramps). , Disp: , Rfl:  .  levocetirizine (XYZAL) 5 MG tablet, TAKE 1 TABLET BY MOUTH EVERY DAY IN THE EVENING, Disp: 90 tablet, Rfl: 0 .  magnesium oxide (MAG-OX) 400 MG tablet,  Take 400 mg by mouth daily., Disp: , Rfl:  .  OVER THE COUNTER MEDICATION, Take 1,000 mg by mouth daily. **Black Cumin Seed Oil**, Disp: , Rfl:  .  OVER THE COUNTER MEDICATION, Take 1 capsule by mouth as directed. VisionShield Eye Health supplement , Disp: , Rfl:  .  OVER THE COUNTER MEDICATION, Take 1 Dose by mouth as directed. Sugar Metabolism Support  (Cinnamon 2000mg  plus Chromium) , Disp: , Rfl:  .  pantoprazole (PROTONIX) 20 MG tablet, TAKE 1 TABLET (20 MG TOTAL) BY MOUTH EVERY MORNING. ONE HOUR BEFORE BREAKFAST, Disp: 30 tablet, Rfl: 1 .  Potassium Gluconate 2.5 MEQ TABS, Take 1 tablet by mouth daily., Disp: , Rfl:  .  pseudoephedrine (SUDAFED) 30 MG tablet, Take 30 mg by mouth daily., Disp: , Rfl:  .  rosuvastatin (CRESTOR) 20 MG tablet, TAKE 1 TABLET BY MOUTH EVERYDAY AT BEDTIME, Disp: 90 tablet, Rfl: 3 .  TURMERIC CURCUMIN PO, Take 1 capsule by mouth 3 (three) times a week., Disp: , Rfl:  .  erythromycin ophthalmic ointment, Apply small amount to left eye at bedtime, Disp: , Rfl:  .  REFRESH CELLUVISC 1 % GEL, , Disp: , Rfl:   Allergies  Allergen Reactions  . Celecoxib Anxiety and Itching    Chart Review Today: I personally reviewed active problem list, medication list, allergies, family history, social history, health maintenance, notes from last encounter, lab results, imaging with the patient/caregiver today.    Review of Systems  10 Systems reviewed and are negative for acute change except as noted in the HPI.  Objective:    Vitals:   09/15/19 0839  BP: 122/82  Pulse: 76  Resp: 14  Temp: (!) 97.5 F (36.4 C)  SpO2: 97%  Weight: 147 lb 1.6 oz (66.7 kg)  Height: 5\' 8"  (1.727 m)    Body mass index is 22.37 kg/m.  Physical Exam Vitals and nursing note reviewed.  Constitutional:      General: She is not in acute distress.    Appearance: She is well-developed. She is not ill-appearing, toxic-appearing or diaphoretic.     Interventions: Face mask in place.    HENT:     Head: Normocephalic and atraumatic.     Right Ear: External ear normal.     Left Ear: External ear normal.  Eyes:     General: Lids are normal. No scleral icterus.       Right eye: No discharge.        Left eye: No discharge.     Conjunctiva/sclera: Conjunctivae normal.  Neck:     Trachea: Phonation  normal. No tracheal deviation.  Cardiovascular:     Rate and Rhythm: Normal rate and regular rhythm.     Pulses: Normal pulses.          Radial pulses are 2+ on the right side and 2+ on the left side.       Posterior tibial pulses are 2+ on the right side and 2+ on the left side.     Heart sounds: Normal heart sounds. No murmur. No friction rub. No gallop.   Pulmonary:     Effort: Pulmonary effort is normal. No respiratory distress.     Breath sounds: Normal breath sounds. No stridor. No wheezing, rhonchi or rales.  Chest:     Chest wall: No tenderness.  Abdominal:     General: Bowel sounds are normal. There is no distension.     Palpations: Abdomen is soft.     Tenderness: There is no abdominal tenderness. There is no guarding or rebound.  Musculoskeletal:        General: No deformity.     Cervical back: Normal range of motion and neck supple.     Right lower leg: No edema.     Left lower leg: No edema.  Lymphadenopathy:     Cervical: No cervical adenopathy.  Skin:    General: Skin is warm and dry.     Capillary Refill: Capillary refill takes less than 2 seconds.     Coloration: Skin is not jaundiced or pale.     Findings: No rash.  Neurological:     Mental Status: She is alert.     Motor: No abnormal muscle tone.     Gait: Gait normal.  Psychiatric:        Mood and Affect: Mood normal.        Speech: Speech normal.        Behavior: Behavior normal.       PHQ2/9: Depression screen [facility] 2/9 09/15/2019 06/14/2019 05/04/2019 03/03/2019 12/30/2018  Decreased Interest 0 0 0 0 0  Down, Depressed, Hopeless 0 0 0 0 0  PHQ - 2 Score 0 0 0 0 0  Altered sleeping 0 0 - 0  0  Tired, decreased energy 0 0 - 0 0  Change in appetite 0 0 - 0 0  Feeling bad or failure about yourself  0 0 - 0 0  Trouble concentrating 0 0 - 0 0  Moving slowly or fidgety/restless 0 0 - 0 0  Suicidal thoughts 0 0 - 0 0  PHQ-9 Score 0 0 - 0 0  Difficult doing work/chores Not difficult at all Not difficult at all - Not difficult at all Not difficult at all    phq 9 is neg, reviewed  Fall Risk: Fall Risk  09/15/2019 06/14/2019 05/04/2019 03/03/2019 12/30/2018  Falls in the past year? 0 0 0 0 0  Number falls in past yr: 0 0 0 0 0  Injury with Fall? 0 0 0 0 0  Risk for fall due to : - - No Fall Risks - -  Follow up - - Falls prevention discussed - Falls evaluation completed    Functional Status Survey: Is the patient deaf or have difficulty hearing?: No Does the patient have difficulty seeing, even when wearing glasses/contacts?: No Does the patient have difficulty concentrating, remembering, or making decisions?: No Does the patient have difficulty walking or climbing stairs?: No Does the patient have difficulty dressing or bathing?: No Does the patient have difficulty doing errands alone such  as visiting a doctor's office or shopping?: No   Assessment & Plan:   1. Mixed hyperlipidemia Compliant with meds, no SE, no myalgias, fatigue or jaundice Due for FLP and recheck CMP Sees cardiology in 2 weeks - COMPLETE METABOLIC PANEL WITH GFR - Lipid panel  2. Eustachian tube dysfunction, bilateral Chronic nasal sx and recently ear sx, w/o signs of infection - take daily antihistamine and add flonase  - fluticasone (FLONASE) 50 MCG/ACT nasal spray; Place 2 sprays into both nostrils daily.  Dispense: 16 g; Refill: 3  3. Chronic pansinusitis Add flonase - fluticasone (FLONASE) 50 MCG/ACT nasal spray; Place 2 sprays into both nostrils daily.  Dispense: 16 g; Refill: 3  4. Neutropenia, unspecified type ([place]) Older dx on chart, reviewed labs, prior low WBC count, will recheck and  remove dx if continued normal WBC count - CBC with Differential/Platelet  5. Prediabetes Last A1C 6.0, family hx of DM on mother and father side - COMPLETE METABOLIC PANEL WITH GFR - Lipid panel - Hemoglobin A1c  6. Medication monitoring encounter - CBC with Differential/Platelet - COMPLETE METABOLIC PANEL WITH GFR - Lipid panel - Hemoglobin A1c  7. Gastroesophageal reflux disease, unspecified whether esophagitis present Still sx most days, increase protonix to 40 mg and take daily in the morning for the next 2-4 weeks consistently, can use additional pepcid or zantac PRN - possibly gastritis/gastric ulcer?  Tx more consistently and if not improving will refer to GI for eval. GERD info given to pt in hand out with instructions regarding meds and plan.  Pt was in agreement.   Return for 4 months routine f/up - come back in 1 month if reflux/GI symptoms are not improved.   Delsa Grana, PA-C 09/15/19 8:50 AM

## 2019-09-15 NOTE — Patient Instructions (Signed)
Increase protonix to 40 mg - take it every day for the next 2-3 weeks  Take pepcid or zantac in addition - if you are having symptoms still with the morning protonix  Let me know if symptoms are continuing in the next month - we will want you to see the GI doctor    Gastroesophageal Reflux Disease, Adult Gastroesophageal reflux (GER) happens when acid from the stomach flows up into the tube that connects the mouth and the stomach (esophagus). Normally, food travels down the esophagus and stays in the stomach to be digested. With GER, food and stomach acid sometimes move back up into the esophagus. You may have a disease called gastroesophageal reflux disease (GERD) if the reflux:  Happens often.  Causes frequent or very bad symptoms.  Causes problems such as damage to the esophagus. When this happens, the esophagus becomes sore and swollen (inflamed). Over time, GERD can make small holes (ulcers) in the lining of the esophagus. What are the causes? This condition is caused by a problem with the muscle between the esophagus and the stomach. When this muscle is weak or not normal, it does not close properly to keep food and acid from coming back up from the stomach. The muscle can be weak because of:  Tobacco use.  Pregnancy.  Having a certain type of hernia (hiatal hernia).  Alcohol use.  Certain foods and drinks, such as coffee, chocolate, onions, and peppermint. What increases the risk? You are more likely to develop this condition if you:  Are overweight.  Have a disease that affects your connective tissue.  Use NSAID medicines. What are the signs or symptoms? Symptoms of this condition include:  Heartburn.  Difficult or painful swallowing.  The feeling of having a lump in the throat.  A bitter taste in the mouth.  Bad breath.  Having a lot of saliva.  Having an upset or bloated stomach.  Belching.  Chest pain. Different conditions can cause chest pain. Make  sure you see your doctor if you have chest pain.  Shortness of breath or noisy breathing (wheezing).  Ongoing (chronic) cough or a cough at night.  Wearing away of the surface of teeth (tooth enamel).  Weight loss. How is this treated? Treatment will depend on how bad your symptoms are. Your doctor may suggest:  Changes to your diet.  Medicine.  Surgery. Follow these instructions at home: Eating and drinking   Follow a diet as told by your doctor. You may need to avoid foods and drinks such as: ? Coffee and tea (with or without caffeine). ? Drinks that contain alcohol. ? Energy drinks and sports drinks. ? Bubbly (carbonated) drinks or sodas. ? Chocolate and cocoa. ? Peppermint and mint flavorings. ? Garlic and onions. ? Horseradish. ? Spicy and acidic foods. These include peppers, chili powder, curry powder, vinegar, hot sauces, and BBQ sauce. ? Citrus fruit juices and citrus fruits, such as oranges, lemons, and limes. ? Tomato-based foods. These include red sauce, chili, salsa, and pizza with red sauce. ? Fried and fatty foods. These include donuts, french fries, potato chips, and high-fat dressings. ? High-fat meats. These include hot dogs, rib eye steak, sausage, ham, and bacon. ? High-fat dairy items, such as whole milk, butter, and cream cheese.  Eat small meals often. Avoid eating large meals.  Avoid drinking large amounts of liquid with your meals.  Avoid eating meals during the 2-3 hours before bedtime.  Avoid lying down right after you eat.  Do not exercise right after you eat. Lifestyle   Do not use any products that contain nicotine or tobacco. These include cigarettes, e-cigarettes, and chewing tobacco. If you need help quitting, ask your doctor.  Try to lower your stress. If you need help doing this, ask your doctor.  If you are overweight, lose an amount of weight that is healthy for you. Ask your doctor about a safe weight loss goal. General  instructions  Pay attention to any changes in your symptoms.  Take over-the-counter and prescription medicines only as told by your doctor. Do not take aspirin, ibuprofen, or other NSAIDs unless your doctor says it is okay.  Wear loose clothes. Do not wear anything tight around your waist.  Raise (elevate) the head of your bed about 6 inches (15 cm).  Avoid bending over if this makes your symptoms worse.  Keep all follow-up visits as told by your doctor. This is important. Contact a doctor if:  You have new symptoms.  You lose weight and you do not know why.  You have trouble swallowing or it hurts to swallow.  You have wheezing or a cough that keeps happening.  Your symptoms do not get better with treatment.  You have a hoarse voice. Get help right away if:  You have pain in your arms, neck, jaw, teeth, or back.  You feel sweaty, dizzy, or light-headed.  You have chest pain or shortness of breath.  You throw up (vomit) and your throw-up looks like blood or coffee grounds.  You pass out (faint).  Your poop (stool) is bloody or black.  You cannot swallow, drink, or eat. Summary  If a person has gastroesophageal reflux disease (GERD), food and stomach acid move back up into the esophagus and cause symptoms or problems such as damage to the esophagus.  Treatment will depend on how bad your symptoms are.  Follow a diet as told by your doctor.  Take all medicines only as told by your doctor. This information is not intended to replace advice given to you by your health care provider. Make sure you discuss any questions you have with your health care provider. Document Revised: 10/29/2017 Document Reviewed: 10/29/2017 Elsevier Patient Education  [place].

## 2019-09-16 LAB — CBC WITH DIFFERENTIAL/PLATELET
Absolute Monocytes: 477 cells/uL (ref 200–950)
Basophils Absolute: 59 cells/uL (ref 0–200)
Basophils Relative: 1.6 %
Eosinophils Absolute: 0 cells/uL — ABNORMAL LOW (ref 15–500)
Eosinophils Relative: 0 %
HCT: 40.2 % (ref 35.0–45.0)
Hemoglobin: 12.8 g/dL (ref 11.7–15.5)
Lymphs Abs: 1125 cells/uL (ref 850–3900)
MCH: 27.8 pg (ref 27.0–33.0)
MCHC: 31.8 g/dL — ABNORMAL LOW (ref 32.0–36.0)
MCV: 87.2 fL (ref 80.0–100.0)
MPV: 10.6 fL (ref 7.5–12.5)
Monocytes Relative: 12.9 %
Neutro Abs: 2039 cells/uL (ref 1500–7800)
Neutrophils Relative %: 55.1 %
Platelets: 220 10*3/uL (ref 140–400)
RBC: 4.61 10*6/uL (ref 3.80–5.10)
RDW: 12.9 % (ref 11.0–15.0)
Total Lymphocyte: 30.4 %
WBC: 3.7 10*3/uL — ABNORMAL LOW (ref 3.8–10.8)

## 2019-09-16 LAB — LIPID PANEL
Cholesterol: 214 mg/dL — ABNORMAL HIGH (ref <200)
HDL: 84 mg/dL (ref >=50)
LDL Cholesterol (Calc): 120 mg/dL (calc) — ABNORMAL HIGH
Non-HDL Cholesterol (Calc): 130 mg/dL (calc) — ABNORMAL HIGH (ref <130)
Total CHOL/HDL Ratio: 2.5 (calc) (ref <5.0)
Triglycerides: 33 mg/dL (ref <150)

## 2019-09-16 LAB — COMPLETE METABOLIC PANEL WITH GFR
AG Ratio: 1.5 (calc) (ref 1.0–2.5)
ALT: 10 U/L (ref 6–29)
AST: 15 U/L (ref 10–35)
Albumin: 3.7 g/dL (ref 3.6–5.1)
Alkaline phosphatase (APISO): 76 U/L (ref 37–153)
BUN: 14 mg/dL (ref 7–25)
CO2: 30 mmol/L (ref 20–32)
Calcium: 9.2 mg/dL (ref 8.6–10.4)
Chloride: 108 mmol/L (ref 98–110)
Creat: 0.54 mg/dL (ref 0.50–0.99)
GFR, Est African American: 112 mL/min/{1.73_m2} (ref >=60)
GFR, Est Non African American: 96 mL/min/{1.73_m2} (ref >=60)
Globulin: 2.5 g/dL (calc) (ref 1.9–3.7)
Glucose, Bld: 86 mg/dL (ref 65–99)
Potassium: 4.2 mmol/L (ref 3.5–5.3)
Sodium: 143 mmol/L (ref 135–146)
Total Bilirubin: 0.6 mg/dL (ref 0.2–1.2)
Total Protein: 6.2 g/dL (ref 6.1–8.1)

## 2019-09-16 LAB — HEMOGLOBIN A1C
Hgb A1c MFr Bld: 5.8 % of total Hgb — ABNORMAL HIGH (ref <5.7)
Mean Plasma Glucose: 120 (calc)
eAG (mmol/L): 6.6 (calc)

## 2019-09-21 NOTE — Addendum Note (Signed)
Addended by: Delsa Grana on: 09/21/2019 02:04 PM   Modules accepted: Orders

## 2019-09-25 DIAGNOSIS — D649 Anemia, unspecified: Secondary | ICD-10-CM | POA: Insufficient documentation

## 2019-09-25 NOTE — Progress Notes (Signed)
[facility]  [phone] Fax:([phone]) [phone]  ID: Ebony [name] OB: January 21, 1951  MR#: [id number]  CHY#:[id number]  Patient Care Team: Delsa Grana, PA-C as PCP - General (Family Medicine)  CHIEF COMPLAINT: Leukopenia.  INTERVAL HISTORY: Patient is a 69 year old female who was noted to have a decreased white blood cell count on routine blood work.  He currently feels well and is asymptomatic.  She denies any recent fevers or illnesses.  She has no new medications.  She has good appetite and denies weight loss.  She has no chest pain, shortness of breath, cough, or hemoptysis.  She denies any nausea, vomiting, constipation, or diarrhea.  She has no urinary complaints.  Patient feels at her baseline offers no specific complaints today.  REVIEW OF SYSTEMS:   Review of Systems  Constitutional: Negative.  Negative for fever, malaise/fatigue and weight loss.  Respiratory: Negative.  Negative for cough, hemoptysis and shortness of breath.   Cardiovascular: Negative.  Negative for chest pain and leg swelling.  Gastrointestinal: Negative.  Negative for abdominal pain.  Genitourinary: Negative.  Negative for dysuria.  Musculoskeletal: Negative.  Negative for back pain.  Skin: Negative.  Negative for rash.  Neurological: Negative.  Negative for dizziness, focal weakness, weakness and headaches.  Psychiatric/Behavioral: The patient is not nervous/anxious.     As per HPI. Otherwise, a complete review of systems is negative.  PAST MEDICAL HISTORY: Past Medical History:  Diagnosis Date  . Allergy   . Anemia   . Arthritis    knees, hands  . Borderline diabetes mellitus   . Complication of anesthesia    slow to wake  . Dry eye syndrome of both eyes   . Hypercholesterolemia   . Prehypertension 12/17/2016  . Wears dentures    full upper and lower    PAST SURGICAL HISTORY: Past Surgical History:  Procedure Laterality Date  . ABDOMINAL HYSTERECTOMY     complete   . COLONOSCOPY WITH PROPOFOL N/A 05/09/2017   Procedure: COLONOSCOPY WITH PROPOFOL;  Surgeon: Lucilla Lame, MD;  Location: [facility];  Service: Endoscopy;  Laterality: N/A;  . EYE SURGERY  08/04/2017  . KNEE SURGERY Bilateral     FAMILY HISTORY: Family History  Problem Relation Age of Onset  . Hypertension Mother   . Stroke Mother        4 mini strokes  . Thyroid disease Mother   . Kidney failure Father   . Diabetes Father   . Hypertension Father   . Kidney disease Father   . Emphysema Maternal Grandmother   . Diabetes Maternal Grandmother   . Hypertension Maternal Grandmother   . Diabetes Maternal Grandfather   . Alcohol abuse Maternal Grandfather   . Diabetes Paternal Grandmother   . Heart attack Paternal Grandmother   . Emphysema Paternal Grandfather   . Diabetes Paternal Grandfather   . Heart disease Paternal Grandfather   . Thyroid disease Sister   . Vitamin D deficiency Sister   . Arthritis Sister   . COPD Sister   . Fibromyalgia Sister   . Hypertension Sister   . Hyperlipidemia Sister   . Asthma Sister   . Thyroid disease Sister   . Breast cancer Other   . Cancer Neg Hx     ADVANCED DIRECTIVES (Y/N):  N  HEALTH MAINTENANCE: Social History   Tobacco Use  . Smoking status: Never Smoker  . Smokeless tobacco: Never Used  Substance Use Topics  . Alcohol use: No  . Drug use: No  Colonoscopy:  PAP:  Bone density:  Lipid panel:  Allergies  Allergen Reactions  . Celecoxib Anxiety and Itching    Current Outpatient Medications  Medication Sig Dispense Refill  . acetaminophen (TYLENOL 8 HOUR) 650 MG CR tablet Take 650 mg by mouth every 8 (eight) hours as needed for pain.    Marland Kitchen aspirin 81 MG EC tablet Take 81 mg by mouth daily.     . Calcium Carbonate-Vit D-Min (CALCIUM 1200 PO) Take 600 mg by mouth daily.     Rolena Infante Sagrada 450 MG CAPS Take 450 mg by mouth daily as needed.     . COD LIVER OIL/VITAMINS A & D PO Take 1 tablet by mouth daily.     . Cyanocobalamin (VITAMIN B-12) 1000 MCG SUBL Place 1,000 mcg under the tongue daily.     Marland Kitchen erythromycin ophthalmic ointment Apply small amount to left eye at bedtime    . fluticasone (FLONASE) 50 MCG/ACT nasal spray Place 2 sprays into both nostrils daily. 16 g 3  . Homeopathic Products ([employer]S LEG CRAMPS PAIN RELIEF PO) Take 1 tablet by mouth daily as needed (leg cramps).     Marland Kitchen levocetirizine (XYZAL) 5 MG tablet TAKE 1 TABLET BY MOUTH EVERY DAY IN THE EVENING 90 tablet 0  . magnesium oxide (MAG-OX) 400 MG tablet Take 400 mg by mouth daily.    Marland Kitchen OVER THE COUNTER MEDICATION Take 1,000 mg by mouth daily. **Black Cumin Seed Oil**    . OVER THE COUNTER MEDICATION Take 1 capsule by mouth as directed. [place]     . OVER THE COUNTER MEDICATION Take 1 Dose by mouth as directed. Sugar Metabolism Support  (Cinnamon 2090m plus Chromium)     . pantoprazole (PROTONIX) 40 MG tablet Take 1 tablet (40 mg total) by mouth daily. 30 tablet 3  . Potassium Gluconate 2.5 MEQ TABS Take 1 tablet by mouth daily.    . pseudoephedrine (SUDAFED) 30 MG tablet Take 30 mg by mouth daily.    .Marland KitchenREFRESH CELLUVISC 1 % GEL     . rosuvastatin (CRESTOR) 20 MG tablet TAKE 1 TABLET BY MOUTH EVERYDAY AT BEDTIME 90 tablet 3  . TURMERIC CURCUMIN PO Take 1 capsule by mouth 3 (three) times a week.     No current facility-administered medications for this visit.    OBJECTIVE: Vitals:   09/27/19 1127  BP: 128/86  Pulse: 69  Resp: 20  Temp: (!) 96.8 F (36 C)  SpO2: 97%     Body mass index is 22.47 kg/m.    ECOG FS:0 - Asymptomatic  General: Well-developed, well-nourished, no acute distress. Eyes: Pink conjunctiva, anicteric sclera. HEENT: Normocephalic, moist mucous membranes. Lungs: No audible wheezing or coughing. Heart: Regular rate and rhythm. Abdomen: Soft, nontender, no obvious distention. Musculoskeletal: No edema, cyanosis, or clubbing. Neuro: Alert, answering all questions  appropriately. Cranial nerves grossly intact. Skin: No rashes or petechiae noted. Psych: Normal affect. Lymphatics: No cervical, calvicular, axillary or inguinal LAD.   LAB RESULTS:  Lab Results  Component Value Date   NA 143 09/15/2019   K 4.2 09/15/2019   CL 108 09/15/2019   CO2 30 09/15/2019   GLUCOSE 86 09/15/2019   BUN 14 09/15/2019   CREATININE 0.54 09/15/2019   CALCIUM 9.2 09/15/2019   PROT 6.2 09/15/2019   ALBUMIN 3.9 12/17/2016   AST 15 09/15/2019   ALT 10 09/15/2019   ALKPHOS 79 12/17/2016   BILITOT 0.6 09/15/2019   GFRNONAA 96 09/15/2019  GFRAA 112 09/15/2019    Lab Results  Component Value Date   WBC 3.9 (L) 09/27/2019   NEUTROABS 2.0 09/27/2019   HGB 12.6 09/27/2019   HCT 38.9 09/27/2019   MCV 87.8 09/27/2019   PLT 213 09/27/2019     STUDIES: No results found.  ASSESSMENT: Leukopenia.  PLAN:   1.  Leukopenia: Patient's white blood cell count is only mildly decreased, but slightly improved over the past several weeks.  Iron stores and folate are within normal limits.  B12, peripheral blood flow cytometry, and neutrophil antibodies are pending at time of dictation.  No intervention is needed.  Patient does not require bone marrow biopsy.  She will have a video assisted telemedicine visit in 3 weeks to discuss her results.  If the remainder of her laboratory work returns normal, she likely can be discharged from clinic.  I spent a total of 45 minutes reviewing chart data, face-to-face evaluation with the patient, counseling and coordination of care as detailed above.   Patient expressed understanding and was in agreement with this plan. She also understands that She can call clinic at any time with any questions, concerns, or complaints.    Lloyd Huger, MD   09/27/2019 1:20 PM

## 2019-09-27 ENCOUNTER — Encounter: Payer: Self-pay | Admitting: Oncology

## 2019-09-27 ENCOUNTER — Inpatient Hospital Stay: Payer: Medicare HMO | Attending: Oncology | Admitting: Oncology

## 2019-09-27 ENCOUNTER — Inpatient Hospital Stay: Payer: Medicare HMO

## 2019-09-27 ENCOUNTER — Other Ambulatory Visit: Payer: Self-pay

## 2019-09-27 VITALS — BP 128/86 | HR 69 | Temp 96.8°F | Resp 20 | Wt 147.8 lb

## 2019-09-27 DIAGNOSIS — Z8249 Family history of ischemic heart disease and other diseases of the circulatory system: Secondary | ICD-10-CM | POA: Diagnosis not present

## 2019-09-27 DIAGNOSIS — Z833 Family history of diabetes mellitus: Secondary | ICD-10-CM | POA: Insufficient documentation

## 2019-09-27 DIAGNOSIS — Z79899 Other long term (current) drug therapy: Secondary | ICD-10-CM | POA: Insufficient documentation

## 2019-09-27 DIAGNOSIS — E119 Type 2 diabetes mellitus without complications: Secondary | ICD-10-CM | POA: Diagnosis not present

## 2019-09-27 DIAGNOSIS — Z803 Family history of malignant neoplasm of breast: Secondary | ICD-10-CM

## 2019-09-27 DIAGNOSIS — D709 Neutropenia, unspecified: Secondary | ICD-10-CM

## 2019-09-27 DIAGNOSIS — Z7982 Long term (current) use of aspirin: Secondary | ICD-10-CM | POA: Insufficient documentation

## 2019-09-27 DIAGNOSIS — Z8261 Family history of arthritis: Secondary | ICD-10-CM | POA: Insufficient documentation

## 2019-09-27 DIAGNOSIS — E7800 Pure hypercholesterolemia, unspecified: Secondary | ICD-10-CM | POA: Insufficient documentation

## 2019-09-27 DIAGNOSIS — D72819 Decreased white blood cell count, unspecified: Secondary | ICD-10-CM | POA: Diagnosis not present

## 2019-09-27 DIAGNOSIS — Z90710 Acquired absence of both cervix and uterus: Secondary | ICD-10-CM | POA: Diagnosis not present

## 2019-09-27 DIAGNOSIS — Z8349 Family history of other endocrine, nutritional and metabolic diseases: Secondary | ICD-10-CM | POA: Diagnosis not present

## 2019-09-27 LAB — CBC WITH DIFFERENTIAL/PLATELET
Abs Immature Granulocytes: 0.01 10*3/uL (ref 0.00–0.07)
Basophils Absolute: 0.1 10*3/uL (ref 0.0–0.1)
Basophils Relative: 1 %
Eosinophils Absolute: 0 10*3/uL (ref 0.0–0.5)
Eosinophils Relative: 0 %
HCT: 38.9 % (ref 36.0–46.0)
Hemoglobin: 12.6 g/dL (ref 12.0–15.0)
Immature Granulocytes: 0 %
Lymphocytes Relative: 33 %
Lymphs Abs: 1.3 10*3/uL (ref 0.7–4.0)
MCH: 28.4 pg (ref 26.0–34.0)
MCHC: 32.4 g/dL (ref 30.0–36.0)
MCV: 87.8 fL (ref 80.0–100.0)
Monocytes Absolute: 0.5 10*3/uL (ref 0.1–1.0)
Monocytes Relative: 12 %
Neutro Abs: 2 10*3/uL (ref 1.7–7.7)
Neutrophils Relative %: 54 %
Platelets: 213 10*3/uL (ref 150–400)
RBC: 4.43 MIL/uL (ref 3.87–5.11)
RDW: 13.4 % (ref 11.5–15.5)
WBC: 3.9 10*3/uL — ABNORMAL LOW (ref 4.0–10.5)
nRBC: 0 % (ref 0.0–0.2)

## 2019-09-27 LAB — IRON AND TIBC
Iron: 45 ug/dL (ref 28–170)
Saturation Ratios: 12 % (ref 10.4–31.8)
TIBC: 388 ug/dL (ref 250–450)
UIBC: 343 ug/dL

## 2019-09-27 LAB — VITAMIN B12: Vitamin B-12: 1605 pg/mL — ABNORMAL HIGH (ref 180–914)

## 2019-09-27 LAB — FOLATE: Folate: 9.2 ng/mL (ref >5.9)

## 2019-09-27 LAB — FERRITIN: Ferritin: 13 ng/mL (ref 11–307)

## 2019-09-29 DIAGNOSIS — R002 Palpitations: Secondary | ICD-10-CM | POA: Insufficient documentation

## 2019-09-29 DIAGNOSIS — E782 Mixed hyperlipidemia: Secondary | ICD-10-CM | POA: Diagnosis not present

## 2019-09-29 DIAGNOSIS — E7800 Pure hypercholesterolemia, unspecified: Secondary | ICD-10-CM | POA: Diagnosis not present

## 2019-09-29 DIAGNOSIS — R7303 Prediabetes: Secondary | ICD-10-CM | POA: Diagnosis not present

## 2019-09-29 HISTORY — DX: Palpitations: R00.2

## 2019-09-30 LAB — COMP PANEL: LEUKEMIA/LYMPHOMA

## 2019-10-14 LAB — NEUTROPHIL AB TEST LEVEL 1: NEUTROPHIL SCR/PANEL INTERP.: POSITIVE — AB

## 2019-10-17 NOTE — Progress Notes (Deleted)
[name]  [phone] [phone]) [phone]  ID: Ebony [name] OB: Feb 25, 1951  MR#: [id number]  CSN#:[id number]  Patient Care Team: Delsa Grana, PA-C as PCP - General (Family Medicine) Lloyd Huger, MD as Consulting Physician (Oncology)  I connected with Ebony [name] on 10/17/19 at  3:30 PM EDT by {Blank single:19197::"video enabled telemedicine visit","telephone visit"} and verified that I am speaking with the correct person using two identifiers.   I discussed the limitations, risks, security and privacy concerns of performing an evaluation and management service by telemedicine and the availability of in-person appointments. I also discussed with the patient that there may be a patient responsible charge related to this service. The patient expressed understanding and agreed to proceed.   Other persons participating in the visit and their role in the encounter: Patient, MD.  Patient's location: Home. Provider's location: Clinic.  CHIEF COMPLAINT: Leukopenia.  INTERVAL HISTORY: Patient is a 69 year old female who was noted to have a decreased white blood cell count on routine blood work.  He currently feels well and is asymptomatic.  She denies any recent fevers or illnesses.  She has no new medications.  She has good appetite and denies weight loss.  She has no chest pain, shortness of breath, cough, or hemoptysis.  She denies any nausea, vomiting, constipation, or diarrhea.  She has no urinary complaints.  Patient feels at her baseline offers no specific complaints today.  REVIEW OF SYSTEMS:   Review of Systems  Constitutional: Negative.  Negative for fever, malaise/fatigue and weight loss.  Respiratory: Negative.  Negative for cough, hemoptysis and shortness of breath.   Cardiovascular: Negative.  Negative for chest pain and leg swelling.  Gastrointestinal: Negative.  Negative for abdominal pain.  Genitourinary: Negative.  Negative for  dysuria.  Musculoskeletal: Negative.  Negative for back pain.  Skin: Negative.  Negative for rash.  Neurological: Negative.  Negative for dizziness, focal weakness, weakness and headaches.  Psychiatric/Behavioral: The patient is not nervous/anxious.     As per HPI. Otherwise, a complete review of systems is negative.  PAST MEDICAL HISTORY: Past Medical History:  Diagnosis Date  . Allergy   . Anemia   . Arthritis    knees, hands  . Borderline diabetes mellitus   . Complication of anesthesia    slow to wake  . Dry eye syndrome of both eyes   . Hypercholesterolemia   . Prehypertension 12/17/2016  . Wears dentures    full upper and lower    PAST SURGICAL HISTORY: Past Surgical History:  Procedure Laterality Date  . ABDOMINAL HYSTERECTOMY     complete  . COLONOSCOPY WITH PROPOFOL N/A 05/09/2017   Procedure: COLONOSCOPY WITH PROPOFOL;  Surgeon: Lucilla Lame, MD;  Location: [facility];  Service: Endoscopy;  Laterality: N/A;  . EYE SURGERY  08/04/2017  . KNEE SURGERY Bilateral     FAMILY HISTORY: Family History  Problem Relation Age of Onset  . Hypertension Mother   . Stroke Mother        4 mini strokes  . Thyroid disease Mother   . Kidney failure Father   . Diabetes Father   . Hypertension Father   . Kidney disease Father   . Emphysema Maternal Grandmother   . Diabetes Maternal Grandmother   . Hypertension Maternal Grandmother   . Diabetes Maternal Grandfather   . Alcohol abuse Maternal Grandfather   . Diabetes Paternal Grandmother   . Heart attack Paternal Grandmother   . Emphysema Paternal Grandfather   .  Diabetes Paternal Grandfather   . Heart disease Paternal Grandfather   . Thyroid disease Sister   . Vitamin D deficiency Sister   . Arthritis Sister   . COPD Sister   . Fibromyalgia Sister   . Hypertension Sister   . Hyperlipidemia Sister   . Asthma Sister   . Thyroid disease Sister   . Breast cancer Other   . Cancer Neg Hx     ADVANCED  DIRECTIVES (Y/N):  N  HEALTH MAINTENANCE: Social History   Tobacco Use  . Smoking status: Never Smoker  . Smokeless tobacco: Never Used  Vaping Use  . Vaping Use: Never used  Substance Use Topics  . Alcohol use: No  . Drug use: No     Colonoscopy:  PAP:  Bone density:  Lipid panel:  Allergies  Allergen Reactions  . Celecoxib Anxiety and Itching    Current Outpatient Medications  Medication Sig Dispense Refill  . acetaminophen (TYLENOL 8 HOUR) 650 MG CR tablet Take 650 mg by mouth every 8 (eight) hours as needed for pain.    Marland Kitchen aspirin 81 MG EC tablet Take 81 mg by mouth daily.     . Calcium Carbonate-Vit D-Min (CALCIUM 1200 PO) Take 600 mg by mouth daily.     Rolena Infante Sagrada 450 MG CAPS Take 450 mg by mouth daily as needed.     . COD LIVER OIL/VITAMINS A & D PO Take 1 tablet by mouth daily.    . Cyanocobalamin (VITAMIN B-12) 1000 MCG SUBL Place 1,000 mcg under the tongue daily.     Marland Kitchen erythromycin ophthalmic ointment Apply small amount to left eye at bedtime    . fluticasone (FLONASE) 50 MCG/ACT nasal spray Place 2 sprays into both nostrils daily. 16 g 3  . Homeopathic Products ([employer] LEG CRAMPS PAIN RELIEF PO) Take 1 tablet by mouth daily as needed (leg cramps).     Marland Kitchen levocetirizine (XYZAL) 5 MG tablet TAKE 1 TABLET BY MOUTH EVERY DAY IN THE EVENING 90 tablet 0  . magnesium oxide (MAG-OX) 400 MG tablet Take 400 mg by mouth daily.    Marland Kitchen OVER THE COUNTER MEDICATION Take 1,000 mg by mouth daily. **Black Cumin Seed Oil**    . OVER THE COUNTER MEDICATION Take 1 capsule by mouth as directed. Chamizal     . OVER THE COUNTER MEDICATION Take 1 Dose by mouth as directed. Sugar Metabolism Support  (Cinnamon 2059m plus Chromium)     . pantoprazole (PROTONIX) 40 MG tablet Take 1 tablet (40 mg total) by mouth daily. 30 tablet 3  . Potassium Gluconate 2.5 MEQ TABS Take 1 tablet by mouth daily.    . pseudoephedrine (SUDAFED) 30 MG tablet Take 30 mg by mouth daily.     .Marland KitchenREFRESH CELLUVISC 1 % GEL     . rosuvastatin (CRESTOR) 20 MG tablet TAKE 1 TABLET BY MOUTH EVERYDAY AT BEDTIME 90 tablet 3  . TURMERIC CURCUMIN PO Take 1 capsule by mouth 3 (three) times a week.     No current facility-administered medications for this visit.    OBJECTIVE: There were no vitals filed for this visit.   There is no height or weight on file to calculate BMI.    ECOG FS:0 - Asymptomatic  General: Well-developed, well-nourished, no acute distress. Eyes: Pink conjunctiva, anicteric sclera. HEENT: Normocephalic, moist mucous membranes. Lungs: No audible wheezing or coughing. Heart: Regular rate and rhythm. Abdomen: Soft, nontender, no obvious distention. Musculoskeletal: No edema, cyanosis, or  clubbing. Neuro: Alert, answering all questions appropriately. Cranial nerves grossly intact. Skin: No rashes or petechiae noted. Psych: Normal affect. Lymphatics: No cervical, calvicular, axillary or inguinal LAD.   LAB RESULTS:  Lab Results  Component Value Date   NA 143 09/15/2019   K 4.2 09/15/2019   CL 108 09/15/2019   CO2 30 09/15/2019   GLUCOSE 86 09/15/2019   BUN 14 09/15/2019   CREATININE 0.54 09/15/2019   CALCIUM 9.2 09/15/2019   PROT 6.2 09/15/2019   ALBUMIN 3.9 12/17/2016   AST 15 09/15/2019   ALT 10 09/15/2019   ALKPHOS 79 12/17/2016   BILITOT 0.6 09/15/2019   GFRNONAA 96 09/15/2019   GFRAA 112 09/15/2019    Lab Results  Component Value Date   WBC 3.9 (L) 09/27/2019   NEUTROABS 2.0 09/27/2019   HGB 12.6 09/27/2019   HCT 38.9 09/27/2019   MCV 87.8 09/27/2019   PLT 213 09/27/2019     STUDIES: No results found.  ASSESSMENT: Leukopenia.  PLAN:   1.  Leukopenia: Patient's white blood cell count is only mildly decreased, but slightly improved over the past several weeks.  Iron stores and folate are within normal limits.  B12, peripheral blood flow cytometry, and neutrophil antibodies are pending at time of dictation.  No intervention is needed.   Patient does not require bone marrow biopsy.  She will have a video assisted telemedicine visit in 3 weeks to discuss her results.  If the remainder of her laboratory work returns normal, she likely can be discharged from clinic.  I provided *** minutes of {Blank single:19197::"face-to-face video visit time","non face-to-face telephone visit time"} during this encounter which included chart review, counseling, and coordination of care as documented above.  Patient expressed understanding and was in agreement with this plan. She also understands that She can call clinic at any time with any questions, concerns, or complaints.    Lloyd Huger, MD   10/17/2019 8:14 AM

## 2019-10-18 ENCOUNTER — Ambulatory Visit: Payer: Medicare HMO | Admitting: Family Medicine

## 2019-10-19 ENCOUNTER — Inpatient Hospital Stay: Payer: Medicare HMO | Admitting: Oncology

## 2019-10-28 NOTE — Progress Notes (Signed)
°  [facility]  [phone] Fax:(336) [phone]  ID: [name] OB: [birth year]  MR#: [id number]  EFU#:[id number]  Patient Care Team: Delsa Grana, Hershal Coria as PCP - General (Family Medicine) Lloyd Huger, MD as Consulting Physician (Oncology)    Lloyd Huger, MD   11/03/2019 6:58 AM     This encounter was created in error - please disregard.

## 2019-11-01 ENCOUNTER — Encounter: Payer: Self-pay | Admitting: Family Medicine

## 2019-11-01 ENCOUNTER — Ambulatory Visit: Payer: Medicare HMO | Admitting: Family Medicine

## 2019-11-01 ENCOUNTER — Other Ambulatory Visit: Payer: Self-pay

## 2019-11-01 VITALS — BP 120/84 | HR 75 | Temp 97.1°F | Resp 16 | Ht 68.0 in | Wt 144.2 lb

## 2019-11-01 DIAGNOSIS — H6983 Other specified disorders of Eustachian tube, bilateral: Secondary | ICD-10-CM | POA: Diagnosis not present

## 2019-11-01 DIAGNOSIS — D709 Neutropenia, unspecified: Secondary | ICD-10-CM

## 2019-11-01 DIAGNOSIS — J324 Chronic pansinusitis: Secondary | ICD-10-CM | POA: Diagnosis not present

## 2019-11-01 DIAGNOSIS — E782 Mixed hyperlipidemia: Secondary | ICD-10-CM

## 2019-11-01 DIAGNOSIS — K219 Gastro-esophageal reflux disease without esophagitis: Secondary | ICD-10-CM | POA: Diagnosis not present

## 2019-11-01 MED ORDER — AZELASTINE HCL 0.1 % NA SOLN: 2.0000 | Freq: Every day | NASAL | 12 refills | Status: DC

## 2019-11-01 NOTE — Progress Notes (Signed)
Patient ID: Ebony [name], female    DOB: 1951-02-18, 69 y.o.   MRN: [id number]  PCP: Delsa Grana, PA-C  Chief Complaint  Patient presents with  . Follow-up    1 month  . Gastroesophageal Reflux    Subjective:   Ebony [name] is a 69 y.o. female, presents to clinic with CC of the following:  HPI  Pt here for f/up on GERD sx Last OV 09/15/2019, about 6 weeks ago- A&P reviewed  7. Gastroesophageal reflux disease, unspecified whether esophagitis present Still sx most days, increase protonix to 40 mg and take daily in the morning for the next 2-4 weeks consistently, can use additional pepcid or zantac PRN - possibly gastritis/gastric ulcer?  Tx more consistently and if not improving will refer to GI for eval. GERD info given to pt in hand out with instructions regarding meds and plan.  Pt was in agreement.   Return for 4 months routine f/up - come back in 1 month if reflux/GI symptoms are not improved.  Overall severe sx esp in chest has improved, but she is still getting frequent sx with protonix 40 mg, more epigastric now, intermittent, easily triggered with certain foods, some early fullness, sometimes burning, N, and or bloating  She has high LDL - unclear med compliance.  No answer last month when we called her to give lab results and f/up again on how she was taking statin   She had neutropenia and was referred to hematology  She saw her cardiologist on 5/26, OV reviewed today with pt   Per cardiology, d/c crestor and started pravastatin  Recent lipid panel 09/15/2019 reveals: Total cholesterol of 214, HDL 84, triglycerides 33, LDL 120.   They will follow lipids - checked at [facility] clinic, reviewed through [facility]    Patient Active Problem List   Diagnosis Date Noted  . Palpitation 09/29/2019  . Anemia, unspecified 09/25/2019  . Chest pain 02/25/2019  . B12 deficiency 12/30/2018  . Hyperlipidemia 07/01/2018  . Prediabetes 07/01/2018  . Neutropenia,  unspecified type ([place]) 12/18/2016  . Adjustment disorder with mixed anxiety and depressed mood 10/11/2015  . Heartburn 10/11/2015  . Arthritis of knee, degenerative 07/18/2015      Current Outpatient Medications:  .  acetaminophen (TYLENOL 8 HOUR) 650 MG CR tablet, Take 650 mg by mouth every 8 (eight) hours as needed for pain., Disp: , Rfl:  .  aspirin 81 MG EC tablet, Take 81 mg by mouth daily. , Disp: , Rfl:  .  Calcium Carbonate-Vit D-Min (CALCIUM 1200 PO), Take 600 mg by mouth daily. , Disp: , Rfl:  .  Cascara Sagrada 450 MG CAPS, Take 450 mg by mouth daily as needed. , Disp: , Rfl:  .  COD LIVER OIL/VITAMINS A & D PO, Take 1 tablet by mouth daily., Disp: , Rfl:  .  Cyanocobalamin (VITAMIN B-12) 1000 MCG SUBL, Place 1,000 mcg under the tongue daily. , Disp: , Rfl:  .  erythromycin ophthalmic ointment, Apply small amount to left eye at bedtime, Disp: , Rfl:  .  fluticasone (FLONASE) 50 MCG/ACT nasal spray, Place 2 sprays into both nostrils daily., Disp: 16 g, Rfl: 3 .  Homeopathic Products ([employer] LEG CRAMPS PAIN RELIEF PO), Take 1 tablet by mouth daily as needed (leg cramps). , Disp: , Rfl:  .  levocetirizine (XYZAL) 5 MG tablet, TAKE 1 TABLET BY MOUTH EVERY DAY IN THE EVENING, Disp: 90 tablet, Rfl: 0 .  magnesium oxide (MAG-OX) 400  MG tablet, Take 400 mg by mouth daily., Disp: , Rfl:  .  meloxicam (MOBIC) 7.5 MG tablet, , Disp: , Rfl:  .  OVER THE COUNTER MEDICATION, Take 1,000 mg by mouth daily. **Black Cumin Seed Oil**, Disp: , Rfl:  .  OVER THE COUNTER MEDICATION, Take 1 capsule by mouth as directed. VisionShield Eye Health supplement , Disp: , Rfl:  .  OVER THE COUNTER MEDICATION, Take 1 Dose by mouth as directed. Sugar Metabolism Support  (Cinnamon 2000mg  plus Chromium) , Disp: , Rfl:  .  Potassium Gluconate 2.5 MEQ TABS, Take 1 tablet by mouth daily., Disp: , Rfl:  .  pravastatin (PRAVACHOL) 40 MG tablet, Take 1 tablet by mouth at bedtime. , Disp: , Rfl:  .  pseudoephedrine  (SUDAFED) 30 MG tablet, Take 30 mg by mouth daily., Disp: , Rfl:  .  REFRESH CELLUVISC 1 % GEL, , Disp: , Rfl:  .  TURMERIC CURCUMIN PO, Take 1 capsule by mouth 3 (three) times a week., Disp: , Rfl:  .  rosuvastatin (CRESTOR) 20 MG tablet, TAKE 1 TABLET BY MOUTH EVERYDAY AT BEDTIME (Patient not taking: Reported on 11/01/2019), Disp: 90 tablet, Rfl: 3   Allergies  Allergen Reactions  . Celecoxib Anxiety and Itching     Social History   Tobacco Use  . Smoking status: Never Smoker  . Smokeless tobacco: Never Used  Vaping Use  . Vaping Use: Never used  Substance Use Topics  . Alcohol use: No  . Drug use: No      Chart Review Today: I have reviewed the patient's medical history in detail and updated the computerized patient record.   Review of Systems 10 Systems reviewed and are negative for acute change except as noted in the HPI.     Objective:   Vitals:   11/01/19 1002  BP: 120/84  Pulse: 75  Resp: 16  Temp: (!) 97.1 F (36.2 C)  TempSrc: Temporal  SpO2: 96%  Weight: 144 lb 3.2 oz (65.4 kg)  Height: 5\' 8"  (1.727 m)    Body mass index is 21.93 kg/m.  Physical Exam Vitals and nursing note reviewed.  Constitutional:      General: She is not in acute distress.    Appearance: Normal appearance. She is well-developed. She is not ill-appearing, toxic-appearing or diaphoretic.     Interventions: Face mask in place.  HENT:     Head: Normocephalic and atraumatic.     Right Ear: Hearing, tympanic membrane, ear canal and external ear normal. No swelling or tenderness. No middle ear effusion.     Left Ear: Hearing, tympanic membrane, ear canal and external ear normal. No swelling or tenderness.  No middle ear effusion.     Nose: Mucosal edema and rhinorrhea present. No nasal tenderness or congestion.     Right Turbinates: Enlarged and swollen.     Left Turbinates: Enlarged and swollen.     Right Sinus: No maxillary sinus tenderness or frontal sinus tenderness.     Left  Sinus: No maxillary sinus tenderness or frontal sinus tenderness.     Mouth/Throat:     Mouth: Mucous membranes are moist. Mucous membranes are not pale.     Pharynx: Oropharynx is clear. Uvula midline. No oropharyngeal exudate or uvula swelling.     Tonsils: No tonsillar abscesses.  Eyes:     General: Lids are normal. No scleral icterus.       Right eye: No discharge.        Left  eye: No discharge.     Conjunctiva/sclera: Conjunctivae normal.     Pupils: Pupils are equal, round, and reactive to light.  Neck:     Trachea: Phonation normal. No tracheal deviation.  Cardiovascular:     Rate and Rhythm: Normal rate and regular rhythm.     Pulses: Normal pulses.          Radial pulses are 2+ on the right side and 2+ on the left side.       Posterior tibial pulses are 2+ on the right side and 2+ on the left side.     Heart sounds: Normal heart sounds. No murmur heard.  No friction rub. No gallop.   Pulmonary:     Effort: Pulmonary effort is normal. No respiratory distress.     Breath sounds: Normal breath sounds. No stridor. No wheezing, rhonchi or rales.  Chest:     Chest wall: No tenderness.  Abdominal:     General: Bowel sounds are normal. There is no distension.     Palpations: Abdomen is soft.     Tenderness: There is no guarding or rebound.     Comments: Mild epigastric ttp w/o guarding or rebound  Musculoskeletal:        General: No deformity. Normal range of motion.     Cervical back: Normal range of motion and neck supple.     Right lower leg: No edema.     Left lower leg: No edema.  Lymphadenopathy:     Cervical: No cervical adenopathy.  Skin:    General: Skin is warm and dry.     Capillary Refill: Capillary refill takes less than 2 seconds.     Coloration: Skin is not jaundiced or pale.     Findings: No rash.  Neurological:     Mental Status: She is alert and oriented to person, place, and time.     Motor: No abnormal muscle tone.     Coordination: Coordination  normal.     Gait: Gait normal.  Psychiatric:        Speech: Speech normal.        Behavior: Behavior normal.      Results for orders placed or performed in visit on 09/27/19  Vitamin B12  Result Value Ref Range   Vitamin B-12 1,605 (H) 180 - 914 pg/mL  Iron and TIBC  Result Value Ref Range   Iron 45 28 - 170 ug/dL   TIBC 388 250 - 450 ug/dL   Saturation Ratios 12 10.4 - 31.8 %   UIBC 343 ug/dL  Ferritin  Result Value Ref Range   Ferritin 13 11 - 307 ng/mL  NEUTROPHIL AB TEST LEVEL 1  Result Value Ref Range   NEUTROPHIL SCR/PANEL INTERP. Positive (A)   Folate  Result Value Ref Range   Folate 9.2 >5.9 ng/mL  Flow cytometry panel-leukemia/lymphoma work-up  Result Value Ref Range   PATH INTERP XXX-IMP Comment    CLINICAL INFO Comment    Specimen Type Comment    ASSESSMENT OF LEUKOCYTES Comment    % Viable Cells Comment    ANALYSIS AND GATING STRATEGY Comment    IMMUNOPHENOTYPING STUDY Comment    PATHOLOGIST NAME Comment    COMMENT: Comment   CBC with Differential/Platelet  Result Value Ref Range   WBC 3.9 (L) 4.0 - 10.5 K/uL   RBC 4.43 3.87 - 5.11 MIL/uL   Hemoglobin 12.6 12.0 - 15.0 g/dL   HCT 38.9 36 - 46 %   MCV  87.8 80.0 - 100.0 fL   MCH 28.4 26.0 - 34.0 pg   MCHC 32.4 30.0 - 36.0 g/dL   RDW 13.4 11.5 - 15.5 %   Platelets 213 150 - 400 K/uL   nRBC 0.0 0.0 - 0.2 %   Neutrophils Relative % 54 %   Neutro Abs 2.0 1.7 - 7.7 K/uL   Lymphocytes Relative 33 %   Lymphs Abs 1.3 0.7 - 4.0 K/uL   Monocytes Relative 12 %   Monocytes Absolute 0.5 0 - 1 K/uL   Eosinophils Relative 0 %   Eosinophils Absolute 0.0 0 - 0 K/uL   Basophils Relative 1 %   Basophils Absolute 0.1 0 - 0 K/uL   Immature Granulocytes 0 %   Abs Immature Granulocytes 0.01 0.00 - 0.07 K/uL       Assessment & Plan:     ICD-10-CM   1. Gastroesophageal reflux disease, unspecified whether esophagitis present  K21.9 Ambulatory referral to Gastroenterology   continued sx with PPI, though most severe  sx have improved, encouraged diet/lifestyle and adding pepcid, f/up with GI due to months of sx  2. Mixed hyperlipidemia  E78.2    started statin per cardiology, so far tolerating, no SE or concerns Not due to labs for a few more months, encouraged improved compliance and continued healthy diet and exercise  3. Neutropenia, unspecified type ([place])  D70.9    Referred to hematology  4. Chronic pansinusitis  J32.4 azelastine (ASTELIN) 0.1 % nasal spray   complains of ear sx/feeling blocked, likely due to chronic nasa/sinus sx, reviewed OTC mangaement for AR/chronic rhinosinusitis Tx with antihistamine, flonase or trial of azelastine? atrovent nose spray? If sx worsen or do not improve with improved maintenance and management could refer to ENT   5. Eustachian tube dysfunction, bilateral  H69.83 azelastine (ASTELIN) 0.1 % nasal spray  Ear sx likely ETD- reviewed management - add decongestants for sx relief, TM normal, no current concerns for bacterial infection to sinuses or ears      Delsa Grana, PA-C 11/01/19 10:11 AM

## 2019-11-02 ENCOUNTER — Inpatient Hospital Stay: Payer: Medicare HMO | Admitting: Oncology

## 2019-11-02 ENCOUNTER — Encounter: Payer: Self-pay | Admitting: Oncology

## 2019-11-02 NOTE — Progress Notes (Signed)
Patient prescreened for appointment. Patient has no concerns or questions.  

## 2019-11-03 ENCOUNTER — Telehealth: Payer: Self-pay

## 2019-11-03 NOTE — Telephone Encounter (Signed)
She is fine. No concerns.  Keep new appointment for lab and md as scheduled.

## 2019-11-03 NOTE — Telephone Encounter (Signed)
Called patient back and discussed provider's response. She was very Patent attorney. She denied other concerns at this time.

## 2019-11-03 NOTE — Telephone Encounter (Signed)
Patient would like to know if she is "ok". She states she waited for Dr. Grayland Ormond to call yesterday for mychart visit but never got a call. I explained we had used both numbers she provided [place] and the provider had tried several times with no success. I informed her that her appointment had to be canceled and rescheduled. She is just very concerned and feels like something is wrong. Please advise.

## 2019-11-17 ENCOUNTER — Other Ambulatory Visit: Payer: Self-pay | Admitting: Family Medicine

## 2019-11-17 DIAGNOSIS — H6983 Other specified disorders of Eustachian tube, bilateral: Secondary | ICD-10-CM

## 2019-11-17 DIAGNOSIS — J324 Chronic pansinusitis: Secondary | ICD-10-CM

## 2019-12-10 NOTE — Progress Notes (Signed)
[facility]  [phone] [phone]) [phone]  ID: Ebony [name] OB: Oct 23, 1950  MR#: [id number]  CHE#:[id number]  Patient Care Team: Delsa Grana, PA-C as PCP - General (Family Medicine) Lloyd Huger, MD as Consulting Physician (Oncology)  CHIEF COMPLAINT: Leukopenia.  INTERVAL HISTORY: Patient returns to clinic today for repeat laboratory work and further evaluation.  She continues to feel well and remains asymptomatic.  She has no neurologic complaints.  She denies any recent fevers or illnesses. She has good appetite and denies weight loss.  She has no chest pain, shortness of breath, cough, or hemoptysis.  She denies any nausea, vomiting, constipation, or diarrhea.  She has no urinary complaints.  Patient offers no specific complaints today.  REVIEW OF SYSTEMS:   Review of Systems  Constitutional: Negative.  Negative for fever, malaise/fatigue and weight loss.  Respiratory: Negative.  Negative for cough, hemoptysis and shortness of breath.   Cardiovascular: Negative.  Negative for chest pain and leg swelling.  Gastrointestinal: Negative.  Negative for abdominal pain.  Genitourinary: Negative.  Negative for dysuria.  Musculoskeletal: Negative.  Negative for back pain.  Skin: Negative.  Negative for rash.  Neurological: Negative.  Negative for dizziness, focal weakness, weakness and headaches.  Psychiatric/Behavioral: The patient is not nervous/anxious.     As per HPI. Otherwise, a complete review of systems is negative.  PAST MEDICAL HISTORY: Past Medical History:  Diagnosis Date  . Allergy   . Anemia   . Arthritis    knees, hands  . Borderline diabetes mellitus   . Complication of anesthesia    slow to wake  . Dry eye syndrome of both eyes   . Hypercholesterolemia   . Prehypertension 12/17/2016  . Wears dentures    full upper and lower    PAST SURGICAL HISTORY: Past Surgical History:  Procedure Laterality Date  . ABDOMINAL  HYSTERECTOMY     complete  . COLONOSCOPY WITH PROPOFOL N/A 05/09/2017   Procedure: COLONOSCOPY WITH PROPOFOL;  Surgeon: Lucilla Lame, MD;  Location: [facility];  Service: Endoscopy;  Laterality: N/A;  . EYE SURGERY  08/04/2017  . KNEE SURGERY Bilateral     FAMILY HISTORY: Family History  Problem Relation Age of Onset  . Hypertension Mother   . Stroke Mother        4 mini strokes  . Thyroid disease Mother   . Kidney failure Father   . Diabetes Father   . Hypertension Father   . Kidney disease Father   . Emphysema Maternal Grandmother   . Diabetes Maternal Grandmother   . Hypertension Maternal Grandmother   . Diabetes Maternal Grandfather   . Alcohol abuse Maternal Grandfather   . Diabetes Paternal Grandmother   . Heart attack Paternal Grandmother   . Emphysema Paternal Grandfather   . Diabetes Paternal Grandfather   . Heart disease Paternal Grandfather   . Thyroid disease Sister   . Vitamin D deficiency Sister   . Arthritis Sister   . COPD Sister   . Fibromyalgia Sister   . Hypertension Sister   . Hyperlipidemia Sister   . Asthma Sister   . Thyroid disease Sister   . Breast cancer Other   . Cancer Neg Hx     ADVANCED DIRECTIVES (Y/N):  N  HEALTH MAINTENANCE: Social History   Tobacco Use  . Smoking status: Never Smoker  . Smokeless tobacco: Never Used  Vaping Use  . Vaping Use: Never used  Substance Use Topics  . Alcohol use: No  .  Drug use: No     Colonoscopy:  PAP:  Bone density:  Lipid panel:  Allergies  Allergen Reactions  . Celecoxib Anxiety and Itching    Current Outpatient Medications  Medication Sig Dispense Refill  . acetaminophen (TYLENOL 8 HOUR) 650 MG CR tablet Take 650 mg by mouth every 8 (eight) hours as needed for pain.    Marland Kitchen aspirin 81 MG EC tablet Take 81 mg by mouth daily.     Marland Kitchen azelastine (ASTELIN) 0.1 % nasal spray Place 2 sprays into both nostrils daily. Use in each nostril as directed 30 mL 12  . Calcium Carbonate-Vit  D-Min (CALCIUM 1200 PO) Take 600 mg by mouth daily.     Rolena Infante [place] 450 MG CAPS Take 450 mg by mouth daily as needed.     . COD LIVER OIL/VITAMINS A & D PO Take 1 tablet by mouth daily.    . Cyanocobalamin (VITAMIN B-12) 1000 MCG SUBL Place 1,000 mcg under the tongue daily.     Marland Kitchen erythromycin ophthalmic ointment Apply small amount to left eye at bedtime    . fluticasone (FLONASE) 50 MCG/ACT nasal spray Place 2 sprays into both nostrils daily. 16 g 3  . Homeopathic Products ([employer] LEG CRAMPS PAIN RELIEF PO) Take 1 tablet by mouth daily as needed (leg cramps).     . magnesium oxide (MAG-OX) 400 MG tablet Take 400 mg by mouth daily.    . meloxicam (MOBIC) 7.5 MG tablet     . OVER THE COUNTER MEDICATION Take 1,000 mg by mouth daily. **Black Cumin Seed Oil**    . OVER THE COUNTER MEDICATION Take 1 capsule by mouth as directed. [place]     . OVER THE COUNTER MEDICATION Take 1 Dose by mouth as directed. Sugar Metabolism Support  (Cinnamon 2029m plus Chromium)     . Potassium Gluconate 2.5 MEQ TABS Take 1 tablet by mouth daily.    . pravastatin (PRAVACHOL) 40 MG tablet Take 1 tablet by mouth at bedtime.     . pseudoephedrine (SUDAFED) 30 MG tablet Take 30 mg by mouth daily.    .Marland KitchenREFRESH CELLUVISC 1 % GEL     . TURMERIC CURCUMIN PO Take 1 capsule by mouth 3 (three) times a week.     No current facility-administered medications for this visit.    OBJECTIVE: Vitals:   12/13/19 1043  BP: 123/79  Pulse: 65  Resp: 18  Temp: (!) 96.4 F (35.8 C)     Body mass index is 21.67 kg/m.    ECOG FS:0 - Asymptomatic  General: Well-developed, well-nourished, no acute distress. Eyes: Pink conjunctiva, anicteric sclera. HEENT: Normocephalic, moist mucous membranes. Lungs: No audible wheezing or coughing. Heart: Regular rate and rhythm. Abdomen: Soft, nontender, no obvious distention. Musculoskeletal: No edema, cyanosis, or clubbing. Neuro: Alert, answering all questions  appropriately. Cranial nerves grossly intact. Skin: No rashes or petechiae noted. Psych: Normal affect.   LAB RESULTS:  Lab Results  Component Value Date   NA 143 09/15/2019   K 4.2 09/15/2019   CL 108 09/15/2019   CO2 30 09/15/2019   GLUCOSE 86 09/15/2019   BUN 14 09/15/2019   CREATININE 0.54 09/15/2019   CALCIUM 9.2 09/15/2019   PROT 6.2 09/15/2019   ALBUMIN 3.9 12/17/2016   AST 15 09/15/2019   ALT 10 09/15/2019   ALKPHOS 79 12/17/2016   BILITOT 0.6 09/15/2019   GFRNONAA 96 09/15/2019   GFRAA 112 09/15/2019    Lab Results  Component Value Date   WBC 3.4 (L) 12/13/2019   NEUTROABS 1.5 (L) 12/13/2019   HGB 12.7 12/13/2019   HCT 38.4 12/13/2019   MCV 86.1 12/13/2019   PLT 199 12/13/2019     STUDIES: No results found.  ASSESSMENT: Leukopenia.  PLAN:   1.  Leukopenia: Chronic and unchanged.  Patient's white blood cell count continues to be mildly decreased at 3.4 today.  Previously, she was noted to have positive neutrophil antibodies.  Repeat testing is pending at time of dictation. The remainder of her blood work including peripheral blood flow cytometry was either negative or within normal limits.  No further intervention is needed.  Patient does not require bone marrow biopsy.  Return to clinic in 6 months with repeat laboratory work and further evaluation.    I spent a total of 20 minutes reviewing chart data, face-to-face evaluation with the patient, counseling and coordination of care as detailed above.  Patient expressed understanding and was in agreement with this plan. She also understands that She can call clinic at any time with any questions, concerns, or complaints.    Lloyd Huger, MD   12/14/2019 6:15 AM

## 2019-12-13 ENCOUNTER — Encounter: Payer: Self-pay | Admitting: Oncology

## 2019-12-13 ENCOUNTER — Other Ambulatory Visit: Payer: Self-pay

## 2019-12-13 ENCOUNTER — Inpatient Hospital Stay: Payer: Medicare HMO | Attending: Oncology

## 2019-12-13 ENCOUNTER — Inpatient Hospital Stay (HOSPITAL_BASED_OUTPATIENT_CLINIC_OR_DEPARTMENT_OTHER): Payer: Medicare HMO | Admitting: Oncology

## 2019-12-13 VITALS — BP 123/79 | HR 65 | Temp 96.4°F | Resp 18 | Wt 142.5 lb

## 2019-12-13 DIAGNOSIS — Z8249 Family history of ischemic heart disease and other diseases of the circulatory system: Secondary | ICD-10-CM | POA: Insufficient documentation

## 2019-12-13 DIAGNOSIS — D72819 Decreased white blood cell count, unspecified: Secondary | ICD-10-CM | POA: Insufficient documentation

## 2019-12-13 DIAGNOSIS — D709 Neutropenia, unspecified: Secondary | ICD-10-CM

## 2019-12-13 DIAGNOSIS — E7800 Pure hypercholesterolemia, unspecified: Secondary | ICD-10-CM | POA: Diagnosis not present

## 2019-12-13 DIAGNOSIS — Z79899 Other long term (current) drug therapy: Secondary | ICD-10-CM | POA: Insufficient documentation

## 2019-12-13 DIAGNOSIS — Z90710 Acquired absence of both cervix and uterus: Secondary | ICD-10-CM | POA: Insufficient documentation

## 2019-12-13 DIAGNOSIS — E119 Type 2 diabetes mellitus without complications: Secondary | ICD-10-CM | POA: Diagnosis not present

## 2019-12-13 DIAGNOSIS — Z9079 Acquired absence of other genital organ(s): Secondary | ICD-10-CM | POA: Diagnosis not present

## 2019-12-13 DIAGNOSIS — Z833 Family history of diabetes mellitus: Secondary | ICD-10-CM | POA: Diagnosis not present

## 2019-12-13 DIAGNOSIS — Z8349 Family history of other endocrine, nutritional and metabolic diseases: Secondary | ICD-10-CM | POA: Diagnosis not present

## 2019-12-13 DIAGNOSIS — Z90722 Acquired absence of ovaries, bilateral: Secondary | ICD-10-CM | POA: Insufficient documentation

## 2019-12-13 DIAGNOSIS — Z7982 Long term (current) use of aspirin: Secondary | ICD-10-CM | POA: Insufficient documentation

## 2019-12-13 DIAGNOSIS — Z8261 Family history of arthritis: Secondary | ICD-10-CM | POA: Diagnosis not present

## 2019-12-13 DIAGNOSIS — Z803 Family history of malignant neoplasm of breast: Secondary | ICD-10-CM | POA: Diagnosis not present

## 2019-12-13 LAB — CBC WITH DIFFERENTIAL/PLATELET
Abs Immature Granulocytes: 0.01 10*3/uL (ref 0.00–0.07)
Basophils Absolute: 0.1 10*3/uL (ref 0.0–0.1)
Basophils Relative: 2 %
Eosinophils Absolute: 0 10*3/uL (ref 0.0–0.5)
Eosinophils Relative: 0 %
HCT: 38.4 % (ref 36.0–46.0)
Hemoglobin: 12.7 g/dL (ref 12.0–15.0)
Immature Granulocytes: 0 %
Lymphocytes Relative: 39 %
Lymphs Abs: 1.3 10*3/uL (ref 0.7–4.0)
MCH: 28.5 pg (ref 26.0–34.0)
MCHC: 33.1 g/dL (ref 30.0–36.0)
MCV: 86.1 fL (ref 80.0–100.0)
Monocytes Absolute: 0.5 10*3/uL (ref 0.1–1.0)
Monocytes Relative: 14 %
Neutro Abs: 1.5 10*3/uL — ABNORMAL LOW (ref 1.7–7.7)
Neutrophils Relative %: 45 %
Platelets: 199 10*3/uL (ref 150–400)
RBC: 4.46 MIL/uL (ref 3.87–5.11)
RDW: 13.8 % (ref 11.5–15.5)
WBC: 3.4 10*3/uL — ABNORMAL LOW (ref 4.0–10.5)
nRBC: 0 % (ref 0.0–0.2)

## 2019-12-13 NOTE — Progress Notes (Signed)
Pt in for follow up, denies any concerns today. 

## 2019-12-22 LAB — MISC LABCORP TEST (SEND OUT): Labcorp test code: 520090

## 2020-01-11 ENCOUNTER — Other Ambulatory Visit: Payer: Self-pay

## 2020-01-12 ENCOUNTER — Ambulatory Visit: Payer: Medicare HMO | Admitting: Gastroenterology

## 2020-01-17 ENCOUNTER — Ambulatory Visit: Payer: Medicare HMO | Admitting: Family Medicine

## 2020-02-08 ENCOUNTER — Other Ambulatory Visit: Payer: Self-pay | Admitting: Family Medicine

## 2020-02-08 DIAGNOSIS — H6983 Other specified disorders of Eustachian tube, bilateral: Secondary | ICD-10-CM

## 2020-02-08 DIAGNOSIS — J324 Chronic pansinusitis: Secondary | ICD-10-CM

## 2020-02-09 ENCOUNTER — Other Ambulatory Visit: Payer: Self-pay | Admitting: Family Medicine

## 2020-02-09 DIAGNOSIS — J324 Chronic pansinusitis: Secondary | ICD-10-CM

## 2020-02-09 DIAGNOSIS — H6983 Other specified disorders of Eustachian tube, bilateral: Secondary | ICD-10-CM

## 2020-02-09 DIAGNOSIS — E782 Mixed hyperlipidemia: Secondary | ICD-10-CM

## 2020-02-23 ENCOUNTER — Ambulatory Visit
Admission: RE | Admit: 2020-02-23 | Discharge: 2020-02-23 | Disposition: A | Discharge status: Home / Self Care | Payer: Medicare HMO | Source: Ambulatory Visit | Attending: Family Medicine | Admitting: Family Medicine

## 2020-02-23 ENCOUNTER — Other Ambulatory Visit: Payer: Self-pay

## 2020-02-23 DIAGNOSIS — R29890 Loss of height: Secondary | ICD-10-CM | POA: Diagnosis not present

## 2020-02-23 DIAGNOSIS — Z90722 Acquired absence of ovaries, bilateral: Secondary | ICD-10-CM | POA: Diagnosis not present

## 2020-02-23 DIAGNOSIS — Z8739 Personal history of other diseases of the musculoskeletal system and connective tissue: Secondary | ICD-10-CM | POA: Diagnosis not present

## 2020-02-23 DIAGNOSIS — Z780 Asymptomatic menopausal state: Secondary | ICD-10-CM | POA: Insufficient documentation

## 2020-02-23 DIAGNOSIS — Z1231 Encounter for screening mammogram for malignant neoplasm of breast: Secondary | ICD-10-CM | POA: Diagnosis not present

## 2020-03-08 DIAGNOSIS — H16223 Keratoconjunctivitis sicca, not specified as Sjogren's, bilateral: Secondary | ICD-10-CM | POA: Diagnosis not present

## 2020-03-09 ENCOUNTER — Other Ambulatory Visit: Payer: Self-pay | Admitting: Family Medicine

## 2020-03-09 DIAGNOSIS — J324 Chronic pansinusitis: Secondary | ICD-10-CM

## 2020-03-09 DIAGNOSIS — H6983 Other specified disorders of Eustachian tube, bilateral: Secondary | ICD-10-CM

## 2020-03-17 ENCOUNTER — Encounter: Payer: Self-pay | Admitting: Emergency Medicine

## 2020-03-17 ENCOUNTER — Ambulatory Visit
Admission: EM | Admit: 2020-03-17 | Discharge: 2020-03-17 | Disposition: A | Discharge status: Home / Self Care | Payer: Medicare HMO | Attending: Emergency Medicine | Admitting: Emergency Medicine

## 2020-03-17 ENCOUNTER — Ambulatory Visit (INDEPENDENT_AMBULATORY_CARE_PROVIDER_SITE_OTHER): Payer: Medicare HMO

## 2020-03-17 ENCOUNTER — Other Ambulatory Visit: Payer: Self-pay

## 2020-03-17 DIAGNOSIS — M79641 Pain in right hand: Secondary | ICD-10-CM | POA: Diagnosis not present

## 2020-03-17 DIAGNOSIS — M25541 Pain in joints of right hand: Secondary | ICD-10-CM | POA: Diagnosis not present

## 2020-03-17 DIAGNOSIS — M19041 Primary osteoarthritis, right hand: Secondary | ICD-10-CM | POA: Diagnosis not present

## 2020-03-17 DIAGNOSIS — M1990 Unspecified osteoarthritis, unspecified site: Secondary | ICD-10-CM

## 2020-03-17 MED ORDER — NAPROXEN 250 MG PO TABS: 250.0000 mg | ORAL_TABLET | Freq: Two times a day (BID) | ORAL | 0 refills | Status: DC

## 2020-03-17 NOTE — Discharge Instructions (Addendum)
Your xray overall looks well, there is arthritis visualized, particularly to your thumb and pointer finger.  I feel that your pain is likely related to arthritis, as well as even some bruising.  Ice and elevation to help with and swelling to the area.  Light and regular activity as tolerated but try to avoid over use.  Naproxen twice a day for pain, take with food.  Please follow up with your primary care provider as needed if persistent or worsening.

## 2020-03-17 NOTE — ED Provider Notes (Signed)
[facility]    CSN: [id number] Arrival date & time: 03/17/20  1713      History   Chief Complaint Chief Complaint  Patient presents with   Hand Pain    right    HPI Ebony [name] is a 69 y.o. female.   [name] presents with complaints of pain to right hand. This has been worsening over the past 4 months. No specific known injury. 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 This does seem to exacerbate the pain. Soreness particularly to right hand middle MCP joint. Has taken Tylenol which has provided minimal relief. History of arthritis.   ROS per HPI, negative if not otherwise mentioned.       Past Medical History:  Diagnosis Date   Allergy    Anemia    Arthritis    knees, hands   Borderline diabetes mellitus    Complication of anesthesia    slow to wake   Dry eye syndrome of both eyes    Hypercholesterolemia    Prehypertension 12/17/2016   Wears dentures    full upper and lower    Patient Active Problem List   Diagnosis Date Noted   Palpitation 09/29/2019   Anemia, unspecified 09/25/2019   Chest pain 02/25/2019   B12 deficiency 12/30/2018   Hyperlipidemia 07/01/2018   Prediabetes 07/01/2018   Neutropenia, unspecified type (Ebony [name]) 12/18/2016   Adjustment disorder with mixed anxiety and depressed mood 10/11/2015   Heartburn 10/11/2015   Arthritis of knee, degenerative 07/18/2015    Past Surgical History:  Procedure Laterality Date   ABDOMINAL HYSTERECTOMY     complete   COLONOSCOPY WITH PROPOFOL N/A 05/09/2017   Procedure: COLONOSCOPY WITH PROPOFOL;  Surgeon: Lucilla Lame, MD;  Location: [facility];  Service: Endoscopy;  Laterality: N/A;   EYE SURGERY  08/04/2017   KNEE SURGERY Bilateral     OB History   No obstetric history on file.      Home Medications    Prior to Admission medications   Medication Sig Start Date  End Date Taking? Authorizing Provider  Calcium Carbonate-Vit D-Min (CALCIUM 1200 PO) Take 600 mg by mouth daily.    Yes [provider]  COD LIVER OIL/VITAMINS A & D PO Take 1 tablet by mouth daily.   Yes [provider]  Cyanocobalamin (VITAMIN B-12) 1000 MCG SUBL Place 1,000 mcg under the tongue daily.    Yes [provider]  fluticasone (FLONASE) 50 MCG/ACT nasal spray SPRAY 2 SPRAYS INTO EACH NOSTRIL EVERY DAY 02/08/20  Yes Delsa Grana, PA-C  Homeopathic Products ([employer] LEG CRAMPS PAIN RELIEF PO) Take 1 tablet by mouth daily as needed (leg cramps).    Yes [provider]  magnesium oxide (MAG-OX) 400 MG tablet Take 400 mg by mouth daily.   Yes [provider]  pantoprazole (PROTONIX) 20 MG tablet  09/24/19  Yes [provider]  Potassium Gluconate 2.5 MEQ TABS Take 1 tablet by mouth daily.   Yes [provider]  TURMERIC CURCUMIN PO Take 1 capsule by mouth 3 (three) times a week.   Yes [provider]  acetaminophen (TYLENOL 8 HOUR) 650 MG CR tablet Take 650 mg by mouth every 8 (eight) hours as needed for pain.    [provider]  aspirin 81 MG EC tablet Take 81 mg by mouth daily.     [provider]  azelastine (  ASTELIN) 0.1 % nasal spray Place 2 sprays into both nostrils daily. Use in each nostril as directed 11/01/19   Delsa Grana, PA-C  Cascara Sagrada 450 MG CAPS Take 450 mg by mouth daily as needed.     [provider]  erythromycin ophthalmic ointment Apply small amount to left eye at bedtime 06/15/19   [provider]  meloxicam (MOBIC) 7.5 MG tablet  09/20/19   [provider]  naproxen (NAPROSYN) 250 MG tablet Take 1 tablet (250 mg total) by mouth 2 (two) times daily with a meal. 03/17/20   Tashae Inda, Malachy Moan, NP  OVER THE COUNTER MEDICATION Take 1,000 mg by mouth daily. **Black Cumin Seed Oil**    [provider]  OVER THE COUNTER MEDICATION Take 1 capsule by mouth as  directed. [place]     [provider]  OVER THE COUNTER MEDICATION Take 1 Dose by mouth as directed. Sugar Metabolism Support  (Cinnamon 2000mg  plus Chromium)     [provider]  pravastatin (PRAVACHOL) 40 MG tablet Take 1 tablet by mouth at bedtime.  09/29/19 09/28/20  [provider]  pseudoephedrine (SUDAFED) 30 MG tablet Take 30 mg by mouth daily.    [provider]  REFRESH CELLUVISC 1 % GEL  07/19/19   [provider]  rosuvastatin (CRESTOR) 20 MG tablet  12/08/19   [provider]  pantoprazole (PROTONIX) 20 MG tablet TAKE 1 TABLET (20 MG TOTAL) BY MOUTH EVERY MORNING. ONE HOUR BEFORE BREAKFAST 06/08/19   Delsa Grana, PA-C    Family History Family History  Problem Relation Age of Onset   Hypertension Mother    Stroke Mother        4 mini strokes   Thyroid disease Mother    Kidney failure Father    Diabetes Father    Hypertension Father    Kidney disease Father    Emphysema Maternal Grandmother    Diabetes Maternal Grandmother    Hypertension Maternal Grandmother    Diabetes Maternal Grandfather    Alcohol abuse Maternal Grandfather    Diabetes Paternal Grandmother    Heart attack Paternal Grandmother    Emphysema Paternal Grandfather    Diabetes Paternal Grandfather    Heart disease Paternal Grandfather    Thyroid disease Sister    Vitamin D deficiency Sister    Arthritis Sister    COPD Sister    Fibromyalgia Sister    Hypertension Sister    Hyperlipidemia Sister    Asthma Sister    Thyroid disease Sister    Breast cancer Other    Cancer Neg Hx     Social History Social History   Tobacco Use   Smoking status: Never Smoker   Smokeless tobacco: Never Used  Scientific laboratory technician Use: Never used  Substance Use Topics   Alcohol use: No   Drug use: No     Allergies   Celecoxib   Review of Systems Review of Systems   Physical Exam Triage Vital  Signs ED Triage Vitals  Enc Vitals Group     BP 03/17/20 1802 (!) 123/107     Pulse Rate 03/17/20 1802 60     Resp 03/17/20 1802 14     Temp 03/17/20 1802 98.4 F (36.9 C)     Temp Source 03/17/20 1802 Oral     SpO2 03/17/20 1802 97 %     Weight 03/17/20 1758 137 lb (62.1 kg)     Height 03/17/20 1758  5' 7.5" (1.715 m)     Head Circumference --      Peak Flow --      Pain Score 03/17/20 1757 7     Pain Loc --      Pain Edu? --      Excl. in [place]? --    No data found.  Updated Vital Signs BP (!) 123/107 (BP Location: Left Arm)    Pulse 60    Temp 98.4 F (36.9 C) (Oral)    Resp 14    Ht 5' 7.5" (1.715 m)    Wt 137 lb (62.1 kg)    SpO2 97%    BMI 21.14 kg/m   Visual Acuity Right Eye Distance:   Left Eye Distance:   Bilateral Distance:    Right Eye Near:   Left Eye Near:    Bilateral Near:     Physical Exam Constitutional:      General: She is not in acute distress.    Appearance: She is well-developed.  Cardiovascular:     Rate and Rhythm: Normal rate.  Pulmonary:     Effort: Pulmonary effort is normal.  Musculoskeletal:     Right hand: Tenderness and bony tenderness present.     Comments: Tenderness to palmar aspect of right hand middle MCP joint without redness or warmth, no swelling; full ROM no fingers; cap refill < 2 seconds    Skin:    General: Skin is warm and dry.  Neurological:     Mental Status: She is alert and oriented to person, place, and time.      UC Treatments / Results  Labs (all labs ordered are listed, but only abnormal results are displayed) Labs Reviewed - No data to display  EKG   Radiology DG Hand Complete Right  Result Date: 03/17/2020 CLINICAL DATA:  69 year old female with middle MCP joint pain. Arthritis. No known injury. EXAM: RIGHT HAND - COMPLETE 3+ VIEW COMPARISON:  None. FINDINGS: Mild generalized osteopenia. Distal radius and ulna appear intact. Carpal bone alignment and joint spaces maintained. No acute metacarpal  fracture identified. There are possibly healed remote fractures of both the 3rd and 5th metacarpals. Thumb MCP joint space loss with subchondral sclerosis and osteophytosis. Other MCP joint spaces relatively maintained. Similar osteoarthritis at the 1st IP and 2nd DIP. Phalanges intact. No acute osseous abnormality identified. IMPRESSION: 1. 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 2. No acute osseous abnormality identified. Electronically Signed   By: Genevie Ann M.D.   On: 03/17/2020 18:57    Procedures Procedures (including critical care time)  Medications Ordered in UC Medications - No data to display  Initial Impression / Assessment and Plan / UC Course  I have reviewed the triage vital signs and the nursing notes.  Pertinent labs & imaging results that were available during my care of the patient were reviewed by me and considered in my medical decision making (see chart for details).     Arthritis likely, but further I suspect there is some contusion to the area from tambourine use, as described by patient. Pain management and treatment plan discussed. Follow up recommendations provided. Patient verbalized understanding and agreeable to plan.   Final Clinical Impressions(s) / UC Diagnoses   Final diagnoses:  Right hand pain  Arthritis     Discharge Instructions     Your xray overall looks well, there is arthritis visualized, particularly to your thumb and pointer finger.  I feel that your pain is likely related  to arthritis, as well as even some bruising.  Ice and elevation to help with and swelling to the area.  Light and regular activity as tolerated but try to avoid over use.  Naproxen twice a day for pain, take with food.  Please follow up with your primary care provider as needed if persistent or worsening.     ED Prescriptions    Medication Sig Dispense Auth. Provider   naproxen (NAPROSYN) 250 MG tablet Take 1 tablet (250 mg total) by mouth 2 (two) times  daily with a meal. 30 tablet Zigmund Gottron, NP     PDMP not reviewed this encounter.   Zigmund Gottron, NP 03/17/20 1922

## 2020-03-17 NOTE — ED Triage Notes (Signed)
Patient c/o pain in the palm of her right hand that started 3-4 months ago.  Patient denies injury or fall.

## 2020-03-20 ENCOUNTER — Other Ambulatory Visit: Payer: Self-pay | Admitting: Family Medicine

## 2020-03-22 DIAGNOSIS — E7800 Pure hypercholesterolemia, unspecified: Secondary | ICD-10-CM | POA: Diagnosis not present

## 2020-03-22 DIAGNOSIS — E782 Mixed hyperlipidemia: Secondary | ICD-10-CM | POA: Diagnosis not present

## 2020-03-22 DIAGNOSIS — R9431 Abnormal electrocardiogram [ECG] [EKG]: Secondary | ICD-10-CM | POA: Diagnosis not present

## 2020-03-22 DIAGNOSIS — R002 Palpitations: Secondary | ICD-10-CM | POA: Diagnosis not present

## 2020-03-23 ENCOUNTER — Ambulatory Visit: Payer: Medicare HMO | Admitting: Gastroenterology

## 2020-03-23 ENCOUNTER — Encounter: Payer: Self-pay | Admitting: Gastroenterology

## 2020-03-29 DIAGNOSIS — M170 Bilateral primary osteoarthritis of knee: Secondary | ICD-10-CM | POA: Diagnosis not present

## 2020-04-03 ENCOUNTER — Ambulatory Visit: Payer: Medicare HMO | Admitting: Family Medicine

## 2020-05-02 ENCOUNTER — Other Ambulatory Visit: Payer: Self-pay

## 2020-05-02 ENCOUNTER — Ambulatory Visit: Payer: Medicare HMO | Admitting: Family Medicine

## 2020-05-02 ENCOUNTER — Encounter: Payer: Self-pay | Admitting: Family Medicine

## 2020-05-02 VITALS — BP 120/72 | HR 88 | Temp 98.7°F | Resp 16 | Ht 68.0 in | Wt 133.2 lb

## 2020-05-02 DIAGNOSIS — R002 Palpitations: Secondary | ICD-10-CM | POA: Diagnosis not present

## 2020-05-02 DIAGNOSIS — R7303 Prediabetes: Secondary | ICD-10-CM | POA: Diagnosis not present

## 2020-05-02 DIAGNOSIS — R634 Abnormal weight loss: Secondary | ICD-10-CM | POA: Diagnosis not present

## 2020-05-02 DIAGNOSIS — K219 Gastro-esophageal reflux disease without esophagitis: Secondary | ICD-10-CM | POA: Diagnosis not present

## 2020-05-02 DIAGNOSIS — D709 Neutropenia, unspecified: Secondary | ICD-10-CM | POA: Diagnosis not present

## 2020-05-02 DIAGNOSIS — E538 Deficiency of other specified B group vitamins: Secondary | ICD-10-CM | POA: Diagnosis not present

## 2020-05-02 DIAGNOSIS — Z9189 Other specified personal risk factors, not elsewhere classified: Secondary | ICD-10-CM

## 2020-05-02 DIAGNOSIS — E782 Mixed hyperlipidemia: Secondary | ICD-10-CM | POA: Diagnosis not present

## 2020-05-02 MED ORDER — ACETAMINOPHEN 500 MG PO TABS
500.0000 mg | ORAL_TABLET | Freq: Three times a day (TID) | ORAL | 0 refills | Status: AC | PRN
Start: 2020-05-02 — End: ?

## 2020-05-02 MED ORDER — PANTOPRAZOLE SODIUM 40 MG PO TBEC: 40.0000 mg | DELAYED_RELEASE_TABLET | Freq: Every day | ORAL | 1 refills | Status: DC

## 2020-05-02 NOTE — Progress Notes (Signed)
Name: Ebony [name]   MRN: [id number]    DOB: 12-Aug-1950   Date:05/02/2020       Progress Note  Chief Complaint  Patient presents with  . Gastroesophageal Reflux  . Hyperlipidemia     Subjective:   Ebony [name] is a 69 y.o. female, presents to clinic for routine follow-up  CP and palpitations associated with stress, no exertional sx, she has seen cardiology -consulted with Dr. Nehemiah Massed at [facility] cardiology She states she can exercise or do anything physical without any symptoms but she seems to have increased heart rate and palpitations when she is stressed  HLD - med changed, crestor 10 mg now, was feeling confused with higher dose of Crestor and with prior pravastatin 40 mg, this has seemed to improved somewhat since decreasing to Crestor 10 -this is changed by cardiology nurse practitioner at her recent office visit, this was reviewed through care everywhere and updated on the med list  GERD/epigastric to central chest pain and indigestion -she has been on Protonix 20 mg in the past and increased to 40 mg for several months after ER visit earlier this year.  She notes that her symptoms have not resolved completely and she does have a follow-up soon with gastroenterology.  Patient does not try to be very holistic and healthy and she is on several supplements for gas, constipation and multiple herbs and supplements, she has even been trying a vinegar supplement and several things which she states she believes has been irritating her stomach.  She has also been on naproxen or Aleve in addition to Mobic for a while as well.  She stopped taking Aleve and instead started taking naproxen but she has also been getting prescribed meloxicam from specialist and taking them together in addition to Tylenol She also takes a cinnamon supplement -to self manage family history of diabetes and she has prediabetes Multiple other over-the-counter medications and supplements such as black cumin seed oil,  potassium gluconate, vision shield eye supplements, Sudafed, turmeric, cod liver oil, Cassara sagrada, calcium and vitamin D, magnesium oxide supplement and a oral B12 supplement -all of these self initiated  She does note continued decreased appetite and indigestion, feels full very early and some gradual weight loss Down about 12 lbs since last year Wt Readings from Last 10 Encounters:  05/02/20 133 lb 3.2 oz (60.4 kg)  03/17/20 137 lb (62.1 kg)  12/13/19 142 lb 8 oz (64.6 kg)  11/01/19 144 lb 3.2 oz (65.4 kg)  09/27/19 147 lb 12.8 oz (67 kg)  09/15/19 147 lb 1.6 oz (66.7 kg)  06/14/19 145 lb (65.8 kg)  05/04/19 145 lb (65.8 kg)  03/03/19 143 lb 6.4 oz (65 kg)  02/26/19 140 lb 6.4 oz (63.7 kg)   BMI Readings from Last 5 Encounters:  05/02/20 20.25 kg/m  03/17/20 21.14 kg/m  12/13/19 21.67 kg/m  11/01/19 21.93 kg/m  09/27/19 22.47 kg/m      Current Outpatient Medications:  .  acetaminophen (TYLENOL) 650 MG CR tablet, Take 650 mg by mouth every 8 (eight) hours as needed for pain., Disp: , Rfl:  .  Calcium Carbonate-Vit D-Min (CALCIUM 1200 PO), Take 600 mg by mouth daily. , Disp: , Rfl:  .  Cascara Sagrada 450 MG CAPS, Take 450 mg by mouth daily as needed. , Disp: , Rfl:  .  COD LIVER OIL/VITAMINS A & D PO, Take 1 tablet by mouth daily., Disp: , Rfl:  .  Cyanocobalamin (VITAMIN B-12) 1000 MCG  SUBL, Place 1,000 mcg under the tongue daily. , Disp: , Rfl:  .  erythromycin ophthalmic ointment, Apply small amount to left eye at bedtime, Disp: , Rfl:  .  fluticasone (FLONASE) 50 MCG/ACT nasal spray, SPRAY 2 SPRAYS INTO EACH NOSTRIL EVERY DAY, Disp: 48 mL, Rfl: 1 .  Homeopathic Products ([employer] LEG CRAMPS PAIN RELIEF PO), Take 1 tablet by mouth daily as needed (leg cramps). , Disp: , Rfl:  .  magnesium oxide (MAG-OX) 400 MG tablet, Take 400 mg by mouth daily., Disp: , Rfl:  .  OVER THE COUNTER MEDICATION, Take 1,000 mg by mouth daily. **Black Cumin Seed Oil**, Disp: , Rfl:  .  OVER THE  COUNTER MEDICATION, Take 1 capsule by mouth as directed. VisionShield Eye Health supplement, Disp: , Rfl:  .  Potassium Gluconate 2.5 MEQ TABS, Take 1 tablet by mouth daily., Disp: , Rfl:  .  pseudoephedrine (SUDAFED) 30 MG tablet, Take 30 mg by mouth daily., Disp: , Rfl:  .  REFRESH CELLUVISC 1 % GEL, , Disp: , Rfl:  .  rosuvastatin (CRESTOR) 10 MG tablet, Take 10 mg by mouth at bedtime., Disp: , Rfl:  .  TURMERIC CURCUMIN PO, Take 1 capsule by mouth 3 (three) times a week., Disp: , Rfl:  .  levocetirizine (XYZAL) 5 MG tablet, levocetirizine 5 mg tablet, Disp: , Rfl:  .  pantoprazole (PROTONIX) 40 MG tablet, Take 1 tablet (40 mg total) by mouth daily. For GERD/acid reflux, Disp: 90 tablet, Rfl: 1 .  RESTASIS 0.05 % ophthalmic emulsion, Instill 1 drop into both eyes twice a day, Disp: , Rfl:   Patient Active Problem List   Diagnosis Date Noted  . Palpitation 09/29/2019  . Anemia, unspecified 09/25/2019  . Chest pain 02/25/2019  . B12 deficiency 12/30/2018  . Hyperlipidemia 07/01/2018  . Prediabetes 07/01/2018  . Neutropenia, unspecified type ([place]) 12/18/2016  . Adjustment disorder with mixed anxiety and depressed mood 10/11/2015  . Heartburn 10/11/2015  . Arthritis of knee, degenerative 07/18/2015    Past Surgical History:  Procedure Laterality Date  . ABDOMINAL HYSTERECTOMY     complete  . COLONOSCOPY WITH PROPOFOL N/A 05/09/2017   Procedure: COLONOSCOPY WITH PROPOFOL;  Surgeon: Lucilla Lame, MD;  Location: [place];  Service: Endoscopy;  Laterality: N/A;  . EYE SURGERY  08/04/2017  . KNEE SURGERY Bilateral     Family History  Problem Relation Age of Onset  . Hypertension Mother   . Stroke Mother        4 mini strokes  . Thyroid disease Mother   . Kidney failure Father   . Diabetes Father   . Hypertension Father   . Kidney disease Father   . Emphysema Maternal Grandmother   . Diabetes Maternal Grandmother   . Hypertension Maternal Grandmother   . Diabetes  Maternal Grandfather   . Alcohol abuse Maternal Grandfather   . Diabetes Paternal Grandmother   . Heart attack Paternal Grandmother   . Emphysema Paternal Grandfather   . Diabetes Paternal Grandfather   . Heart disease Paternal Grandfather   . Thyroid disease Sister   . Vitamin D deficiency Sister   . Arthritis Sister   . COPD Sister   . Fibromyalgia Sister   . Hypertension Sister   . Hyperlipidemia Sister   . Asthma Sister   . Thyroid disease Sister   . Breast cancer Other   . Cancer Neg Hx     Social History   Tobacco Use  . Smoking status:  Never Smoker  . Smokeless tobacco: Never Used  Vaping Use  . Vaping Use: Never used  Substance Use Topics  . Alcohol use: No  . Drug use: No     Allergies  Allergen Reactions  . Celecoxib Anxiety and Itching    Health Maintenance  Topic Date Due  . INFLUENZA VACCINE  08/03/2020 (Originally 12/05/2019)  . PNA vac Low Risk Adult (1 of 2 - PCV13) 09/14/2020 (Originally 09/19/2014)  . TETANUS/TDAP  09/25/2022 (Originally 09/18/1968)  . MAMMOGRAM  02/22/2021  . COLONOSCOPY (Pts 45-55yrs Insurance coverage will need to be confirmed)  05/10/2027  . DEXA SCAN  Completed  . COVID-19 Vaccine  Completed  . Hepatitis C Screening  Completed    Chart Review Today: I personally reviewed active problem list, medication list, allergies, family history, social history, health maintenance, notes from last encounter, lab results, imaging with the patient/caregiver today.   Review of Systems  10 Systems reviewed and are negative for acute change except as noted in the HPI.  Objective:   Vitals:   05/02/20 1014  BP: 120/72  Pulse: 88  Resp: 16  Temp: 98.7 F (37.1 C)  TempSrc: Oral  SpO2: 98%  Weight: 133 lb 3.2 oz (60.4 kg)  Height: 5\' 8"  (1.727 m)    Body mass index is 20.25 kg/m.  Physical Exam Vitals and nursing note reviewed.  Constitutional:      General: She is not in acute distress.    Appearance: Normal appearance.  She is well-developed. She is not ill-appearing, toxic-appearing or diaphoretic.     Interventions: Face mask in place.     Comments: Appears slightly thin, but not cachectic, generally well-appearing pleasant and alert  HENT:     Head: Normocephalic and atraumatic.     Right Ear: External ear normal.     Left Ear: External ear normal.  Eyes:     General: Lids are normal. No scleral icterus.       Right eye: No discharge.        Left eye: No discharge.     Conjunctiva/sclera: Conjunctivae normal.  Neck:     Trachea: Phonation normal. No tracheal deviation.  Cardiovascular:     Rate and Rhythm: Normal rate and regular rhythm.     Pulses: Normal pulses.          Radial pulses are 2+ on the right side and 2+ on the left side.       Posterior tibial pulses are 2+ on the right side and 2+ on the left side.     Heart sounds: Normal heart sounds. No murmur heard. No friction rub. No gallop.   Pulmonary:     Effort: Pulmonary effort is normal. No respiratory distress.     Breath sounds: Normal breath sounds. No stridor. No wheezing, rhonchi or rales.  Chest:     Chest wall: No tenderness.  Abdominal:     General: Bowel sounds are normal. There is no distension.     Palpations: Abdomen is soft.     Tenderness: There is no abdominal tenderness. There is no right CVA tenderness, left CVA tenderness, guarding or rebound.  Musculoskeletal:     Right lower leg: No edema.     Left lower leg: No edema.  Skin:    General: Skin is warm and dry.     Coloration: Skin is not jaundiced or pale.     Findings: No rash.  Neurological:     Mental Status: She is  alert.     Motor: No abnormal muscle tone.     Gait: Gait normal.  Psychiatric:        Mood and Affect: Mood normal.        Speech: Speech normal.        Behavior: Behavior normal.         Assessment & Plan:   1. Weight loss, unintentional Weight is down about 12 to 15 pounds relative to about a year ago Does seem to have  decreased appetite, early satiety, may still be secondary to GERD despite using PPI and increasing the dose Patient has been on multiple supplements even taking vinegar, cinnamon Reviewed with her diet and possible lifestyle triggers Continue Protonix 40 mg She is taking 2 NSAIDs -explained that she needs to stop these and only use Tylenol as needed, dosing reviewed with her Stop the several over-the-counter supplements May need to check thyroid?   Lab Results  Component Value Date   TSH 0.70 12/17/2016   I thought cardiology had check her thyroid because they were assessing her for palpitations but I reviewed her chart further this evening, our last TSH was a few years ago and I did not find any done by cardiology or at [facility] or through care everywhere We will try and add on TSH  - CBC with Differential/Platelet - COMPLETE METABOLIC PANEL WITH GFR  2. Gastroesophageal reflux disease, unspecified whether esophagitis present As noted above continue Protonix 40 mg, cut out all unnecessary and additional supplements and herbal treatments avoid anything acidic, caffeine, carbonation, decrease any citrus tomato-based products, avoid eating late at night before bedtime, can also try Pepcid twice daily and follow-up with gastroenterology - pantoprazole (PROTONIX) 40 MG tablet; Take 1 tablet (40 mg total) by mouth daily. For GERD/acid reflux  Dispense: 90 tablet; Refill: 1  3. Mixed hyperlipidemia Cardiology decreased her Crestor dose will recheck labs, she was previously poorly controlled with pravastatin 40 mg, when changed to Crestor 20 mg she had too many side effects - rosuvastatin (CRESTOR) 10 MG tablet; Take 10 mg by mouth at bedtime. - COMPLETE METABOLIC PANEL WITH GFR - Lipid panel  4. Neutropenia, unspecified type (HCC) - CBC with Differential/Platelet  5. Prediabetes Recheck labs - COMPLETE METABOLIC PANEL WITH GFR - Hemoglobin A1c  6. Palpitation No exertional symptoms,  worked up by cardiology, does seem related to stress and anxiety We reviewed that these are normal physical symptoms related to stressors or life, she noted increased palpitations for a few days when her mother fell and she was taking care of her but she has no palpitations, chest pain, shortness of breath near syncope, orthopnea, PND or lower extremity edema and is able to do exertional exercise and physical activity without any concerns or symptoms this is very reassuring - COMPLETE METABOLIC PANEL WITH GFR (Add on TSH)  7. Patient takes inappropriate over-the-counter medication Reviewed her med list with her thoroughly most of which was medicine she has decided to start over-the-counter, encouraged her to cut out almost all the extra supplements and to consult with the pharmacist regarding them or how they may affect her stomach or have other active ingredients, I did explain to her that most of them are not regulated so we do give medical recommendations on most of her supplements She can continue a B12 supplement, calcium and vitamin D and any moisturizing eyedrops or fish oil omega-3 fatty acid supplements but I would discontinue almost everything else.   Pt given written  instructions about avoiding NSAIDS, d/c naproxen, avoid Aleve Motrin ibuprofen and stop meloxicam for now manage any joint or muscle skeletal pain with Tylenol  Return in about 6 months (around 10/31/2020) for Routine follow-up.   Delsa Grana, PA-C 05/02/20 10:47 AM

## 2020-05-02 NOTE — Patient Instructions (Signed)
See the updated med list  Avoid all NSAIDs for now until you see the GI specialists (aleve, naproxen, mobic/meloxicam, ibuprofen, motrin) You can continue tylenol [phone] mg up to three times a day for pain  Continue pantoprazole 40 mg once daily until you see GI  Avoid food high in acid or carbination, avoid any supplements that could irritate your stomach  Food Choices for Gastroesophageal Reflux Disease, Adult When you have gastroesophageal reflux disease (GERD), the foods you eat and your eating habits are very important. Choosing the right foods can help ease your discomfort. Think about working with a nutrition specialist (dietitian) to help you make good choices. What are tips for following this plan?  Meals  Choose healthy foods that are low in fat, such as fruits, vegetables, whole grains, low-fat dairy products, and lean meat, fish, and poultry.  Eat small meals often instead of 3 large meals a day. Eat your meals slowly, and in a place where you are relaxed. Avoid bending over or lying down until 2-3 hours after eating.  Avoid eating meals 2-3 hours before bed.  Avoid drinking a lot of liquid with meals.  Cook foods using methods other than frying. Bake, grill, or broil food instead.  Avoid or limit: ? Chocolate. ? Peppermint or spearmint. ? Alcohol. ? Pepper. ? Black and decaffeinated coffee. ? Black and decaffeinated tea. ? Bubbly (carbonated) soft drinks. ? Caffeinated energy drinks and soft drinks.  Limit high-fat foods such as: ? Fatty meat or fried foods. ? Whole milk, cream, butter, or ice cream. ? Nuts and nut butters. ? Pastries, donuts, and sweets made with butter or shortening.  Avoid foods that cause symptoms. These foods may be different for everyone. Common foods that cause symptoms include: ? Tomatoes. ? Oranges, lemons, and limes. ? Peppers. ? Spicy food. ? Onions and garlic. ? Vinegar. Lifestyle  Maintain a healthy weight. Ask your  doctor what weight is healthy for you. If you need to lose weight, work with your doctor to do so safely.  Exercise for at least 30 minutes for 5 or more days each week, or as told by your doctor.  Wear loose-fitting clothes.  Do not smoke. If you need help quitting, ask your doctor.  Sleep with the head of your bed higher than your feet. Use a wedge under the mattress or blocks under the bed frame to raise the head of the bed. Summary  When you have gastroesophageal reflux disease (GERD), food and lifestyle choices are very important in easing your symptoms.  Eat small meals often instead of 3 large meals a day. Eat your meals slowly, and in a place where you are relaxed.  Limit high-fat foods such as fatty meat or fried foods.  Avoid bending over or lying down until 2-3 hours after eating.  Avoid peppermint and spearmint, caffeine, alcohol, and chocolate. This information is not intended to replace advice given to you by your health care provider. Make sure you discuss any questions you have with your health care provider. Document Revised: 08/13/2018 Document Reviewed: 05/28/2016 Elsevier Patient Education  2020 [employer].

## 2020-05-04 ENCOUNTER — Telehealth: Payer: Self-pay | Admitting: Family Medicine

## 2020-05-04 ENCOUNTER — Ambulatory Visit: Payer: Medicare HMO

## 2020-05-04 NOTE — Telephone Encounter (Signed)
Copied from CRM [id number]. Topic: Medicare AWV >> May 04, 2020  8:49 AM Claudette Laws R wrote: Reason for CRM:  12/30 no answer not able to leave a message to let patient know that Appointment will  need to change to Virtual or by Phone due to [facility] working [place] today - srs

## 2020-05-05 LAB — CBC WITH DIFFERENTIAL/PLATELET
Absolute Monocytes: 455 cells/uL (ref 200–950)
Basophils Absolute: 29 cells/uL (ref 0–200)
Basophils Relative: 0.7 %
Eosinophils Absolute: 0 cells/uL — ABNORMAL LOW (ref 15–500)
Eosinophils Relative: 0 %
HCT: 38.6 % (ref 35.0–45.0)
Hemoglobin: 12.6 g/dL (ref 11.7–15.5)
Lymphs Abs: 1287 cells/uL (ref 850–3900)
MCH: 28.3 pg (ref 27.0–33.0)
MCHC: 32.6 g/dL (ref 32.0–36.0)
MCV: 86.7 fL (ref 80.0–100.0)
MPV: 10.2 fL (ref 7.5–12.5)
Monocytes Relative: 11.1 %
Neutro Abs: 2329 cells/uL (ref 1500–7800)
Neutrophils Relative %: 56.8 %
Platelets: 208 10*3/uL (ref 140–400)
RBC: 4.45 10*6/uL (ref 3.80–5.10)
RDW: 13 % (ref 11.0–15.0)
Total Lymphocyte: 31.4 %
WBC: 4.1 10*3/uL (ref 3.8–10.8)

## 2020-05-05 LAB — TEST AUTHORIZATION

## 2020-05-05 LAB — COMPLETE METABOLIC PANEL WITH GFR
AG Ratio: 1.7 (calc) (ref 1.0–2.5)
ALT: 14 U/L (ref 6–29)
AST: 18 U/L (ref 10–35)
Albumin: 3.9 g/dL (ref 3.6–5.1)
Alkaline phosphatase (APISO): 65 U/L (ref 37–153)
BUN/Creatinine Ratio: 24 (calc) — ABNORMAL HIGH (ref 6–22)
BUN: 13 mg/dL (ref 7–25)
CO2: 29 mmol/L (ref 20–32)
Calcium: 9 mg/dL (ref 8.6–10.4)
Chloride: 106 mmol/L (ref 98–110)
Creat: 0.55 mg/dL — ABNORMAL LOW (ref 0.60–0.93)
GFR, Est African American: 110 mL/min/{1.73_m2} (ref >=60)
GFR, Est Non African American: 95 mL/min/{1.73_m2} (ref >=60)
Globulin: 2.3 g/dL (calc) (ref 1.9–3.7)
Glucose, Bld: 112 mg/dL — ABNORMAL HIGH (ref 65–99)
Potassium: 3.9 mmol/L (ref 3.5–5.3)
Sodium: 141 mmol/L (ref 135–146)
Total Bilirubin: 0.4 mg/dL (ref 0.2–1.2)
Total Protein: 6.2 g/dL (ref 6.1–8.1)

## 2020-05-05 LAB — HEMOGLOBIN A1C
Hgb A1c MFr Bld: 6.4 % of total Hgb — ABNORMAL HIGH (ref <5.7)
Mean Plasma Glucose: 137 mg/dL
eAG (mmol/L): 7.6 mmol/L

## 2020-05-05 LAB — LIPID PANEL
Cholesterol: 189 mg/dL (ref <200)
HDL: 82 mg/dL (ref >=50)
LDL Cholesterol (Calc): 94 mg/dL (calc)
Non-HDL Cholesterol (Calc): 107 mg/dL (calc) (ref <130)
Total CHOL/HDL Ratio: 2.3 (calc) (ref <5.0)
Triglycerides: 52 mg/dL (ref <150)

## 2020-05-05 LAB — TSH: TSH: 1.22 mIU/L (ref 0.40–4.50)

## 2020-05-20 ENCOUNTER — Other Ambulatory Visit: Payer: Self-pay | Admitting: Family Medicine

## 2020-05-20 DIAGNOSIS — K219 Gastro-esophageal reflux disease without esophagitis: Secondary | ICD-10-CM

## 2020-05-22 ENCOUNTER — Encounter: Payer: Self-pay | Admitting: Gastroenterology

## 2020-05-22 ENCOUNTER — Telehealth (INDEPENDENT_AMBULATORY_CARE_PROVIDER_SITE_OTHER): Payer: Medicare HMO | Admitting: Gastroenterology

## 2020-05-22 ENCOUNTER — Other Ambulatory Visit: Payer: Self-pay

## 2020-05-22 DIAGNOSIS — K219 Gastro-esophageal reflux disease without esophagitis: Secondary | ICD-10-CM | POA: Diagnosis not present

## 2020-05-22 DIAGNOSIS — R634 Abnormal weight loss: Secondary | ICD-10-CM

## 2020-05-22 DIAGNOSIS — R1310 Dysphagia, unspecified: Secondary | ICD-10-CM

## 2020-05-22 NOTE — Progress Notes (Signed)
Ebony [name]  [address]  [place]  [place], [place] [address]  Main: [phone]  Fax: [phone]   Gastroenterology Consultation  Referring Provider:     Delsa Grana, PA-C Primary Care Physician:  Delsa Grana, PA-C Reason for Consultation:    GERD        HPI:   Virtual Visit via Telephone Note  I connected with patient on 05/22/20 at  2:30 PM EST by telephone and verified that I am speaking with the correct person using two identifiers.   I discussed the limitations, risks, security and privacy concerns of performing an evaluation and management service by telephone and the availability of in person appointments. I also discussed with the patient that there may be a patient responsible charge related to this service. The patient expressed understanding and agreed to proceed.  Location of the patient: Home Location of provider: Home Participating persons: Patient and provider only   History of Present Illness: Chief Complaint  Patient presents with  . New Patient (Initial Visit)  . Gastroesophageal Reflux    Has acid reflex and burning      Ebony [name] is a 70 y.o. y/o female referred for consultation & management  by  Delsa Grana, PA-C.    Reflux:  Onset : 10-15 years  Symptoms: burp, regurgitation , heartburn. No chest pain , less when she stands up, some issues with swallowing food Recent weight gain: no weight loss Medications: famotidine , pantoprazole,  Narcotics or anticholinergics use : none PPI /H2 blockers or Antacid  use and timing :takes it with food  Dinner time : no fixed times  Prior EGD: no  Family history of esophageal cancer:no   Not on any blood thinners   Past Medical History:  Diagnosis Date  . Allergy   . Anemia   . Arthritis    knees, hands  . Borderline diabetes mellitus   . Complication of anesthesia    slow to wake  . Dry eye syndrome of both eyes   . Hypercholesterolemia   . Prehypertension 12/17/2016  . Wears  dentures    full upper and lower    Past Surgical History:  Procedure Laterality Date  . ABDOMINAL HYSTERECTOMY     complete  . COLONOSCOPY WITH PROPOFOL N/A 05/09/2017   Procedure: COLONOSCOPY WITH PROPOFOL;  Surgeon: Lucilla Lame, MD;  Location: [place];  Service: Endoscopy;  Laterality: N/A;  . EYE SURGERY  08/04/2017  . KNEE SURGERY Bilateral     Prior to Admission medications   Medication Sig Start Date End Date Taking? Authorizing Provider  acetaminophen (TYLENOL) 500 MG tablet Take 1-2 tablets (500-1,000 mg total) by mouth every 8 (eight) hours as needed for moderate pain. OTC 05/02/20  Yes Delsa Grana, PA-C  Calcium Carbonate-Vit D-Min (CALCIUM 1200 PO) Take 600 mg by mouth daily.    Yes [provider]  Cyanocobalamin (VITAMIN B-12) 1000 MCG SUBL Place 1,000 mcg under the tongue daily.    Yes [provider]  fluticasone (FLONASE) 50 MCG/ACT nasal spray SPRAY 2 SPRAYS INTO EACH NOSTRIL EVERY DAY 02/08/20  Yes Delsa Grana, PA-C  levocetirizine (XYZAL) 5 MG tablet levocetirizine 5 mg tablet   Yes [provider]  pantoprazole (PROTONIX) 40 MG tablet Take 1 tablet (40 mg total) by mouth daily. For GERD/acid reflux 05/02/20  Yes Delsa Grana, PA-C  REFRESH CELLUVISC 1 % GEL  07/19/19  Yes [provider]  RESTASIS 0.05 % ophthalmic emulsion Instill 1  drop into both eyes twice a day 03/08/20  Yes [provider]  rosuvastatin (CRESTOR) 10 MG tablet Take 10 mg by mouth at bedtime. 04/25/20  Yes [provider]    Family History  Problem Relation Age of Onset  . Hypertension Mother   . Stroke Mother        4 mini strokes  . Thyroid disease Mother   . Kidney failure Father   . Diabetes Father   . Hypertension Father   . Kidney disease Father   . Emphysema Maternal Grandmother   . Diabetes Maternal Grandmother   . Hypertension Maternal Grandmother   . Diabetes Maternal Grandfather   . Alcohol abuse Maternal  Grandfather   . Diabetes Paternal Grandmother   . Heart attack Paternal Grandmother   . Emphysema Paternal Grandfather   . Diabetes Paternal Grandfather   . Heart disease Paternal Grandfather   . Thyroid disease Sister   . Vitamin D deficiency Sister   . Arthritis Sister   . COPD Sister   . Fibromyalgia Sister   . Hypertension Sister   . Hyperlipidemia Sister   . Asthma Sister   . Thyroid disease Sister   . Breast cancer Other   . Cancer Neg Hx      Social History   Tobacco Use  . Smoking status: Never Smoker  . Smokeless tobacco: Never Used  Vaping Use  . Vaping Use: Never used  Substance Use Topics  . Alcohol use: No  . Drug use: No    Allergies as of 05/22/2020 - Review Complete 05/22/2020  Allergen Reaction Noted  . Celecoxib Anxiety and Itching 07/11/2015    Review of Systems:    All systems reviewed and negative except where noted in HPI.   Observations/Objective:  Labs: CBC    Component Value Date/Time   WBC 4.1 05/02/2020 1053   RBC 4.45 05/02/2020 1053   HGB 12.6 05/02/2020 1053   HGB 13.8 07/25/2015 1043   HCT 38.6 05/02/2020 1053   HCT 41.4 07/25/2015 1043   PLT 208 05/02/2020 1053   PLT 190 07/25/2015 1043   MCV 86.7 05/02/2020 1053   MCV 86 07/25/2015 1043   MCH 28.3 05/02/2020 1053   MCHC 32.6 05/02/2020 1053   RDW 13.0 05/02/2020 1053   RDW 13.9 07/25/2015 1043   LYMPHSABS 1,287 05/02/2020 1053   LYMPHSABS 0.8 07/25/2015 1043   MONOABS 0.5 12/13/2019 1013   EOSABS 0 (L) 05/02/2020 1053   EOSABS 0.0 07/25/2015 1043   BASOSABS 29 05/02/2020 1053   BASOSABS 0.0 07/25/2015 1043   CMP     Component Value Date/Time   NA 141 05/02/2020 1053   NA 144 07/25/2015 1043   K 3.9 05/02/2020 1053   CL 106 05/02/2020 1053   CO2 29 05/02/2020 1053   GLUCOSE 112 (H) 05/02/2020 1053   BUN 13 05/02/2020 1053   BUN 14 07/25/2015 1043   CREATININE 0.55 (L) 05/02/2020 1053   CALCIUM 9.0 05/02/2020 1053   PROT 6.2 05/02/2020 1053   PROT 7.0  07/25/2015 1043   ALBUMIN 3.9 12/17/2016 1622   ALBUMIN 4.1 07/25/2015 1043   AST 18 05/02/2020 1053   ALT 14 05/02/2020 1053   ALKPHOS 79 12/17/2016 1622   BILITOT 0.4 05/02/2020 1053   BILITOT <0.2 07/25/2015 1043   GFRNONAA 95 05/02/2020 1053   GFRAA 110 05/02/2020 1053    Imaging Studies: No results found.  Assessment and Plan:   PAULLETTE BOROS is a 70 y.o.  y/o female has been referred for GERD. Long standing , recent worsening of symptoms with some dysphagia and weight loss.   Plan  1. Urgent EGD within 2 weeks  2. Advised to take PPI first thing in the morning on an empty stomach and eat 30 mins after  F/u in office in 4-6 weeks   I have discussed alternative options, risks & benefits,  which include, but are not limited to, bleeding, infection, perforation,respiratory complication & drug reaction.  The patient agrees with this plan & written consent will be obtained.        I discussed the assessment and treatment plan with the patient. The patient was provided an opportunity to ask questions and all were answered. The patient agreed with the plan and demonstrated an understanding of the instructions.   The patient was advised to call back or seek an in-person evaluation if the symptoms worsen or if the condition fails to improve as anticipated.  I provided 15 minutes of non-face-to-face time during this encounter.   Dr Ebony Bellows MD,MRCP [facility]) Gastroenterology/Hepatology Pager: [phone]   Speech recognition software was used to dictate the above note.

## 2020-05-26 ENCOUNTER — Telehealth: Payer: Self-pay

## 2020-05-26 ENCOUNTER — Other Ambulatory Visit
Admission: RE | Admit: 2020-05-26 | Discharge: 2020-05-26 | Disposition: A | Discharge status: Home / Self Care | Payer: Medicare HMO | Source: Ambulatory Visit | Attending: Gastroenterology | Admitting: Gastroenterology

## 2020-05-26 ENCOUNTER — Other Ambulatory Visit: Payer: Self-pay

## 2020-05-26 DIAGNOSIS — Z20822 Contact with and (suspected) exposure to covid-19: Secondary | ICD-10-CM | POA: Diagnosis not present

## 2020-05-26 DIAGNOSIS — Z01812 Encounter for preprocedural laboratory examination: Secondary | ICD-10-CM | POA: Diagnosis not present

## 2020-05-26 NOTE — Telephone Encounter (Signed)
Pt would like to be informed if she needs to keep her 05/31/20 appt for fu on her thyroids since she is in a normal range? Please advise.

## 2020-05-27 LAB — SARS CORONAVIRUS 2 (TAT 6-24 HRS): SARS Coronavirus 2: NEGATIVE

## 2020-05-30 ENCOUNTER — Encounter
Admission: RE | Disposition: A | Discharge status: Home / Self Care | Payer: Self-pay | Source: Home / Self Care | Attending: Gastroenterology

## 2020-05-30 ENCOUNTER — Encounter: Payer: Self-pay | Admitting: Gastroenterology

## 2020-05-30 ENCOUNTER — Ambulatory Visit
Admission: RE | Admit: 2020-05-30 | Discharge: 2020-05-30 | Disposition: A | Discharge status: Home / Self Care | Payer: Medicare HMO | Attending: Gastroenterology | Admitting: Gastroenterology

## 2020-05-30 ENCOUNTER — Ambulatory Visit: Payer: Medicare HMO | Admitting: Certified Registered"

## 2020-05-30 DIAGNOSIS — R634 Abnormal weight loss: Secondary | ICD-10-CM

## 2020-05-30 DIAGNOSIS — Z6820 Body mass index (BMI) 20.0-20.9, adult: Secondary | ICD-10-CM | POA: Insufficient documentation

## 2020-05-30 DIAGNOSIS — K222 Esophageal obstruction: Secondary | ICD-10-CM | POA: Insufficient documentation

## 2020-05-30 DIAGNOSIS — Z888 Allergy status to other drugs, medicaments and biological substances status: Secondary | ICD-10-CM | POA: Diagnosis not present

## 2020-05-30 DIAGNOSIS — R1310 Dysphagia, unspecified: Secondary | ICD-10-CM | POA: Diagnosis not present

## 2020-05-30 DIAGNOSIS — Z79899 Other long term (current) drug therapy: Secondary | ICD-10-CM | POA: Insufficient documentation

## 2020-05-30 DIAGNOSIS — K2100 Gastro-esophageal reflux disease with esophagitis, without bleeding: Secondary | ICD-10-CM | POA: Diagnosis not present

## 2020-05-30 DIAGNOSIS — B3781 Candidal esophagitis: Secondary | ICD-10-CM | POA: Diagnosis not present

## 2020-05-30 HISTORY — PX: ESOPHAGOGASTRODUODENOSCOPY (EGD) WITH PROPOFOL: SHX5813

## 2020-05-30 LAB — KOH PREP: Special Requests: NORMAL

## 2020-05-30 SURGERY — ESOPHAGOGASTRODUODENOSCOPY (EGD) WITH PROPOFOL
Anesthesia: General

## 2020-05-30 MED ORDER — GLYCOPYRROLATE 0.2 MG/ML IJ SOLN
INTRAMUSCULAR | Status: DC | PRN
  Administered 2020-05-30: .2 mg via INTRAVENOUS

## 2020-05-30 MED ORDER — SODIUM CHLORIDE 0.9 % IV SOLN: INTRAVENOUS | Status: DC

## 2020-05-30 MED ORDER — LIDOCAINE HCL (CARDIAC) PF 100 MG/5ML IV SOSY
PREFILLED_SYRINGE | INTRAVENOUS | Status: DC | PRN
  Administered 2020-05-30: 50 mg via INTRAVENOUS

## 2020-05-30 MED ORDER — PROPOFOL 10 MG/ML IV BOLUS
INTRAVENOUS | Status: AC
  Filled 2020-05-30: qty 20

## 2020-05-30 MED ORDER — PROPOFOL 500 MG/50ML IV EMUL
INTRAVENOUS | Status: DC | PRN
  Administered 2020-05-30: 100 ug/kg/min via INTRAVENOUS

## 2020-05-30 MED ORDER — PROPOFOL 10 MG/ML IV BOLUS
INTRAVENOUS | Status: DC | PRN
  Administered 2020-05-30: 80 mg via INTRAVENOUS

## 2020-05-30 NOTE — Telephone Encounter (Signed)
Called and spoke with Husband and gave him the message

## 2020-05-30 NOTE — Anesthesia Postprocedure Evaluation (Signed)
Anesthesia Post Note  Patient: Ebony [name]  Procedure(s) Performed: ESOPHAGOGASTRODUODENOSCOPY (EGD) WITH PROPOFOL (N/A )  Patient location during evaluation: Endoscopy Anesthesia Type: General Level of consciousness: awake and alert Pain management: pain level controlled Vital Signs Assessment: post-procedure vital signs reviewed and stable Respiratory status: spontaneous breathing, nonlabored ventilation and respiratory function stable Cardiovascular status: blood pressure returned to baseline and stable Postop Assessment: no apparent nausea or vomiting Anesthetic complications: no   No complications documented.   Last Vitals:  Vitals:   05/30/20 1120 05/30/20 1130  BP: 128/68 124/85  Pulse: 61 65  Resp: 11 13  Temp:    SpO2: 100% 100%    Last Pain:  Vitals:   05/30/20 1110  TempSrc:   PainSc: 0-No pain                 Alphonsus Sias

## 2020-05-30 NOTE — Telephone Encounter (Signed)
Yes - pt does not need to come in tomorrow for appt.  She can do routine 6 month f/up -  I would recommend she come sooner if she continues to loose weight

## 2020-05-30 NOTE — Op Note (Signed)
[facility] Gastroenterology Patient Name: Ebony [name] Procedure Date: 05/30/2020 10:27 AM MRN: [id number] Account #: [id number] Date of Birth: March 21, 1951 Admit Type: Outpatient Age: 70 Room: [facility] ENDO ROOM 2 Gender: Female Note Status: Finalized Procedure:             Upper GI endoscopy Indications:           Dysphagia Providers:             Wyline Mood MD, MD Medicines:             Monitored Anesthesia Care Complications:         No immediate complications. Procedure:             Pre-Anesthesia Assessment:                        - Prior to the procedure, a History and Physical was                         performed, and patient medications, allergies and                         sensitivities were reviewed. The patient's tolerance                         of previous anesthesia was reviewed.                        - The risks and benefits of the procedure and the                         sedation options and risks were discussed with the                         patient. All questions were answered and informed                         consent was obtained.                        - ASA Grade Assessment: II - A patient with mild                         systemic disease.                        After obtaining informed consent, the endoscope was                         passed under direct vision. Throughout the procedure,                         the patient's blood pressure, pulse, and oxygen                         saturations were monitored continuously. The Endoscope                         was introduced through the mouth, and advanced to the  third part of duodenum. The upper GI endoscopy was                         accomplished with ease. The patient tolerated the                         procedure well. Findings:      Diffuse, white plaques were found in the entire esophagus. Brushings for       KOH prep were obtained in the entire  esophagus. Biopsies were taken with       a cold forceps for histology. Biopsies were taken with a cold forceps       for histology.      The esophagus was normal.      The cardia and gastric fundus were normal on retroflexion.      The examined duodenum was normal.      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 The narrowest stenosis measured 1.2 cm (inner       diameter) x less than one cm (in length). The stenoses were traversed. Impression:            - Esophageal plaques were found, suspicious for                         candidiasis. Brushings performed. Biopsied.                        - Normal esophagus.                        - Normal examined duodenum. Recommendation:        - Discharge patient to home (with escort).                        - Resume previous diet.                        - Continue present medications.                        - Await pathology results.                        - - Repeat upper endoscopy after ruling out EOE Procedure Code(s):     --- Professional ---                        [phone], Esophagogastroduodenoscopy, flexible,                         transoral; with biopsy, single or multiple Diagnosis Code(s):     --- Professional ---                        K22.9, Disease of esophagus, unspecified                        R13.10, Dysphagia, unspecified CPT copyright 2019 American Medical Association. All rights reserved. The codes documented in this report are preliminary and upon coder review may  be revised to meet current compliance requirements. Jonathon Bellows, MD  Jonathon Bellows MD, MD 05/30/2020 10:50:44 AM This report has been signed electronically. Number of Addenda: 0 Note Initiated On: 05/30/2020 10:27 AM Estimated Blood Loss:  Estimated blood loss: none.      [facility]

## 2020-05-30 NOTE — H&P (Signed)
Jonathon Bellows, MD [address] [place], [place], [place], [address] [address], [place], [place], [place], [address] Phone: [phone]  Fax: [phone]  Primary Care Physician:  Delsa Grana, PA-C   Pre-Procedure History & Physical: HPI:  Ebony [name] is a 70 y.o. female is here for an endoscopy    Past Medical History:  Diagnosis Date  . Allergy   . Anemia   . Arthritis    knees, hands  . Borderline diabetes mellitus   . Complication of anesthesia    slow to wake  . Dry eye syndrome of both eyes   . Hypercholesterolemia   . Prehypertension 12/17/2016  . Wears dentures    full upper and lower    Past Surgical History:  Procedure Laterality Date  . ABDOMINAL HYSTERECTOMY     complete  . COLONOSCOPY WITH PROPOFOL N/A 05/09/2017   Procedure: COLONOSCOPY WITH PROPOFOL;  Surgeon: Lucilla Lame, MD;  Location: [facility];  Service: Endoscopy;  Laterality: N/A;  . EYE SURGERY  08/04/2017  . KNEE SURGERY Bilateral     Prior to Admission medications   Medication Sig Start Date End Date Taking? Authorizing Provider  acetaminophen (TYLENOL) 500 MG tablet Take 1-2 tablets (500-1,000 mg total) by mouth every 8 (eight) hours as needed for moderate pain. OTC 05/02/20  Yes Delsa Grana, PA-C  Calcium Carbonate-Vit D-Min (CALCIUM 1200 PO) Take 600 mg by mouth daily.    Yes [provider]  Cyanocobalamin (VITAMIN B-12) 1000 MCG SUBL Place 1,000 mcg under the tongue daily.    Yes [provider]  fluticasone (FLONASE) 50 MCG/ACT nasal spray SPRAY 2 SPRAYS INTO EACH NOSTRIL EVERY DAY 02/08/20  Yes Delsa Grana, PA-C  levocetirizine (XYZAL) 5 MG tablet levocetirizine 5 mg tablet   Yes [provider]  pantoprazole (PROTONIX) 40 MG tablet Take 1 tablet (40 mg total) by mouth daily. For GERD/acid reflux 05/02/20  Yes Delsa Grana, PA-C  REFRESH CELLUVISC 1 % GEL  07/19/19  Yes [provider]  RESTASIS 0.05 % ophthalmic emulsion Instill  1 drop into both eyes twice a day 03/08/20  Yes [provider]  rosuvastatin (CRESTOR) 10 MG tablet Take 10 mg by mouth at bedtime. 04/25/20  Yes [provider]    Allergies as of 05/23/2020 - Review Complete 05/22/2020  Allergen Reaction Noted  . Celecoxib Anxiety and Itching 07/11/2015    Family History  Problem Relation Age of Onset  . Hypertension Mother   . Stroke Mother        4 mini strokes  . Thyroid disease Mother   . Kidney failure Father   . Diabetes Father   . Hypertension Father   . Kidney disease Father   . Emphysema Maternal Grandmother   . Diabetes Maternal Grandmother   . Hypertension Maternal Grandmother   . Diabetes Maternal Grandfather   . Alcohol abuse Maternal Grandfather   . Diabetes Paternal Grandmother   . Heart attack Paternal Grandmother   . Emphysema Paternal Grandfather   . Diabetes Paternal Grandfather   . Heart disease Paternal Grandfather   . Thyroid disease Sister   . Vitamin D deficiency Sister   . Arthritis Sister   . COPD Sister   . Fibromyalgia Sister   . Hypertension Sister   . Hyperlipidemia Sister   . Asthma Sister   . Thyroid disease Sister   . Breast cancer Other   . Cancer Neg Hx     Social History  Socioeconomic History  . Marital status: Married    Spouse name: Not on file  . Number of children: 3  . Years of education: Not on file  . Highest education level: High school graduate  Occupational History  . Not on file  Tobacco Use  . Smoking status: Never Smoker  . Smokeless tobacco: Never Used  Vaping Use  . Vaping Use: Never used  Substance and Sexual Activity  . Alcohol use: No  . Drug use: No  . Sexual activity: Not Currently  Other Topics Concern  . Not on file  Social History Narrative  . Not on file   Social Determinants of Health   Financial Resource Strain: Not on file  Food Insecurity: Not on file  Transportation Needs: Not on file  Physical Activity: Not on file  Stress:  Not on file  Social Connections: Not on file  Intimate Partner Violence: Not on file    Review of Systems: See HPI, otherwise negative ROS  Physical Exam: BP 130/83   Pulse 65   Temp (!) 97.4 F (36.3 C) (Temporal)   Resp 17   Ht 5\' 7"  (1.702 m)   Wt 59 kg   SpO2 100%   BMI 20.36 kg/m  General:   Alert,  pleasant and cooperative in NAD Head:  Normocephalic and atraumatic. Neck:  Supple; no masses or thyromegaly. Lungs:  Clear throughout to auscultation, normal respiratory effort.    Heart:  +S1, +S2, Regular rate and rhythm, No edema. Abdomen:  Soft, nontender and nondistended. Normal bowel sounds, without guarding, and without rebound.   Neurologic:  Alert and  oriented x4;  grossly normal neurologically.  Impression/Plan: Ebony [name] is here for an endoscopy  to be performed for  evaluation of weight loss , GERD worsening of symptoms    Risks, benefits, limitations, and alternatives regarding endoscopy have been reviewed with the patient.  Questions have been answered.  All parties agreeable.   Jonathon Bellows, MD  05/30/2020, 10:29 AM

## 2020-05-30 NOTE — Anesthesia Preprocedure Evaluation (Signed)
Anesthesia Evaluation  Patient identified by MRN, date of birth, ID band Patient awake    Reviewed: Allergy & Precautions, H&P , NPO status , reviewed documented beta blocker date and time   Airway Mallampati: I  TM Distance: >3 FB Neck ROM: full    Dental  (+) Upper Dentures, Lower Dentures   Pulmonary    Pulmonary exam normal        Cardiovascular Normal cardiovascular exam  02/2019 ECHO 1. Left ventricular ejection fraction, by visual estimation, is 50 to  55%. The left ventricle has normal function. There is no left ventricular  hypertrophy.  2. Global right ventricle has normal systolic function.The right  ventricular size is normal. No increase in right ventricular wall  thickness.  3. Left atrial size was normal.  4. Right atrial size was normal.  5. The mitral valve is normal in structure. Mild mitral valve  regurgitation.  6. The tricuspid valve is normal in structure. Tricuspid valve  regurgitation is trivial.  7. The aortic valve is normal in structure. Aortic valve regurgitation is  trivial by color flow Doppler.  8. The pulmonic valve was grossly normal. Pulmonic valve regurgitation is  trivial by color flow Doppler.    Neuro/Psych PSYCHIATRIC DISORDERS    GI/Hepatic GERD  Medicated,  Endo/Other    Renal/GU      Musculoskeletal   Abdominal   Peds  Hematology  (+) Blood dyscrasia, anemia ,   Anesthesia Other Findings Past Medical History: No date: Allergy No date: Anemia No date: Arthritis     Comment:  knees, hands No date: Borderline diabetes mellitus No date: Complication of anesthesia     Comment:  slow to wake No date: Dry eye syndrome of both eyes No date: Hypercholesterolemia 12/17/2016: Prehypertension No date: Wears dentures     Comment:  full upper and lower  Past Surgical History: No date: ABDOMINAL HYSTERECTOMY     Comment:  complete 05/09/2017: COLONOSCOPY WITH  PROPOFOL; N/A     Comment:  Procedure: COLONOSCOPY WITH PROPOFOL;  Surgeon: Lucilla Lame, MD;  Location: [facility];  Service:               Endoscopy;  Laterality: N/A; 08/04/2017: EYE SURGERY No date: KNEE SURGERY; Bilateral     Reproductive/Obstetrics                             Anesthesia Physical Anesthesia Plan  ASA: II  Anesthesia Plan: General   Post-op Pain Management:    Induction: Intravenous  PONV Risk Score and Plan: Treatment may vary due to age or medical condition and TIVA  Airway Management Planned: Nasal Cannula and Natural Airway  Additional Equipment:   Intra-op Plan:   Post-operative Plan:   Informed Consent: I have reviewed the patients History and Physical, chart, labs and discussed the procedure including the risks, benefits and alternatives for the proposed anesthesia with the patient or authorized representative who has indicated his/her understanding and acceptance.     Dental Advisory Given  Plan Discussed with: CRNA  Anesthesia Plan Comments:         Anesthesia Quick Evaluation

## 2020-05-30 NOTE — Transfer of Care (Signed)
Immediate Anesthesia Transfer of Care Note  Patient: Ebony [name]  Procedure(s) Performed: ESOPHAGOGASTRODUODENOSCOPY (EGD) WITH PROPOFOL (N/A )  Patient Location: PACU and Endoscopy Unit  Anesthesia Type:General  Level of Consciousness: drowsy  Airway & Oxygen Therapy: Patient Spontanous Breathing  Post-op Assessment: Report given to RN  Post vital signs: stable  Last Vitals:  Vitals Value Taken Time  BP 97/61 05/30/20 1050  Temp    Pulse 86 05/30/20 1050  Resp 11 05/30/20 1050  SpO2 97 % 05/30/20 1050  Vitals shown include unvalidated device data.  Last Pain:  Vitals:   05/30/20 0958  TempSrc: Temporal         Complications: No complications documented.

## 2020-05-31 ENCOUNTER — Ambulatory Visit: Payer: Medicare HMO | Admitting: Family Medicine

## 2020-05-31 ENCOUNTER — Encounter: Payer: Self-pay | Admitting: Gastroenterology

## 2020-06-01 LAB — SURGICAL PATHOLOGY

## 2020-06-12 ENCOUNTER — Telehealth: Payer: Self-pay

## 2020-06-12 ENCOUNTER — Other Ambulatory Visit: Payer: Self-pay

## 2020-06-12 DIAGNOSIS — R1310 Dysphagia, unspecified: Secondary | ICD-10-CM | POA: Insufficient documentation

## 2020-06-12 DIAGNOSIS — R634 Abnormal weight loss: Secondary | ICD-10-CM

## 2020-06-12 HISTORY — DX: Dysphagia, unspecified: R13.10

## 2020-06-12 MED ORDER — FLUCONAZOLE 200 MG PO TABS: ORAL_TABLET | ORAL | 0 refills | Status: DC

## 2020-06-12 NOTE — Telephone Encounter (Signed)
-----   Message from Jonathon Bellows, MD sent at 06/11/2020 12:43 PM EST ----- Ebony [name]   Inform   1. Biopsies of esophagus show candida esophagitis ie yeast infection of esophagus- this can cause difficulty swallowing and decreased food intake  2. Commence on Diflucan 400 mg x1 on day 1 followed by daily diflucan 200 mg a day for 13 days , total 14 days  3. Likely due to use of fluticasone inhaler- suggest after using inhaler to rinse mouth and spit   4. EGD in 6 weeks to assess resonse to therapy and dilate stricture noted previously    C/c Delsa Grana, PA-C   Dr Jonathon Bellows MD,MRCP [facility]) Gastroenterology/Hepatology Pager: [phone]

## 2020-06-12 NOTE — Telephone Encounter (Signed)
Called and informed patient of Dr. Georgeann Oppenheim recommendation/comments. Scheduled patient for procedure and will mail instructions to patient home. Medication has been sent to pharmacy. Pt verbalized understanding.

## 2020-06-16 ENCOUNTER — Inpatient Hospital Stay: Payer: Medicare HMO | Attending: Oncology

## 2020-06-16 ENCOUNTER — Inpatient Hospital Stay: Payer: Medicare HMO | Admitting: Oncology

## 2020-06-16 ENCOUNTER — Other Ambulatory Visit: Payer: Self-pay | Admitting: *Deleted

## 2020-06-16 ENCOUNTER — Encounter: Payer: Self-pay | Admitting: Oncology

## 2020-06-16 ENCOUNTER — Other Ambulatory Visit: Payer: Self-pay

## 2020-06-16 VITALS — BP 129/91 | HR 65 | Temp 96.0°F | Resp 16 | Wt 131.7 lb

## 2020-06-16 DIAGNOSIS — D72819 Decreased white blood cell count, unspecified: Secondary | ICD-10-CM | POA: Diagnosis not present

## 2020-06-16 DIAGNOSIS — Z8249 Family history of ischemic heart disease and other diseases of the circulatory system: Secondary | ICD-10-CM | POA: Diagnosis not present

## 2020-06-16 DIAGNOSIS — R1310 Dysphagia, unspecified: Secondary | ICD-10-CM | POA: Insufficient documentation

## 2020-06-16 DIAGNOSIS — Z90710 Acquired absence of both cervix and uterus: Secondary | ICD-10-CM | POA: Diagnosis not present

## 2020-06-16 DIAGNOSIS — E119 Type 2 diabetes mellitus without complications: Secondary | ICD-10-CM | POA: Insufficient documentation

## 2020-06-16 DIAGNOSIS — Z8261 Family history of arthritis: Secondary | ICD-10-CM | POA: Insufficient documentation

## 2020-06-16 DIAGNOSIS — Z803 Family history of malignant neoplasm of breast: Secondary | ICD-10-CM | POA: Diagnosis not present

## 2020-06-16 DIAGNOSIS — D709 Neutropenia, unspecified: Secondary | ICD-10-CM

## 2020-06-16 DIAGNOSIS — Z833 Family history of diabetes mellitus: Secondary | ICD-10-CM | POA: Insufficient documentation

## 2020-06-16 DIAGNOSIS — Z8349 Family history of other endocrine, nutritional and metabolic diseases: Secondary | ICD-10-CM | POA: Diagnosis not present

## 2020-06-16 DIAGNOSIS — E7800 Pure hypercholesterolemia, unspecified: Secondary | ICD-10-CM | POA: Insufficient documentation

## 2020-06-16 DIAGNOSIS — Z79899 Other long term (current) drug therapy: Secondary | ICD-10-CM | POA: Insufficient documentation

## 2020-06-16 LAB — CBC WITH DIFFERENTIAL/PLATELET
Abs Immature Granulocytes: 0.01 10*3/uL (ref 0.00–0.07)
Basophils Absolute: 0.1 10*3/uL (ref 0.0–0.1)
Basophils Relative: 2 %
Eosinophils Absolute: 0 10*3/uL (ref 0.0–0.5)
Eosinophils Relative: 0 %
HCT: 38.1 % (ref 36.0–46.0)
Hemoglobin: 12.6 g/dL (ref 12.0–15.0)
Immature Granulocytes: 0 %
Lymphocytes Relative: 31 %
Lymphs Abs: 1.1 10*3/uL (ref 0.7–4.0)
MCH: 29.1 pg (ref 26.0–34.0)
MCHC: 33.1 g/dL (ref 30.0–36.0)
MCV: 88 fL (ref 80.0–100.0)
Monocytes Absolute: 0.5 10*3/uL (ref 0.1–1.0)
Monocytes Relative: 13 %
Neutro Abs: 1.9 10*3/uL (ref 1.7–7.7)
Neutrophils Relative %: 54 %
Platelets: 199 10*3/uL (ref 150–400)
RBC: 4.33 MIL/uL (ref 3.87–5.11)
RDW: 14.1 % (ref 11.5–15.5)
WBC: 3.5 10*3/uL — ABNORMAL LOW (ref 4.0–10.5)
nRBC: 0 % (ref 0.0–0.2)

## 2020-06-16 MED ORDER — NYSTATIN 100000 UNIT/ML MT SUSP: 5.0000 mL | Freq: Four times a day (QID) | OROMUCOSAL | 0 refills | Status: DC

## 2020-06-16 NOTE — Progress Notes (Signed)
[employer]  [phone] [phone]  ID: Ebony [name] OB: 09-29-1950  MR#: [id number]  YJE#:[id number]  Patient Care Team: Delsa Grana, PA-C as PCP - General (Family Medicine) Lloyd Huger, MD as Consulting Physician (Oncology)  CHIEF COMPLAINT: Leukopenia.  INTERVAL HISTORY: Patient returns to clinic today for repeat laboratory work and further evaluation.  She was last seen in clinic on 12/13/2019.  In the interim, she has done well.  She developed some dysphagia and was seen by Dr. Vicente Males and had an EGD which showed diffuse white plaque in the entire esophagus.  Brushings for KOH prep was obtained.  Findings were consistent with esophagitis.  She is given a prescription for Diflucan.  States she is unable to swallow the pills.  She denies any recent infections.  Endorses mild fluctuation in her weight.  Appetite is good.  Denies any recent fevers or illness, chest pain, shortness of breath, cough or hemoptysis. She denies any nausea, vomiting, constipation, or diarrhea.  She has no urinary complaints.  Patient offers no specific complaints today.  Dysphagia secondary to esophagitis  REVIEW OF SYSTEMS:   Review of Systems  Constitutional: Positive for weight loss (weight up and down). Negative for fever and malaise/fatigue.  HENT: Positive for sore throat (dysphagia).   Respiratory: Negative.  Negative for cough, hemoptysis and shortness of breath.   Cardiovascular: Negative.  Negative for chest pain and leg swelling.  Gastrointestinal: Negative.  Negative for abdominal pain.  Genitourinary: Negative.  Negative for dysuria.  Musculoskeletal: Negative.  Negative for back pain.  Skin: Negative.  Negative for rash.  Neurological: Negative.  Negative for dizziness, focal weakness, weakness and headaches.  Psychiatric/Behavioral: The patient is not nervous/anxious.     As per HPI. Otherwise, a complete review of systems is negative.  PAST  MEDICAL HISTORY: Past Medical History:  Diagnosis Date  . Allergy   . Anemia   . Arthritis    knees, hands  . Borderline diabetes mellitus   . Complication of anesthesia    slow to wake  . Dry eye syndrome of both eyes   . Hypercholesterolemia   . Prehypertension 12/17/2016  . Wears dentures    full upper and lower    PAST SURGICAL HISTORY: Past Surgical History:  Procedure Laterality Date  . ABDOMINAL HYSTERECTOMY     complete  . COLONOSCOPY WITH PROPOFOL N/A 05/09/2017   Procedure: COLONOSCOPY WITH PROPOFOL;  Surgeon: Lucilla Lame, MD;  Location: [facility];  Service: Endoscopy;  Laterality: N/A;  . ESOPHAGOGASTRODUODENOSCOPY (EGD) WITH PROPOFOL N/A 05/30/2020   Procedure: ESOPHAGOGASTRODUODENOSCOPY (EGD) WITH PROPOFOL;  Surgeon: Jonathon Bellows, MD;  Location: [facility] ENDOSCOPY;  Service: Gastroenterology;  Laterality: N/A;  . EYE SURGERY  08/04/2017  . KNEE SURGERY Bilateral     FAMILY HISTORY: Family History  Problem Relation Age of Onset  . Hypertension Mother   . Stroke Mother        4 mini strokes  . Thyroid disease Mother   . Kidney failure Father   . Diabetes Father   . Hypertension Father   . Kidney disease Father   . Emphysema Maternal Grandmother   . Diabetes Maternal Grandmother   . Hypertension Maternal Grandmother   . Diabetes Maternal Grandfather   . Alcohol abuse Maternal Grandfather   . Diabetes Paternal Grandmother   . Heart attack Paternal Grandmother   . Emphysema Paternal Grandfather   . Diabetes Paternal Grandfather   . Heart disease Paternal Grandfather   .  Thyroid disease Sister   . Vitamin D deficiency Sister   . Arthritis Sister   . COPD Sister   . Fibromyalgia Sister   . Hypertension Sister   . Hyperlipidemia Sister   . Asthma Sister   . Thyroid disease Sister   . Breast cancer Other   . Cancer Neg Hx     ADVANCED DIRECTIVES (Y/N):  N  HEALTH MAINTENANCE: Social History   Tobacco Use  . Smoking status: Never Smoker  .  Smokeless tobacco: Never Used  Vaping Use  . Vaping Use: Never used  Substance Use Topics  . Alcohol use: No  . Drug use: No     Colonoscopy:  PAP:  Bone density:  Lipid panel:  Allergies  Allergen Reactions  . Celecoxib Anxiety and Itching    Current Outpatient Medications  Medication Sig Dispense Refill  . acetaminophen (TYLENOL) 500 MG tablet Take 1-2 tablets (500-1,000 mg total) by mouth every 8 (eight) hours as needed for moderate pain. OTC 30 tablet 0  . Calcium Carbonate-Vit D-Min (CALCIUM 1200 PO) Take 600 mg by mouth daily.     . Cyanocobalamin (VITAMIN B-12) 1000 MCG SUBL Place 1,000 mcg under the tongue daily.     . fluconazole (DIFLUCAN) 200 MG tablet Take 2 tablets on day 1. Then one tablet daily for 13 days. 15 tablet 0  . fluticasone (FLONASE) 50 MCG/ACT nasal spray SPRAY 2 SPRAYS INTO EACH NOSTRIL EVERY DAY 48 mL 1  . levocetirizine (XYZAL) 5 MG tablet levocetirizine 5 mg tablet    . pantoprazole (PROTONIX) 40 MG tablet Take 1 tablet (40 mg total) by mouth daily. For GERD/acid reflux 90 tablet 1  . REFRESH CELLUVISC 1 % GEL     . RESTASIS 0.05 % ophthalmic emulsion Instill 1 drop into both eyes twice a day    . rosuvastatin (CRESTOR) 10 MG tablet Take 10 mg by mouth at bedtime.     No current facility-administered medications for this visit.    OBJECTIVE: There were no vitals filed for this visit.   There is no height or weight on file to calculate BMI.    ECOG FS:0 - Asymptomatic  Physical Exam Constitutional:      General: Vital signs are normal.     Appearance: Normal appearance.  HENT:     Head: Normocephalic and atraumatic.  Eyes:     Pupils: Pupils are equal, round, and reactive to light.  Cardiovascular:     Rate and Rhythm: Normal rate and regular rhythm.     Heart sounds: Normal heart sounds. No murmur heard.   Pulmonary:     Effort: Pulmonary effort is normal.     Breath sounds: Normal breath sounds. No wheezing.  Abdominal:     General:  Bowel sounds are normal. There is no distension.     Palpations: Abdomen is soft.     Tenderness: There is no abdominal tenderness.  Musculoskeletal:        General: No edema. Normal range of motion.     Cervical back: Normal range of motion.  Skin:    General: Skin is warm and dry.     Findings: No rash.  Neurological:     Mental Status: She is alert and oriented to person, place, and time.  Psychiatric:        Judgment: Judgment normal.      LAB RESULTS:  Lab Results  Component Value Date   NA 141 05/02/2020   K 3.9 05/02/2020  CL 106 05/02/2020   CO2 29 05/02/2020   GLUCOSE 112 (H) 05/02/2020   BUN 13 05/02/2020   CREATININE 0.55 (L) 05/02/2020   CALCIUM 9.0 05/02/2020   PROT 6.2 05/02/2020   ALBUMIN 3.9 12/17/2016   AST 18 05/02/2020   ALT 14 05/02/2020   ALKPHOS 79 12/17/2016   BILITOT 0.4 05/02/2020   GFRNONAA 95 05/02/2020   GFRAA 110 05/02/2020    Lab Results  Component Value Date   WBC 4.1 05/02/2020   NEUTROABS 2,329 05/02/2020   HGB 12.6 05/02/2020   HCT 38.6 05/02/2020   MCV 86.7 05/02/2020   PLT 208 05/02/2020     STUDIES: No results found.  ASSESSMENT: Leukopenia.   1.  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   -Baseline white count is between 3-4.  -Labs from 06/16/2020 show a white count of 3.5.  Stable. -Previous work-up included peripheral flow cytometry which was negative. -Patient does not require bone marrow biopsy at this time. -Repeat blood work in 6 months.  If blood work is stable at that time, patient likely can be followed by her PCP  2.  Dysphagia: -Was evaluated by GI -Had EGD which showed diffuse white plaques in the entire esophagus. -She was started on Diflucan but unfortunately is unable to swallow the tablets. -We will send a prescription in for nystatin swish and swallow.  Disposition: -RTC in 6 months with repeat labs (CBC with differential) and MD  assessment.  Greater than 50% was spent in counseling and coordination of care with this patient including but not limited to discussion of the relevant topics above (See A&P) including, but not limited to diagnosis and management of acute and chronic medical conditions.   Patient expressed understanding and was in agreement with this plan. She also understands that She can call clinic at any time with any questions, concerns, or complaints.    Jacquelin Hawking, NP   06/16/2020 10:23 AM

## 2020-06-16 NOTE — Progress Notes (Signed)
Patient denies any concerns today.  

## 2020-06-19 DIAGNOSIS — H16223 Keratoconjunctivitis sicca, not specified as Sjogren's, bilateral: Secondary | ICD-10-CM | POA: Diagnosis not present

## 2020-06-24 ENCOUNTER — Other Ambulatory Visit: Payer: Self-pay | Admitting: Oncology

## 2020-06-27 ENCOUNTER — Ambulatory Visit (INDEPENDENT_AMBULATORY_CARE_PROVIDER_SITE_OTHER): Payer: Medicare HMO

## 2020-06-27 DIAGNOSIS — Z0000 Encounter for general adult medical examination without abnormal findings: Secondary | ICD-10-CM | POA: Diagnosis not present

## 2020-06-27 NOTE — Patient Instructions (Signed)
[name] , Thank you for taking time to come for your Medicare Wellness Visit. I appreciate your ongoing commitment to your health goals. Please review the following plan we discussed and let me know if I can assist you in the future.   Screening recommendations/referrals: Colonoscopy: done 05/09/17 Mammogram: done 02/23/20 Bone Density: done 02/23/20 Recommended yearly ophthalmology/optometry visit for glaucoma screening and checkup Recommended yearly dental visit for hygiene and checkup  Vaccinations: Influenza vaccine: declined Pneumococcal vaccine: declined Tdap vaccine: due Shingles vaccine: Shingrix discussed. Please contact your pharmacy for coverage information.  Covid-19: done 07/10/19, 07/31/19 & 03/02/20  Advanced directives: Advance directive discussed with you today. I have provided a copy for you to complete at home and have notarized. Once this is complete please bring a copy in to our office so we can scan it into your chart.  Conditions/risks identified: Keep up the great work!  Next appointment: Follow up in one year for your annual wellness visit    Preventive Care 65 Years and Older, Female Preventive care refers to lifestyle choices and visits with your health care provider that can promote health and wellness. What does preventive care include?  A yearly physical exam. This is also called an annual well check.  Dental exams once or twice a year.  Routine eye exams. Ask your health care provider how often you should have your eyes checked.  Personal lifestyle choices, including:  Daily care of your teeth and gums.  Regular physical activity.  Eating a healthy diet.  Avoiding tobacco and drug use.  Limiting alcohol use.  Practicing safe sex.  Taking low-dose aspirin every day.  Taking vitamin and mineral supplements as recommended by your health care provider. What happens during an annual well check? The services and screenings done by your health  care provider during your annual well check will depend on your age, overall health, lifestyle risk factors, and family history of disease. Counseling  Your health care provider may ask you questions about your:  Alcohol use.  Tobacco use.  Drug use.  Emotional well-being.  Home and relationship well-being.  Sexual activity.  Eating habits.  History of falls.  Memory and ability to understand (cognition).  Work and work Statistician.  Reproductive health. Screening  You may have the following tests or measurements:  Height, weight, and BMI.  Blood pressure.  Lipid and cholesterol levels. These may be checked every 5 years, or more frequently if you are over 71 years old.  Skin check.  Lung cancer screening. You may have this screening every year starting at age 52 if you have a 30-pack-year history of smoking and currently smoke or have quit within the past 15 years.  Fecal occult blood test (FOBT) of the stool. You may have this test every year starting at age 55.  Flexible sigmoidoscopy or colonoscopy. You may have a sigmoidoscopy every 5 years or a colonoscopy every 10 years starting at age 23.  Hepatitis C blood test.  Hepatitis B blood test.  Sexually transmitted disease (STD) testing.  Diabetes screening. This is done by checking your blood sugar (glucose) after you have not eaten for a while (fasting). You may have this done every 1-3 years.  Bone density scan. This is done to screen for osteoporosis. You may have this done starting at age 30.  Mammogram. This may be done every 1-2 years. Talk to your health care provider about how often you should have regular mammograms. Talk with your health care provider  about your test results, treatment options, and if necessary, the need for more tests. Vaccines  Your health care provider may recommend certain vaccines, such as:  Influenza vaccine. This is recommended every year.  Tetanus, diphtheria, and  acellular pertussis (Tdap, Td) vaccine. You may need a Td booster every 10 years.  Zoster vaccine. You may need this after age 33.  Pneumococcal 13-valent conjugate (PCV13) vaccine. One dose is recommended after age 45.  Pneumococcal polysaccharide (PPSV23) vaccine. One dose is recommended after age 27. Talk to your health care provider about which screenings and vaccines you need and how often you need them. This information is not intended to replace advice given to you by your health care provider. Make sure you discuss any questions you have with your health care provider. Document Released: 05/19/2015 Document Revised: 01/10/2016 Document Reviewed: 02/21/2015 Elsevier Interactive Patient Education  2017 [place] Prevention in the Home Falls can cause injuries. They can happen to people of all ages. There are many things you can do to make your home safe and to help prevent falls. What can I do on the outside of my home?  Regularly fix the edges of walkways and driveways and fix any cracks.  Remove anything that might make you trip as you walk through a door, such as a raised step or threshold.  Trim any bushes or trees on the path to your home.  Use bright outdoor lighting.  Clear any walking paths of anything that might make someone trip, such as rocks or tools.  Regularly check to see if handrails are loose or broken. Make sure that both sides of any steps have handrails.  Any raised decks and porches should have guardrails on the edges.  Have any leaves, snow, or ice cleared regularly.  Use sand or salt on walking paths during winter.  Clean up any spills in your garage right away. This includes oil or grease spills. What can I do in the bathroom?  Use night lights.  Install grab bars by the toilet and in the tub and shower. Do not use towel bars as grab bars.  Use non-skid mats or decals in the tub or shower.  If you need to sit down in the shower, use  a plastic, non-slip stool.  Keep the floor dry. Clean up any water that spills on the floor as soon as it happens.  Remove soap buildup in the tub or shower regularly.  Attach bath mats securely with double-sided non-slip rug tape.  Do not have throw rugs and other things on the floor that can make you trip. What can I do in the bedroom?  Use night lights.  Make sure that you have a light by your bed that is easy to reach.  Do not use any sheets or blankets that are too big for your bed. They should not hang down onto the floor.  Have a firm chair that has side arms. You can use this for support while you get dressed.  Do not have throw rugs and other things on the floor that can make you trip. What can I do in the kitchen?  Clean up any spills right away.  Avoid walking on wet floors.  Keep items that you use a lot in easy-to-reach places.  If you need to reach something above you, use a strong step stool that has a grab bar.  Keep electrical cords out of the way.  Do not use floor polish or  wax that makes floors slippery. If you must use wax, use non-skid floor wax.  Do not have throw rugs and other things on the floor that can make you trip. What can I do with my stairs?  Do not leave any items on the stairs.  Make sure that there are handrails on both sides of the stairs and use them. Fix handrails that are broken or loose. Make sure that handrails are as long as the stairways.  Check any carpeting to make sure that it is firmly attached to the stairs. Fix any carpet that is loose or worn.  Avoid having throw rugs at the top or bottom of the stairs. If you do have throw rugs, attach them to the floor with carpet tape.  Make sure that you have a light switch at the top of the stairs and the bottom of the stairs. If you do not have them, ask someone to add them for you. What else can I do to help prevent falls?  Wear shoes that:  Do not have high heels.  Have  rubber bottoms.  Are comfortable and fit you well.  Are closed at the toe. Do not wear sandals.  If you use a stepladder:  Make sure that it is fully opened. Do not climb a closed stepladder.  Make sure that both sides of the stepladder are locked into place.  Ask someone to hold it for you, if possible.  Clearly mark and make sure that you can see:  Any grab bars or handrails.  First and last steps.  Where the edge of each step is.  Use tools that help you move around (mobility aids) if they are needed. These include:  Canes.  Walkers.  Scooters.  Crutches.  Turn on the lights when you go into a dark area. Replace any light bulbs as soon as they burn out.  Set up your furniture so you have a clear path. Avoid moving your furniture around.  If any of your floors are uneven, fix them.  If there are any pets around you, be aware of where they are.  Review your medicines with your doctor. Some medicines can make you feel dizzy. This can increase your chance of falling. Ask your doctor what other things that you can do to help prevent falls. This information is not intended to replace advice given to you by your health care provider. Make sure you discuss any questions you have with your health care provider. Document Released: 02/16/2009 Document Revised: 09/28/2015 Document Reviewed: 05/27/2014 Elsevier Interactive Patient Education  2017 [employer].

## 2020-06-27 NOTE — Progress Notes (Signed)
Subjective:   Ebony [name] is a 70 y.o. female who presents for Medicare Annual (Subsequent) preventive examination.  Virtual Visit via Telephone Note  I connected with  Ebony [name] on 06/27/20 at  8:40 AM EST by telephone and verified that I am speaking with the correct person using two identifiers.  Location: Patient: [place] Provider: [place] Persons participating in the virtual visit: patient/Nurse Health Advisor   I discussed the limitations, risks, security and privacy concerns of performing an evaluation and management service by telephone and the availability of in person appointments. The patient expressed understanding and agreed to proceed.  Interactive audio and video telecommunications were attempted between this nurse and patient, however failed, due to patient having technical difficulties OR patient did not have access to video capability.  We continued and completed visit with audio only.  Some vital signs may be absent or patient reported.   Clemetine Marker, LPN    Review of Systems     Cardiac Risk Factors include: advanced age (>23men, >2 women);dyslipidemia     Objective:    Today's Vitals   06/27/20 0859  PainSc: 8    There is no height or weight on file to calculate BMI.  Advanced Directives 06/27/2020 06/16/2020 03/17/2020 12/13/2019 11/02/2019 09/27/2019 09/27/2019  Does Patient Have a Medical Advance Directive? No No No No No - No  Would patient like information on creating a medical advance directive? Yes (MAU/Ambulatory/Procedural Areas - Information given) No - Patient declined - - No - Patient declined Yes (ED - Information included in AVS) No - Patient declined    Current Medications (verified) Outpatient Encounter Medications as of 06/27/2020  Medication Sig  . acetaminophen (TYLENOL) 500 MG tablet Take 1-2 tablets (500-1,000 mg total) by mouth every 8 (eight) hours as needed for moderate pain. OTC  . Calcium Carbonate-Vit D-Min (CALCIUM 1200 PO)  Take 600 mg by mouth daily.   . Cholecalciferol (VITAMIN D) 125 MCG (5000 UT) CAPS Take 1 capsule by mouth daily.  . Cyanocobalamin (VITAMIN B-12) 1000 MCG SUBL Place 1,000 mcg under the tongue daily.   . fluticasone (FLONASE) 50 MCG/ACT nasal spray SPRAY 2 SPRAYS INTO EACH NOSTRIL EVERY DAY  . levocetirizine (XYZAL) 5 MG tablet levocetirizine 5 mg tablet  . nystatin (MYCOSTATIN) 100000 UNIT/ML suspension USE AS DIRECTED 5 MLS (500,000 UNITS TOTAL) IN THE MOUTH OR THROAT 4 (FOUR) TIMES DAILY.  . pantoprazole (PROTONIX) 40 MG tablet Take 1 tablet (40 mg total) by mouth daily. For GERD/acid reflux  . REFRESH CELLUVISC 1 % GEL   . RESTASIS 0.05 % ophthalmic emulsion Instill 1 drop into both eyes twice a day  . rosuvastatin (CRESTOR) 10 MG tablet Take 10 mg by mouth at bedtime.  . [DISCONTINUED] fluconazole (DIFLUCAN) 200 MG tablet Take 2 tablets on day 1. Then one tablet daily for 13 days.   No facility-administered encounter medications on file as of 06/27/2020.    Allergies (verified) Celecoxib   History: Past Medical History:  Diagnosis Date  . Allergy   . Anemia   . Arthritis    knees, hands  . Borderline diabetes mellitus   . Complication of anesthesia    slow to wake  . Dry eye syndrome of both eyes   . GERD (gastroesophageal reflux disease)   . Hypercholesterolemia   . Prehypertension 12/17/2016  . Wears dentures    full upper and lower   Past Surgical History:  Procedure Laterality Date  . ABDOMINAL HYSTERECTOMY  complete  . COLONOSCOPY WITH PROPOFOL N/A 05/09/2017   Procedure: COLONOSCOPY WITH PROPOFOL;  Surgeon: Lucilla Lame, MD;  Location: [place];  Service: Endoscopy;  Laterality: N/A;  . ESOPHAGOGASTRODUODENOSCOPY (EGD) WITH PROPOFOL N/A 05/30/2020   Procedure: ESOPHAGOGASTRODUODENOSCOPY (EGD) WITH PROPOFOL;  Surgeon: Jonathon Bellows, MD;  Location[facility] ENDOSCOPY;  Service: Gastroenterology;  Laterality: N/A;  . EYE SURGERY  08/04/2017  . KNEE SURGERY  Bilateral    Family History  Problem Relation Age of Onset  . Hypertension Mother   . Stroke Mother        4 mini strokes  . Thyroid disease Mother   . Kidney failure Father   . Diabetes Father   . Hypertension Father   . Kidney disease Father   . Emphysema Maternal Grandmother   . Diabetes Maternal Grandmother   . Hypertension Maternal Grandmother   . Diabetes Maternal Grandfather   . Alcohol abuse Maternal Grandfather   . Diabetes Paternal Grandmother   . Heart attack Paternal Grandmother   . Emphysema Paternal Grandfather   . Diabetes Paternal Grandfather   . Heart disease Paternal Grandfather   . Thyroid disease Sister   . Vitamin D deficiency Sister   . Arthritis Sister   . COPD Sister   . Fibromyalgia Sister   . Hypertension Sister   . Hyperlipidemia Sister   . Asthma Sister   . Thyroid disease Sister   . Breast cancer Other   . Cancer Neg Hx    Social History   Socioeconomic History  . Marital status: Married    Spouse name: Not on file  . Number of children: 3  . Years of education: Not on file  . Highest education level: High school graduate  Occupational History  . Not on file  Tobacco Use  . Smoking status: Never Smoker  . Smokeless tobacco: Never Used  Vaping Use  . Vaping Use: Never used  Substance and Sexual Activity  . Alcohol use: No  . Drug use: No  . Sexual activity: Not Currently  Other Topics Concern  . Not on file  Social History Narrative  . Not on file   Social Determinants of Health   Financial Resource Strain: Low Risk   . Difficulty of Paying Living Expenses: Not hard at all  Food Insecurity: No Food Insecurity  . Worried About Charity fundraiser in the Last Year: Never true  . Ran Out of Food in the Last Year: Never true  Transportation Needs: No Transportation Needs  . Lack of Transportation (Medical): No  . Lack of Transportation (Non-Medical): No  Physical Activity: Sufficiently Active  . Days of Exercise per Week: 7  days  . Minutes of Exercise per Session: 60 min  Stress: No Stress Concern Present  . Feeling of Stress : Not at all  Social Connections: Moderately Integrated  . Frequency of Communication with Friends and Family: More than three times a week  . Frequency of Social Gatherings with Friends and Family: More than three times a week  . Attends Religious Services: More than 4 times per year  . Active Member of Clubs or Organizations: No  . Attends Archivist Meetings: Never  . Marital Status: Married    Tobacco Counseling Counseling given: Not Answered   Clinical Intake:  Pre-visit preparation completed: Yes  Pain : 0-10 Pain Score: 8  Pain Type: Chronic pain Pain Location: Knee Pain Orientation: Right,Left Pain Descriptors / Indicators: Aching,Sore Pain Onset: More than a month  ago Pain Frequency: Constant     Nutritional Risks: None Diabetes: No  How often do you need to have someone help you when you read instructions, pamphlets, or other written materials from your doctor or pharmacy?: 1 - Never    Interpreter Needed?: No  Information entered by :: Clemetine Marker LPN   Activities of Daily Living In your present state of health, do you have any difficulty performing the following activities: 06/27/2020 05/02/2020  Hearing? N N  Comment declines hearing aids -  Vision? Y Y  Difficulty concentrating or making decisions? N N  Walking or climbing stairs? Y Y  Dressing or bathing? N N  Doing errands, shopping? N N  Preparing Food and eating ? N -  Using the Toilet? N -  In the past six months, have you accidently leaked urine? Y -  Comment wears pads for protection -  Do you have problems with loss of bowel control? N -  Managing your Medications? N -  Managing your Finances? N -  Housekeeping or managing your Housekeeping? N -  Some recent data might be hidden    Patient Care Team: Delsa Grana, PA-C as PCP - General (Family Medicine) Lloyd Huger, MD as Consulting Physician (Oncology)  Indicate any recent Medical Services you may have received from other than [facility] providers in the past year (date may be approximate).     Assessment:   This is a routine wellness examination for Tykia.  Hearing/Vision screen  Hearing Screening   125Hz  250Hz  500Hz  1000Hz  2000Hz  3000Hz  4000Hz  6000Hz  8000Hz   Right ear:           Left ear:           Comments: Pt denies hearing difficulty  Vision Screening Comments: [facility] screenings [facility] Dr. Thomasene Ripple  Dietary issues and exercise activities discussed: Current Exercise Habits: Home exercise routine, Type of exercise: walking;Other - see comments (yard work, Garment/textile technologist), Time (Minutes): 60, Frequency (Times/Week): 7, Weekly Exercise (Minutes/Week): 420, Intensity: Mild, Exercise limited by: orthopedic condition(s)  Goals    .  DIET - INCREASE WATER INTAKE      Recommend water intake of 6-8 glasses of water.     .  Reduce sugar intake (pt-stated)      She will try to reduce sugar intake      Depression Screen PHQ 2/9 Scores 06/27/2020 05/02/2020 11/01/2019 09/15/2019 06/14/2019 05/04/2019 03/03/2019  PHQ - 2 Score 0 0 0 0 0 0 0  PHQ- 9 Score - - 0 0 0 - 0    Fall Risk Fall Risk  06/27/2020 05/02/2020 11/01/2019 09/15/2019 06/14/2019  Falls in the past year? 0 0 0 0 0  Number falls in past yr: 0 0 0 0 0  Injury with Fall? 0 0 0 0 0  Risk for fall due to : No Fall Risks - - - -  Follow up Falls prevention discussed Falls evaluation completed Falls evaluation completed - -     FALL RISK PREVENTION PERTAINING TO THE HOME:  Any stairs in or around the home? Yes  If so, are there any without handrails? No  Home free of loose throw rugs in walkways, pet beds, electrical cords, etc? Yes  Adequate lighting in your home to reduce risk of falls? Yes   ASSISTIVE DEVICES UTILIZED TO PREVENT FALLS:  Life alert? No  Use of a cane, walker or w/c? No  Grab bars in the  bathroom? Yes  Shower  chair or bench in shower? No  Elevated toilet seat or a handicapped toilet? No   TIMED UP AND GO:  Was the test performed? No . Telephonic visit.   Cognitive Function: Normal cognitive status assessed by direct observation by this Nurse Health Advisor. No abnormalities found.       6CIT Screen 05/04/2019 04/24/2018 02/04/2017  What Year? 0 points 0 points 0 points  What month? 0 points 0 points 0 points  What time? 0 points 0 points 0 points  Count back from 20 0 points 0 points 0 points  Months in reverse 0 points 0 points 0 points  Repeat phrase 2 points 2 points 2 points  Total Score 2 2 2     Immunizations Immunization History  Administered Date(s) Administered  . PFIZER(Purple Top)SARS-COV-2 Vaccination 07/10/2019, 07/31/2019, 03/02/2020    TDAP status: Due, Education has been provided regarding the importance of this vaccine. Advised may receive this vaccine at local pharmacy or Health Dept. Aware to provide a copy of the vaccination record if obtained from local pharmacy or Health Dept. Verbalized acceptance and understanding.  Flu Vaccine status: Declined, Education has been provided regarding the importance of this vaccine but patient still declined. Advised may receive this vaccine at local pharmacy or Health Dept. Aware to provide a copy of the vaccination record if obtained from local pharmacy or Health Dept. Verbalized acceptance and understanding.  Pneumococcal vaccine status: Declined,  Education has been provided regarding the importance of this vaccine but patient still declined. Advised may receive this vaccine at local pharmacy or Health Dept. Aware to provide a copy of the vaccination record if obtained from local pharmacy or Health Dept. Verbalized acceptance and understanding.   Covid-19 vaccine status: Completed vaccines  Qualifies for Shingles Vaccine? Yes   Zostavax completed No   Shingrix Completed?: No.    Education has been  provided regarding the importance of this vaccine. Patient has been advised to call insurance company to determine out of pocket expense if they have not yet received this vaccine. Advised may also receive vaccine at local pharmacy or Health Dept. Verbalized acceptance and understanding.  Screening Tests Health Maintenance  Topic Date Due  . INFLUENZA VACCINE  08/03/2020 (Originally 12/05/2019)  . PNA vac Low Risk Adult (1 of 2 - PCV13) 09/14/2020 (Originally 09/19/2014)  . TETANUS/TDAP  09/25/2022 (Originally 09/18/1969)  . MAMMOGRAM  02/22/2021  . COLONOSCOPY (Pts 45-85yrs Insurance coverage will need to be confirmed)  05/10/2027  . DEXA SCAN  Completed  . COVID-19 Vaccine  Completed  . Hepatitis C Screening  Completed    Health Maintenance  There are no preventive care reminders to display for this patient.  Colorectal cancer screening: Type of screening: Colonoscopy. Completed 05/09/17. Repeat every 10 years  Mammogram status: Completed 02/23/20. Repeat every year  Bone Density status: Completed 02/23/20. Results reflect: Bone density results: OSTEOPOROSIS. Repeat every 2 years.  Lung Cancer Screening: (Low Dose CT Chest recommended if Age 10-80 years, 30 pack-year currently smoking OR have quit w/in 15years.) does not qualify.   Additional Screening:  Hepatitis C Screening: does qualify; Completed 07/25/15  Vision Screening: Recommended annual ophthalmology exams for early detection of glaucoma and other disorders of the eye. Is the patient up to date with their annual eye exam?  Yes  Who is the provider or what is the name of the office in which the patient attends annual eye exams? [place] [facility] Scre[facility]: Recommended annual dental exams for proper oral hygiene  Community Resource Referral / Chronic Care Management: CRR required this visit?  No   CCM required this visit?  No      Plan:     I have personally reviewed and noted the following in the patient's  chart:   . Medical and social history . Use of alcohol, tobacco or illicit drugs  . Current medications and supplements . Functional ability and status . Nutritional status . Physical activity . Advanced directives . List of other physicians . Hospitalizations, surgeries, and ER visits in previous 12 months . Vitals . Screenings to include cognitive, depression, and falls . Referrals and appointments  In addition, I have reviewed and discussed with patient certain preventive protocols, quality metrics, and best practice recommendations. A written personalized care plan for preventive services as well as general preventive health recommendations were provided to patient.     Clemetine Marker, LPN   [id number]/[id number]   Nurse Notes: pt states she has appt in [place] with [facility] on 07/04/20 for consult regarding knee pain and arthritis. She is trying to avoid knee replacement if possible. She is otherwise doing well and appreciative of visit today.

## 2020-07-04 ENCOUNTER — Ambulatory Visit: Payer: Medicare HMO | Admitting: Gastroenterology

## 2020-07-11 ENCOUNTER — Telehealth: Payer: Self-pay | Admitting: Gastroenterology

## 2020-07-11 NOTE — Telephone Encounter (Signed)
Patient called, needs to reschedule procedure. Patient asks that you call tomorrow.

## 2020-07-12 NOTE — Telephone Encounter (Signed)
Spoke with pt and was able to reschedule procedure to 08-07-20.

## 2020-07-20 ENCOUNTER — Telehealth: Payer: Self-pay | Admitting: Gastroenterology

## 2020-07-20 ENCOUNTER — Other Ambulatory Visit: Payer: Self-pay | Admitting: Family Medicine

## 2020-07-20 DIAGNOSIS — K219 Gastro-esophageal reflux disease without esophagitis: Secondary | ICD-10-CM

## 2020-07-20 NOTE — Telephone Encounter (Signed)
Her procedure in on 08/07/20

## 2020-07-20 NOTE — Telephone Encounter (Signed)
Patient called LVM- she does not understand her pre op instructions.  Please call to advise

## 2020-07-31 ENCOUNTER — Telehealth: Payer: Self-pay | Admitting: Gastroenterology

## 2020-07-31 NOTE — Telephone Encounter (Signed)
Doesn't understand her instructions for EGD and date of procedure.

## 2020-08-01 ENCOUNTER — Other Ambulatory Visit: Payer: Self-pay

## 2020-08-01 NOTE — Progress Notes (Signed)
Patient had questions regarding her covid test date. Verbally informed patient of the test date and times. Pt verbalized understanding.

## 2020-08-01 NOTE — Telephone Encounter (Signed)
Patient was never informed of updated instructions by previous CMA. Informed patient of EGD instructions and covid test date. Patient verbalized understanding.

## 2020-08-03 ENCOUNTER — Other Ambulatory Visit
Admission: RE | Admit: 2020-08-03 | Discharge: 2020-08-03 | Disposition: A | Discharge status: Home / Self Care | Payer: Medicare HMO | Source: Ambulatory Visit | Attending: Gastroenterology | Admitting: Gastroenterology

## 2020-08-03 ENCOUNTER — Other Ambulatory Visit: Payer: Self-pay

## 2020-08-03 DIAGNOSIS — Z20822 Contact with and (suspected) exposure to covid-19: Secondary | ICD-10-CM | POA: Insufficient documentation

## 2020-08-03 DIAGNOSIS — Z01812 Encounter for preprocedural laboratory examination: Secondary | ICD-10-CM | POA: Diagnosis not present

## 2020-08-03 LAB — SARS CORONAVIRUS 2 (TAT 6-24 HRS): SARS Coronavirus 2: NEGATIVE

## 2020-08-07 ENCOUNTER — Ambulatory Visit
Admission: RE | Admit: 2020-08-07 | Discharge: 2020-08-07 | Disposition: A | Discharge status: Home / Self Care | Payer: Medicare HMO | Attending: Gastroenterology | Admitting: Gastroenterology

## 2020-08-07 ENCOUNTER — Other Ambulatory Visit: Payer: Self-pay

## 2020-08-07 ENCOUNTER — Encounter
Admission: RE | Disposition: A | Discharge status: Home / Self Care | Payer: Self-pay | Source: Home / Self Care | Attending: Gastroenterology

## 2020-08-07 ENCOUNTER — Ambulatory Visit: Payer: Medicare HMO | Admitting: Anesthesiology

## 2020-08-07 ENCOUNTER — Encounter: Payer: Self-pay | Admitting: Gastroenterology

## 2020-08-07 DIAGNOSIS — Z90710 Acquired absence of both cervix and uterus: Secondary | ICD-10-CM | POA: Insufficient documentation

## 2020-08-07 DIAGNOSIS — Z888 Allergy status to other drugs, medicaments and biological substances status: Secondary | ICD-10-CM | POA: Diagnosis not present

## 2020-08-07 DIAGNOSIS — K229 Disease of esophagus, unspecified: Secondary | ICD-10-CM | POA: Insufficient documentation

## 2020-08-07 DIAGNOSIS — E7800 Pure hypercholesterolemia, unspecified: Secondary | ICD-10-CM | POA: Diagnosis not present

## 2020-08-07 DIAGNOSIS — B379 Candidiasis, unspecified: Secondary | ICD-10-CM | POA: Diagnosis not present

## 2020-08-07 DIAGNOSIS — Z833 Family history of diabetes mellitus: Secondary | ICD-10-CM | POA: Insufficient documentation

## 2020-08-07 DIAGNOSIS — E119 Type 2 diabetes mellitus without complications: Secondary | ICD-10-CM | POA: Diagnosis not present

## 2020-08-07 DIAGNOSIS — Z79899 Other long term (current) drug therapy: Secondary | ICD-10-CM | POA: Diagnosis not present

## 2020-08-07 DIAGNOSIS — R1310 Dysphagia, unspecified: Secondary | ICD-10-CM | POA: Diagnosis not present

## 2020-08-07 DIAGNOSIS — R634 Abnormal weight loss: Secondary | ICD-10-CM | POA: Diagnosis not present

## 2020-08-07 HISTORY — PX: ESOPHAGOGASTRODUODENOSCOPY (EGD) WITH PROPOFOL: SHX5813

## 2020-08-07 LAB — KOH PREP

## 2020-08-07 SURGERY — ESOPHAGOGASTRODUODENOSCOPY (EGD) WITH PROPOFOL
Anesthesia: General

## 2020-08-07 MED ORDER — SODIUM CHLORIDE 0.9 % IV SOLN: INTRAVENOUS | Status: DC

## 2020-08-07 MED ORDER — PROPOFOL 500 MG/50ML IV EMUL
INTRAVENOUS | Status: DC | PRN
  Administered 2020-08-07: 120 ug/kg/min via INTRAVENOUS

## 2020-08-07 MED ORDER — PROPOFOL 500 MG/50ML IV EMUL
INTRAVENOUS | Status: AC
  Filled 2020-08-07: qty 150

## 2020-08-07 MED ORDER — LIDOCAINE HCL (PF) 2 % IJ SOLN
INTRAMUSCULAR | Status: AC
  Filled 2020-08-07: qty 5

## 2020-08-07 MED ORDER — PROPOFOL 10 MG/ML IV BOLUS
INTRAVENOUS | Status: DC | PRN
  Administered 2020-08-07: 70 mg via INTRAVENOUS

## 2020-08-07 MED ORDER — LIDOCAINE 2% (20 MG/ML) 5 ML SYRINGE
INTRAMUSCULAR | Status: DC | PRN
  Administered 2020-08-07: 50 mg via INTRAVENOUS

## 2020-08-07 MED ORDER — GLYCOPYRROLATE 0.2 MG/ML IJ SOLN
INTRAMUSCULAR | Status: AC
  Filled 2020-08-07: qty 1

## 2020-08-07 NOTE — Op Note (Signed)
[facility] Gastroenterology Patient Name: Ebony [name] Procedure Date: 08/07/2020 7:45 AM MRN: [id number] Account #: [id number] Date of Birth: June 15, 1950 Admit Type: Outpatient Age: 70 Room: [facility] ENDO ROOM 2 Gender: Female Note Status: Finalized Procedure:             Upper GI endoscopy Indications:           Dysphagia Providers:             Jonathon Bellows MD, MD Referring MD:          Aretha Parrot. Tapson (Referring MD) Medicines:             Monitored Anesthesia Care Complications:         No immediate complications. Procedure:             Pre-Anesthesia Assessment:                        - Prior to the procedure, a History and Physical was                         performed, and patient medications, allergies and                         sensitivities were reviewed. The patient's tolerance                         of previous anesthesia was reviewed.                        - The risks and benefits of the procedure and the                         sedation options and risks were discussed with the                         patient. All questions were answered and informed                         consent was obtained.                        - ASA Grade Assessment: II - A patient with mild                         systemic disease.                        After obtaining informed consent, the endoscope was                         passed under direct vision. Throughout the procedure,                         the patient's blood pressure, pulse, and oxygen                         saturations were monitored continuously. The Endoscope                         was introduced through the mouth, and advanced to  the                         third part of duodenum. The upper GI endoscopy was                         accomplished with ease. The patient tolerated the                         procedure well. Findings:      Patchy, white plaques were found in the mid esophagus. Brushings for  KOH       prep were obtained in the middle third of the esophagus.      There is no endoscopic evidence of stricture in the entire esophagus.      The stomach was normal.      The cardia and gastric fundus were normal on retroflexion.      The examined duodenum was normal. Impression:            - Esophageal plaques were found, suspicious for                         candidiasis. Brushings performed.                        - Normal stomach.                        - Normal examined duodenum. Recommendation:        - Discharge patient to home (with escort).                        - Resume previous diet.                        - Continue present medications.                        - Await pathology results.                        - Return to my office as previously scheduled. Procedure Code(s):     --- Professional ---                        [phone], Esophagogastroduodenoscopy, flexible,                         transoral; diagnostic, including collection of                         specimen(s) by brushing or washing, when performed                         (separate procedure) Diagnosis Code(s):     --- Professional ---                        K22.9, Disease of esophagus, unspecified                        R13.10, Dysphagia, unspecified CPT copyright 2019 American Medical Association. All rights reserved. The codes documented in this report  are preliminary and upon coder review may  be revised to meet current compliance requirements. Jonathon Bellows, MD Jonathon Bellows MD, MD 08/07/2020 7:56:27 AM This report has been signed electronically. Number of Addenda: 0 Note Initiated On: 08/07/2020 7:45 AM Estimated Blood Loss:  Estimated blood loss: none.      [facility]

## 2020-08-07 NOTE — Anesthesia Preprocedure Evaluation (Signed)
Anesthesia Evaluation  Patient identified by MRN, date of birth, ID band Patient awake  General Assessment Comment:States it took her "a long time to wake up" once , but previous EGD anesthetic went well  Reviewed: Allergy & Precautions, H&P , NPO status , Patient's Chart, lab work & pertinent test results, reviewed documented beta blocker date and time   History of Anesthesia Complications (+) PROLONGED EMERGENCE and history of anesthetic complications  Airway Mallampati: I  TM Distance: >3 FB Neck ROM: full    Dental  (+) Edentulous Upper, Edentulous Lower   Pulmonary neg pulmonary ROS, neg sleep apnea, neg COPD, Patient abstained from smoking.Not current smoker,    Pulmonary exam normal breath sounds clear to auscultation       Cardiovascular Exercise Tolerance: Good METS(-) hypertension(-) CAD and (-) Past MI negative cardio ROS Normal cardiovascular exam(-) dysrhythmias  Rhythm:Regular Rate:Normal  02/2019 ECHO 1. Left ventricular ejection fraction, by visual estimation, is 50 to  55%. The left ventricle has normal function. There is no left ventricular  hypertrophy.  2. Global right ventricle has normal systolic function.The right  ventricular size is normal. No increase in right ventricular wall  thickness.  3. Left atrial size was normal.  4. Right atrial size was normal.  5. The mitral valve is normal in structure. Mild mitral valve  regurgitation.  6. The tricuspid valve is normal in structure. Tricuspid valve  regurgitation is trivial.  7. The aortic valve is normal in structure. Aortic valve regurgitation is  trivial by color flow Doppler.  8. The pulmonic valve was grossly normal. Pulmonic valve regurgitation is  trivial by color flow Doppler.    Neuro/Psych negative neurological ROS  negative psych ROS   GI/Hepatic GERD  Medicated,(+)     (-) substance abuse  ,   Endo/Other  neg diabetes   Renal/GU negative Renal ROS     Musculoskeletal   Abdominal   Peds  Hematology  (+) Blood dyscrasia, anemia ,   Anesthesia Other Findings Past Medical History: No date: Allergy No date: Anemia No date: Arthritis     Comment:  knees, hands No date: Borderline diabetes mellitus No date: Complication of anesthesia     Comment:  slow to wake No date: Dry eye syndrome of both eyes No date: Hypercholesterolemia 12/17/2016: Prehypertension No date: Wears dentures     Comment:  full upper and lower  Past Surgical History: No date: ABDOMINAL HYSTERECTOMY     Comment:  complete 05/09/2017: COLONOSCOPY WITH PROPOFOL; N/A     Comment:  Procedure: COLONOSCOPY WITH PROPOFOL;  Surgeon: Lucilla Lame, MD;  Location: [facility];  Service:               Endoscopy;  Laterality: N/A; 08/04/2017: EYE SURGERY No date: KNEE SURGERY; Bilateral     Reproductive/Obstetrics                             Anesthesia Physical  Anesthesia Plan  ASA: II  Anesthesia Plan: General   Post-op Pain Management:    Induction: Intravenous  PONV Risk Score and Plan: 3 and Treatment may vary due to age or medical condition, TIVA and Propofol infusion  Airway Management Planned: Nasal Cannula and Natural Airway  Additional Equipment: None  Intra-op Plan:   Post-operative Plan:   Informed Consent: I have reviewed the patients History and  Physical, chart, labs and discussed the procedure including the risks, benefits and alternatives for the proposed anesthesia with the patient or authorized representative who has indicated his/her understanding and acceptance.     Dental Advisory Given  Plan Discussed with: CRNA  Anesthesia Plan Comments: (Discussed risks of anesthesia with patient, including possibility of difficulty with spontaneous ventilation under anesthesia necessitating airway intervention, PONV, and rare risks such as cardiac or respiratory  or neurological events. Patient understands.)        Anesthesia Quick Evaluation

## 2020-08-07 NOTE — Transfer of Care (Signed)
Immediate Anesthesia Transfer of Care Note  Patient: Ebony [name]  Procedure(s) Performed: ESOPHAGOGASTRODUODENOSCOPY (EGD) WITH PROPOFOL (N/A )  Patient Location: Endoscopy Unit  Anesthesia Type:General  Level of Consciousness: awake  Airway & Oxygen Therapy: Patient Spontanous Breathing  Post-op Assessment: Report given to RN and Post -op Vital signs reviewed and stable  Post vital signs: Reviewed  Last Vitals:  Vitals Value Taken Time  BP 108/71 08/07/20 0758  Temp    Pulse 60 08/07/20 0759  Resp 12 08/07/20 0759  SpO2 100 % 08/07/20 0759  Vitals shown include unvalidated device data.  Last Pain:  Vitals:   08/07/20 0707  TempSrc: Temporal  PainSc: 0-No pain         Complications: No complications documented.

## 2020-08-07 NOTE — Anesthesia Postprocedure Evaluation (Signed)
Anesthesia Post Note  Patient: [name]  Procedure(s) Performed: ESOPHAGOGASTRODUODENOSCOPY (EGD) WITH PROPOFOL (N/A )  Patient location during evaluation: Endoscopy Anesthesia Type: General Level of consciousness: awake and alert Pain management: pain level controlled Vital Signs Assessment: post-procedure vital signs reviewed and stable Respiratory status: spontaneous breathing, nonlabored ventilation, respiratory function stable and patient connected to nasal cannula oxygen Cardiovascular status: blood pressure returned to baseline and stable Postop Assessment: no apparent nausea or vomiting Anesthetic complications: no   No complications documented.   Last Vitals:  Vitals:   08/07/20 0819 08/07/20 0829  BP: 102/73 128/77  Pulse: (!) 50 (!) 50  Resp: 15 14  Temp:    SpO2: 100% 100%    Last Pain:  Vitals:   08/07/20 0829  TempSrc:   PainSc: 0-No pain                 Arita Miss

## 2020-08-07 NOTE — H&P (Signed)
Jonathon Bellows, MD [address], [place], [place], [place], [address] [address], [place], [place], [place], [address] Phone: [phone]  Fax: [phone]  Primary Care Physician:  Delsa Grana, PA-C   Pre-Procedure History & Physical: HPI:  Ebony [name] is a 70 y.o. female is here for an endoscopy    Past Medical History:  Diagnosis Date  . Allergy   . Anemia   . Arthritis    knees, hands  . Borderline diabetes mellitus   . Complication of anesthesia    slow to wake  . Dry eye syndrome of both eyes   . GERD (gastroesophageal reflux disease)   . Hypercholesterolemia   . Prehypertension 12/17/2016  . Wears dentures    full upper and lower    Past Surgical History:  Procedure Laterality Date  . ABDOMINAL HYSTERECTOMY     complete  . COLONOSCOPY WITH PROPOFOL N/A 05/09/2017   Procedure: COLONOSCOPY WITH PROPOFOL;  Surgeon: Lucilla Lame, MD;  Location: [facility];  Service: Endoscopy;  Laterality: N/A;  . ESOPHAGOGASTRODUODENOSCOPY (EGD) WITH PROPOFOL N/A 05/30/2020   Procedure: ESOPHAGOGASTRODUODENOSCOPY (EGD) WITH PROPOFOL;  Surgeon: Jonathon Bellows, MD;  Location: [facility] ENDOSCOPY;  Service: Gastroenterology;  Laterality: N/A;  . EYE SURGERY  08/04/2017  . KNEE SURGERY Bilateral     Prior to Admission medications   Medication Sig Start Date End Date Taking? Authorizing Provider  acetaminophen (TYLENOL) 500 MG tablet Take 1-2 tablets (500-1,000 mg total) by mouth every 8 (eight) hours as needed for moderate pain. OTC 05/02/20  Yes Delsa Grana, PA-C  fluticasone (FLONASE) 50 MCG/ACT nasal spray SPRAY 2 SPRAYS INTO EACH NOSTRIL EVERY DAY 02/08/20  Yes Delsa Grana, PA-C  levocetirizine (XYZAL) 5 MG tablet levocetirizine 5 mg tablet   Yes [provider]  nystatin (MYCOSTATIN) 100000 UNIT/ML suspension USE AS DIRECTED 5 MLS (500,000 UNITS TOTAL) IN THE MOUTH OR THROAT 4 (FOUR) TIMES DAILY. 06/26/20  Yes Jacquelin Hawking, NP  REFRESH CELLUVISC 1 % GEL   07/19/19  Yes [provider]  RESTASIS 0.05 % ophthalmic emulsion Instill 1 drop into both eyes twice a day 03/08/20  Yes [provider]  Calcium Carbonate-Vit D-Min (CALCIUM 1200 PO) Take 600 mg by mouth daily.     [provider]  Cholecalciferol (VITAMIN D) 125 MCG (5000 UT) CAPS Take 1 capsule by mouth daily.    [provider]  Cyanocobalamin (VITAMIN B-12) 1000 MCG SUBL Place 1,000 mcg under the tongue daily.     [provider]  pantoprazole (PROTONIX) 40 MG tablet Take 1 tablet (40 mg total) by mouth daily. For GERD/acid reflux 05/02/20   Delsa Grana, PA-C  rosuvastatin (CRESTOR) 10 MG tablet Take 10 mg by mouth at bedtime. 04/25/20   [provider]    Allergies as of 06/12/2020 - Review Complete 05/30/2020  Allergen Reaction Noted  . Celecoxib Anxiety and Itching 07/11/2015    Family History  Problem Relation Age of Onset  . Hypertension Mother   . Stroke Mother        4 mini strokes  . Thyroid disease Mother   . Kidney failure Father   . Diabetes Father   . Hypertension Father   . Kidney disease Father   . Emphysema Maternal Grandmother   . Diabetes Maternal Grandmother   . Hypertension Maternal Grandmother   . Diabetes Maternal Grandfather   . Alcohol abuse Maternal Grandfather   . Diabetes Paternal Grandmother   . Heart attack  Paternal Grandmother   . Emphysema Paternal Grandfather   . Diabetes Paternal Grandfather   . Heart disease Paternal Grandfather   . Thyroid disease Sister   . Vitamin D deficiency Sister   . Arthritis Sister   . COPD Sister   . Fibromyalgia Sister   . Hypertension Sister   . Hyperlipidemia Sister   . Asthma Sister   . Thyroid disease Sister   . Breast cancer Other   . Cancer Neg Hx     Social History   Socioeconomic History  . Marital status: Married    Spouse name: Not on file  . Number of children: 3  . Years of education: Not on file  . Highest education level: High  school graduate  Occupational History  . Not on file  Tobacco Use  . Smoking status: Never Smoker  . Smokeless tobacco: Never Used  Vaping Use  . Vaping Use: Never used  Substance and Sexual Activity  . Alcohol use: No  . Drug use: No  . Sexual activity: Not Currently  Other Topics Concern  . Not on file  Social History Narrative  . Not on file   Social Determinants of Health   Financial Resource Strain: Low Risk   . Difficulty of Paying Living Expenses: Not hard at all  Food Insecurity: No Food Insecurity  . Worried About Charity fundraiser in the Last Year: Never true  . Ran Out of Food in the Last Year: Never true  Transportation Needs: No Transportation Needs  . Lack of Transportation (Medical): No  . Lack of Transportation (Non-Medical): No  Physical Activity: Sufficiently Active  . Days of Exercise per Week: 7 days  . Minutes of Exercise per Session: 60 min  Stress: No Stress Concern Present  . Feeling of Stress : Not at all  Social Connections: Moderately Integrated  . Frequency of Communication with Friends and Family: More than three times a week  . Frequency of Social Gatherings with Friends and Family: More than three times a week  . Attends Religious Services: More than 4 times per year  . Active Member of Clubs or Organizations: No  . Attends Archivist Meetings: Never  . Marital Status: Married  Human resources officer Violence: Not At Risk  . Fear of Current or Ex-Partner: No  . Emotionally Abused: No  . Physically Abused: No  . Sexually Abused: No    Review of Systems: See HPI, otherwise negative ROS  Physical Exam: BP 113/71   Pulse 63   Temp (!) 96.9 F (36.1 C) (Temporal)   Resp 16   Ht 5' 7.5" (1.715 m)   Wt 61.2 kg   SpO2 100%   BMI 20.83 kg/m  General:   Alert,  pleasant and cooperative in NAD Head:  Normocephalic and atraumatic. Neck:  Supple; no masses or thyromegaly. Lungs:  Clear throughout to auscultation, normal  respiratory effort.    Heart:  +S1, +S2, Regular rate and rhythm, No edema. Abdomen:  Soft, nontender and nondistended. Normal bowel sounds, without guarding, and without rebound.   Neurologic:  Alert and  oriented x4;  grossly normal neurologically.  Impression/Plan: Ebony [name] is here for an endoscopy  to be performed for  evaluation of dysphagia    Risks, benefits, limitations, and alternatives regarding endoscopy have been reviewed with the patient.  Questions have been answered.  All parties agreeable.   Jonathon Bellows, MD  08/07/2020, 7:44 AM

## 2020-08-08 ENCOUNTER — Encounter: Payer: Self-pay | Admitting: Gastroenterology

## 2020-08-09 ENCOUNTER — Other Ambulatory Visit: Admission: RE | Admit: 2020-08-09 | Payer: Medicare HMO | Source: Ambulatory Visit

## 2020-08-31 ENCOUNTER — Other Ambulatory Visit: Payer: Self-pay

## 2020-08-31 DIAGNOSIS — J324 Chronic pansinusitis: Secondary | ICD-10-CM

## 2020-08-31 DIAGNOSIS — H6983 Other specified disorders of Eustachian tube, bilateral: Secondary | ICD-10-CM

## 2020-08-31 MED ORDER — FLUTICASONE PROPIONATE 50 MCG/ACT NA SUSP: 2.0000 | Freq: Every day | NASAL | 1 refills | Status: DC | PRN

## 2020-09-08 ENCOUNTER — Other Ambulatory Visit: Payer: Self-pay

## 2020-09-08 ENCOUNTER — Ambulatory Visit
Admission: EM | Admit: 2020-09-08 | Discharge: 2020-09-08 | Disposition: A | Discharge status: Home / Self Care | Payer: Medicare HMO | Attending: Emergency Medicine | Admitting: Emergency Medicine

## 2020-09-08 DIAGNOSIS — R0982 Postnasal drip: Secondary | ICD-10-CM

## 2020-09-08 DIAGNOSIS — T161XXA Foreign body in right ear, initial encounter: Secondary | ICD-10-CM

## 2020-09-08 DIAGNOSIS — J309 Allergic rhinitis, unspecified: Secondary | ICD-10-CM | POA: Diagnosis not present

## 2020-09-08 MED ORDER — IPRATROPIUM BROMIDE 0.06 % NA SOLN: 2.0000 | Freq: Four times a day (QID) | NASAL | 12 refills | Status: DC

## 2020-09-08 NOTE — Discharge Instructions (Addendum)
Use the Atrovent nasal spray, 2 squirts in each nostril every 6 hours, as needed for runny nose and postnasal drip.  Perform sinus irrigation with a NeilMed sinus rinse kit and distilled water 2-3 times a day to wash and wane environmental allergens that are causing your runny nose, postnasal drip, and cough.  Continue your Xyzal and Flonase.  Return for reevaluation or see your primary care provider for any new or worsening symptoms.

## 2020-09-08 NOTE — ED Triage Notes (Signed)
Patient complains of nasal congestion, sinus pain and pressure x 1 week. Patient states that has been using flonase and allergy medicine without relief. Patient states that she has been noticing some pain in right ear pain.

## 2020-09-08 NOTE — ED Provider Notes (Signed)
[facility]    CSN: [id number] Arrival date & time: 09/08/20  1806      History   Chief Complaint Chief Complaint  Patient presents with  . Nasal Congestion    HPI Ebony [name] is a 70 y.o. female.   HPI   70 year old female here for evaluation of right ear pain.  Patient reports that she has been experiencing pain in her right ear for the last 2 to 3 days with some intermittent ringing.  She is also had nasal congestion with a runny nose and a green nasal discharge, scratchy throat, cough, shortness of breath, and fatigue.  Patient denies change in hearing, drainage from ear, or wheezing.  Patient has a long history of allergies and has been complaining that her ears have been itching and using Q-tips in her years.  She is also been using her Xyzal and Flonase for her allergy symptoms with little relief.  Past Medical History:  Diagnosis Date  . Allergy   . Anemia   . Arthritis    knees, hands  . Borderline diabetes mellitus   . Complication of anesthesia    slow to wake  . Dry eye syndrome of both eyes   . GERD (gastroesophageal reflux disease)   . Hypercholesterolemia   . Prehypertension 12/17/2016  . Wears dentures    full upper and lower    Patient Active Problem List   Diagnosis Date Noted  . Dysphagia 06/12/2020  . Gastroesophageal reflux disease 05/02/2020  . Palpitation 09/29/2019  . Anemia, unspecified 09/25/2019  . Chest pain 02/25/2019  . B12 deficiency 12/30/2018  . Hyperlipidemia 07/01/2018  . Prediabetes 07/01/2018  . Neutropenia, unspecified type ([place]) 12/18/2016  . Loss of weight 12/17/2016  . Adjustment disorder with mixed anxiety and depressed mood 10/11/2015  . Heartburn 10/11/2015  . Arthritis of knee, degenerative 07/18/2015    Past Surgical History:  Procedure Laterality Date  . ABDOMINAL HYSTERECTOMY     complete  . COLONOSCOPY WITH PROPOFOL N/A 05/09/2017   Procedure: COLONOSCOPY WITH PROPOFOL;  Surgeon: Lucilla Lame,  MD;  Location: [place];  Service: Endoscopy;  Laterality: N/A;  . ESOPHAGOGASTRODUODENOSCOPY (EGD) WITH PROPOFOL N/A 05/30/2020   Procedure: ESOPHAGOGASTRODUODENOSCOPY (EGD) WITH PROPOFOL;  Surgeon: Jonathon Bellows, MD;  Location: [facility] ENDOSCOPY;  Service: Gastroenterology;  Laterality: N/A;  . ESOPHAGOGASTRODUODENOSCOPY (EGD) WITH PROPOFOL N/A 08/07/2020   Procedure: ESOPHAGOGASTRODUODENOSCOPY (EGD) WITH PROPOFOL;  Surgeon: Jonathon Bellows, MD;  Location: [facility] ENDOSCOPY;  Service: Gastroenterology;  Laterality: N/A;  . EYE SURGERY  08/04/2017  . KNEE SURGERY Bilateral     OB History   No obstetric history on file.      Home Medications    Prior to Admission medications   Medication Sig Start Date End Date Taking? Authorizing Provider  acetaminophen (TYLENOL) 500 MG tablet Take 1-2 tablets (500-1,000 mg total) by mouth every 8 (eight) hours as needed for moderate pain. OTC 05/02/20  Yes Delsa Grana, PA-C  Calcium Carbonate-Vit D-Min (CALCIUM 1200 PO) Take 600 mg by mouth daily.    Yes [provider]  Cholecalciferol (VITAMIN D) 125 MCG (5000 UT) CAPS Take 1 capsule by mouth daily.   Yes [provider]  Cyanocobalamin (VITAMIN B-12) 1000 MCG SUBL Place 1,000 mcg under the tongue daily.    Yes [provider]  fluticasone (FLONASE) 50 MCG/ACT nasal spray Place 2 sprays into both nostrils daily as needed for allergies or rhinitis. 08/31/20  Yes Delsa Grana, PA-C  ipratropium (ATROVENT) 0.06 %  nasal spray Place 2 sprays into both nostrils 4 (four) times daily. 09/08/20  Yes Margarette Canada, NP  levocetirizine (XYZAL) 5 MG tablet levocetirizine 5 mg tablet   Yes [provider]  nystatin (MYCOSTATIN) 100000 UNIT/ML suspension USE AS DIRECTED 5 MLS (500,000 UNITS TOTAL) IN THE MOUTH OR THROAT 4 (FOUR) TIMES DAILY. 06/26/20  Yes Jacquelin Hawking, NP  pantoprazole (PROTONIX) 40 MG tablet Take 1 tablet (40 mg total) by mouth daily. For GERD/acid reflux 05/02/20   Yes Delsa Grana, PA-C  REFRESH CELLUVISC 1 % GEL  07/19/19  Yes [provider]  RESTASIS 0.05 % ophthalmic emulsion Instill 1 drop into both eyes twice a day 03/08/20  Yes [provider]  rosuvastatin (CRESTOR) 10 MG tablet Take 10 mg by mouth at bedtime. 04/25/20  Yes [provider]    Family History Family History  Problem Relation Age of Onset  . Hypertension Mother   . Stroke Mother        4 mini strokes  . Thyroid disease Mother   . Kidney failure Father   . Diabetes Father   . Hypertension Father   . Kidney disease Father   . Emphysema Maternal Grandmother   . Diabetes Maternal Grandmother   . Hypertension Maternal Grandmother   . Diabetes Maternal Grandfather   . Alcohol abuse Maternal Grandfather   . Diabetes Paternal Grandmother   . Heart attack Paternal Grandmother   . Emphysema Paternal Grandfather   . Diabetes Paternal Grandfather   . Heart disease Paternal Grandfather   . Thyroid disease Sister   . Vitamin D deficiency Sister   . Arthritis Sister   . COPD Sister   . Fibromyalgia Sister   . Hypertension Sister   . Hyperlipidemia Sister   . Asthma Sister   . Thyroid disease Sister   . Breast cancer Other   . Cancer Neg Hx     Social History Social History   Tobacco Use  . Smoking status: Never Smoker  . Smokeless tobacco: Never Used  Vaping Use  . Vaping Use: Never used  Substance Use Topics  . Alcohol use: No  . Drug use: No     Allergies   Celecoxib   Review of Systems Review of Systems  Constitutional: Negative for activity change, appetite change and fever.  HENT: Positive for congestion, ear pain, rhinorrhea, sore throat and tinnitus.   Respiratory: Positive for cough, shortness of breath and wheezing.   Musculoskeletal: Negative for arthralgias and myalgias.  Skin: Negative for rash.  Hematological: Negative.   Psychiatric/Behavioral: Negative.      Physical Exam Triage Vital Signs ED Triage Vitals   Enc Vitals Group     BP      Pulse      Resp      Temp      Temp src      SpO2      Weight      Height      Head Circumference      Peak Flow      Pain Score      Pain Loc      Pain Edu?      Excl. in [place]?    No data found.  Updated Vital Signs BP (!) 143/87 (BP Location: Left Arm)   Pulse 65   Temp 98.4 F (36.9 C) (Oral)   Resp 18   Ht 5' 7.5" (1.715 m)   Wt 135 lb (61.2 kg)   SpO2  99%   BMI 20.83 kg/m   Visual Acuity Right Eye Distance:   Left Eye Distance:   Bilateral Distance:    Right Eye Near:   Left Eye Near:    Bilateral Near:     Physical Exam Vitals and nursing note reviewed.  Constitutional:      General: She is not in acute distress.    Appearance: Normal appearance. She is not ill-appearing.  HENT:     Head: Normocephalic and atraumatic.     Right Ear: Tympanic membrane and external ear normal.     Left Ear: Tympanic membrane, ear canal and external ear normal. There is no impacted cerumen.     Nose: Congestion and rhinorrhea present.     Mouth/Throat:     Mouth: Mucous membranes are moist.     Pharynx: Oropharynx is clear. Posterior oropharyngeal erythema present.  Cardiovascular:     Rate and Rhythm: Normal rate and regular rhythm.     Pulses: Normal pulses.     Heart sounds: Normal heart sounds. No murmur heard. No gallop.   Pulmonary:     Effort: Pulmonary effort is normal.     Breath sounds: Normal breath sounds. No wheezing, rhonchi or rales.  Musculoskeletal:     Cervical back: Normal range of motion and neck supple.  Lymphadenopathy:     Cervical: No cervical adenopathy.  Skin:    General: Skin is warm and dry.     Capillary Refill: Capillary refill takes less than 2 seconds.     Findings: No erythema or rash.  Neurological:     General: No focal deficit present.     Mental Status: She is alert and oriented to person, place, and time.  Psychiatric:        Mood and Affect: Mood normal.        Behavior: Behavior normal.         Thought Content: Thought content normal.        Judgment: Judgment normal.      UC Treatments / Results  Labs (all labs ordered are listed, but only abnormal results are displayed) Labs Reviewed - No data to display  EKG   Radiology No results found.  Procedures Foreign Body Removal  Date/Time: 09/08/2020 6:44 PM Performed by: Margarette Canada, NP Authorized by: Margarette Canada, NP   Consent:    Consent obtained:  Verbal   Consent given by:  Patient   Risks discussed:  Bleeding, pain, incomplete removal and worsening of condition Location:    Location:  Ear   Ear location:  R ear Pre-procedure details:    Imaging:  None Anesthesia:    Anesthesia method:  None Procedure details:    Removal mechanism:  Forceps   Foreign bodies recovered:  1   Description:  Tip of a cotton-tipped applicator   Intact foreign body removal: yes   Post-procedure details:    Confirmation:  No additional foreign bodies on visualization   Skin closure:  None   Dressing:  Open (no dressing)   Procedure completion:  Tolerated   (including critical care time)  Medications Ordered in UC Medications - No data to display  Initial Impression / Assessment and Plan / UC Course  I have reviewed the triage vital signs and the nursing notes.  Pertinent labs & imaging results that were available during my care of the patient were reviewed by me and considered in my medical decision making (see chart for details).   Patient is a very  pleasant, nontoxic-appearing 41-year-old female here for evaluation of right ear pain this been going for last 3 days.  Additionally, she has been experiencing runny nose, nasal discharge, sore throat, productive cough, shortness of breath, and fatigue.  Physical exam reveals pearly gray left tympanic membrane with a normal light reflex and clear external auditory canal.  Right external auditory canal is occluded by cotton swab.  The swab was easily removed with a pair of  alligator forceps to reveal a clear external auditory canal and a pearly gray tympanic membrane with normal light reflex.  Nasal mucosa is pale and edematous with clear nasal discharge.  Posterior oropharynx has erythema and clear postnasal drip without injection or exudate.  No cervical lymphadenopathy appreciated exam.  Lungs clear to auscultation all fields.  Will discharge patient home with a diagnosis of rhinitis with postnasal drip and have her continue her Xyzal and Flonase.  We will add on Atrovent nasal spray and sinus irrigation.   Final Clinical Impressions(s) / UC Diagnoses   Final diagnoses:  Allergic rhinitis with postnasal drip  Foreign body of right ear, initial encounter     Discharge Instructions     Use the Atrovent nasal spray, 2 squirts in each nostril every 6 hours, as needed for runny nose and postnasal drip.  Perform sinus irrigation with a [employer] sinus rinse kit and distilled water 2-3 times a day to wash and wane environmental allergens that are causing your runny nose, postnasal drip, and cough.  Continue your Xyzal and Flonase.  Return for reevaluation or see your primary care provider for any new or worsening symptoms.     ED Prescriptions    Medication Sig Dispense Auth. Provider   ipratropium (ATROVENT) 0.06 % nasal spray Place 2 sprays into both nostrils 4 (four) times daily. 15 mL Margarette Canada, NP     PDMP not reviewed this encounter.   Margarette Canada, NP 09/08/20 [phone]

## 2020-09-20 DIAGNOSIS — H16223 Keratoconjunctivitis sicca, not specified as Sjogren's, bilateral: Secondary | ICD-10-CM | POA: Diagnosis not present

## 2020-11-01 ENCOUNTER — Ambulatory Visit: Payer: Medicare HMO | Admitting: Family Medicine

## 2020-12-08 NOTE — Progress Notes (Signed)
[place]  [phone] [phone]  ID: Ebony [name] OB: 1950-11-23  MR#: [id number]  TXM#:[id number]  Patient Care Team: Delsa Grana, PA-C as PCP - General (Family Medicine) Lloyd Huger, MD as Consulting Physician (Oncology)  CHIEF COMPLAINT: Leukopenia.  INTERVAL HISTORY: Patient returns to clinic today for repeat laboratory work and routine 41-monthevaluation.  She continues to feel well and remains asymptomatic.  She denies any recent fevers or illnesses.  She has no neurologic complaints.  She has a good appetite and denies weight loss.  She has no chest pain, shortness of breath, cough, or hemoptysis.  She denies any nausea, vomiting, constipation, or diarrhea.  She has no urinary complaints.  Patient feels at her baseline offers no specific complaints today.  REVIEW OF SYSTEMS:   Review of Systems  Constitutional: Negative.  Negative for fever, malaise/fatigue and weight loss.  Respiratory: Negative.  Negative for cough, hemoptysis and shortness of breath.   Cardiovascular: Negative.  Negative for chest pain and leg swelling.  Gastrointestinal: Negative.  Negative for abdominal pain.  Genitourinary: Negative.  Negative for dysuria.  Musculoskeletal: Negative.  Negative for back pain.  Skin: Negative.  Negative for rash.  Neurological: Negative.  Negative for dizziness, focal weakness, weakness and headaches.  Psychiatric/Behavioral:  The patient is not nervous/anxious.    As per HPI. Otherwise, a complete review of systems is negative.  PAST MEDICAL HISTORY: Past Medical History:  Diagnosis Date   Allergy    Anemia    Arthritis    knees, hands   Borderline diabetes mellitus    Complication of anesthesia    slow to wake   Dry eye syndrome of both eyes    GERD (gastroesophageal reflux disease)    Hypercholesterolemia    Prehypertension 12/17/2016   Wears dentures    full upper and lower    PAST SURGICAL  HISTORY: Past Surgical History:  Procedure Laterality Date   ABDOMINAL HYSTERECTOMY     complete   COLONOSCOPY WITH PROPOFOL N/A 05/09/2017   Procedure: COLONOSCOPY WITH PROPOFOL;  Surgeon: WLucilla Lame MD;  Location[place] [facility]  Service: Endoscopy;  Laterality: N/A;   ESOPHAGOGASTRODUODENOSCOPY (EGD) WITH PROPOFOL N/A 05/30/2020   Procedure: ESOPHAGOGASTRODUODENOSCOPY (EGD) WITH PROPOFOL;  Surgeon: AJonathon Bellows MD;  [facility];  Service: Gastroenterology;  Laterality: N/A;   ESOPHAGOGASTRODUODENOSCOPY (EGD) WITH PROPOFOL N/A 08/07/2020   Procedure: ESOPHAGOGASTRODUODENOSCOPY (EGD) WITH PROPOFOL;  Surgeon: AJonathon Bellows MD;  [facility];  Service: Gastroenterology;  Laterality: N/A;   EYE SURGERY  08/04/2017   KNEE SURGERY Bilateral     FAMILY HISTORY: Family History  Problem Relation Age of Onset   Hypertension Mother    Stroke Mother        4 mini strokes   Thyroid disease Mother    Kidney failure Father    Diabetes Father    Hypertension Father    Kidney disease Father    Emphysema Maternal Grandmother    Diabetes Maternal Grandmother    Hypertension Maternal Grandmother    Diabetes Maternal Grandfather    Alcohol abuse Maternal Grandfather    Diabetes Paternal Grandmother    Heart attack Paternal Grandmother    Emphysema Paternal Grandfather    Diabetes Paternal Grandfather    Heart disease Paternal Grandfather    Thyroid disease Sister    Vitamin D deficiency Sister    Arthritis Sister    COPD Sister    Fibromyalgia Sister    Hypertension Sister  Hyperlipidemia Sister    Asthma Sister    Thyroid disease Sister    Breast cancer Other    Cancer Neg Hx     ADVANCED DIRECTIVES (Y/N):  N  HEALTH MAINTENANCE: Social History   Tobacco Use   Smoking status: Never   Smokeless tobacco: Never  Vaping Use   Vaping Use: Never used  Substance Use Topics   Alcohol use: No   Drug use: No     Colonoscopy:  PAP:  Bone  density:  Lipid panel:  Allergies  Allergen Reactions   Celecoxib Anxiety and Itching    Current Outpatient Medications  Medication Sig Dispense Refill   acetaminophen (TYLENOL) 500 MG tablet Take 1-2 tablets (500-1,000 mg total) by mouth every 8 (eight) hours as needed for moderate pain. OTC 30 tablet 0   Calcium Carbonate-Vit D-Min (CALCIUM 1200 PO) Take 600 mg by mouth daily.      Cholecalciferol (VITAMIN D) 125 MCG (5000 UT) CAPS Take 1 capsule by mouth daily.     CINNAMON PO Take by mouth.     Cyanocobalamin (VITAMIN B-12) 1000 MCG SUBL Place 1,000 mcg under the tongue daily.      fluticasone (FLONASE) 50 MCG/ACT nasal spray Place 2 sprays into both nostrils daily as needed for allergies or rhinitis. 48 mL 1   levocetirizine (XYZAL) 5 MG tablet levocetirizine 5 mg tablet     nystatin (MYCOSTATIN) 100000 UNIT/ML suspension USE AS DIRECTED 5 MLS (500,000 UNITS TOTAL) IN THE MOUTH OR THROAT 4 (FOUR) TIMES DAILY. 120 mL 0   pantoprazole (PROTONIX) 40 MG tablet Take 1 tablet (40 mg total) by mouth daily. For GERD/acid reflux 90 tablet 1   REFRESH CELLUVISC 1 % GEL      RESTASIS 0.05 % ophthalmic emulsion Instill 1 drop into both eyes twice a day     TURMERIC PO Take by mouth.     ipratropium (ATROVENT) 0.06 % nasal spray Place 2 sprays into both nostrils 4 (four) times daily. (Patient not taking: Reported on 12/14/2020) 15 mL 12   rosuvastatin (CRESTOR) 10 MG tablet Take 10 mg by mouth at bedtime. (Patient not taking: Reported on 12/14/2020)     No current facility-administered medications for this visit.    OBJECTIVE: Vitals:   12/14/20 1114  BP: 135/80  Pulse: 69  Resp: 16  Temp: (!) 96.9 F (36.1 C)     Body mass index is 20.4 kg/m.    ECOG FS:0 - Asymptomatic  General: Well-developed, well-nourished, no acute distress. Eyes: Pink conjunctiva, anicteric sclera. HEENT: Normocephalic, moist mucous membranes. Lungs: No audible wheezing or coughing. Heart: Regular rate and  rhythm. Abdomen: Soft, nontender, no obvious distention. Musculoskeletal: No edema, cyanosis, or clubbing. Neuro: Alert, answering all questions appropriately. Cranial nerves grossly intact. Skin: No rashes or petechiae noted. Psych: Normal affect.   LAB RESULTS:  Lab Results  Component Value Date   NA 141 05/02/2020   K 3.9 05/02/2020   CL 106 05/02/2020   CO2 29 05/02/2020   GLUCOSE 112 (H) 05/02/2020   BUN 13 05/02/2020   CREATININE 0.55 (L) 05/02/2020   CALCIUM 9.0 05/02/2020   PROT 6.2 05/02/2020   ALBUMIN 3.9 12/17/2016   AST 18 05/02/2020   ALT 14 05/02/2020   ALKPHOS 79 12/17/2016   BILITOT 0.4 05/02/2020   GFRNONAA 95 05/02/2020   GFRAA 110 05/02/2020    Lab Results  Component Value Date   WBC 3.2 (L) 12/14/2020   NEUTROABS 1.5 (L) 12/14/2020  HGB 12.6 12/14/2020   HCT 39.5 12/14/2020   MCV 90.2 12/14/2020   PLT 185 12/14/2020     STUDIES: No results found.  ASSESSMENT: Leukopenia.  PLAN:   1.  Leukopenia: Chronic and unchanged.  Patient's white blood cell count remains decreased in approximately her baseline at 3.2.  She was previously noted to have positive neutrophil antibodies, but the clinical significance of this is unclear.  The remainder of her blood work including peripheral blood flow cytometry was either negative or within normal limits.  No intervention is needed.  Patient does not require bone marrow biopsy.  After lengthy discussion with the patient, is agreed upon that no further follow-up is necessary.  Please continue to monitor patient CBC once per year and refer back if there are any questions or concerns.  I spent a total of 20 minutes reviewing chart data, face-to-face evaluation with the patient, counseling and coordination of care as detailed above.   Patient expressed understanding and was in agreement with this plan. She also understands that She can call clinic at any time with any questions, concerns, or complaints.     Lloyd Huger, MD   12/14/2020 11:50 AM

## 2020-12-14 ENCOUNTER — Encounter: Payer: Self-pay | Admitting: Oncology

## 2020-12-14 ENCOUNTER — Inpatient Hospital Stay: Payer: Medicare HMO | Admitting: Oncology

## 2020-12-14 ENCOUNTER — Inpatient Hospital Stay: Payer: Medicare HMO | Attending: Oncology

## 2020-12-14 VITALS — BP 135/80 | HR 69 | Temp 96.9°F | Resp 16 | Wt 132.2 lb

## 2020-12-14 DIAGNOSIS — K219 Gastro-esophageal reflux disease without esophagitis: Secondary | ICD-10-CM | POA: Diagnosis not present

## 2020-12-14 DIAGNOSIS — D72819 Decreased white blood cell count, unspecified: Secondary | ICD-10-CM | POA: Insufficient documentation

## 2020-12-14 DIAGNOSIS — M5416 Radiculopathy, lumbar region: Secondary | ICD-10-CM | POA: Diagnosis not present

## 2020-12-14 DIAGNOSIS — E7800 Pure hypercholesterolemia, unspecified: Secondary | ICD-10-CM | POA: Insufficient documentation

## 2020-12-14 DIAGNOSIS — Z79899 Other long term (current) drug therapy: Secondary | ICD-10-CM | POA: Insufficient documentation

## 2020-12-14 DIAGNOSIS — M47896 Other spondylosis, lumbar region: Secondary | ICD-10-CM | POA: Diagnosis not present

## 2020-12-14 DIAGNOSIS — M1712 Unilateral primary osteoarthritis, left knee: Secondary | ICD-10-CM | POA: Insufficient documentation

## 2020-12-14 DIAGNOSIS — M47816 Spondylosis without myelopathy or radiculopathy, lumbar region: Secondary | ICD-10-CM | POA: Insufficient documentation

## 2020-12-14 DIAGNOSIS — M25562 Pain in left knee: Secondary | ICD-10-CM

## 2020-12-14 DIAGNOSIS — E119 Type 2 diabetes mellitus without complications: Secondary | ICD-10-CM | POA: Insufficient documentation

## 2020-12-14 DIAGNOSIS — M13862 Other specified arthritis, left knee: Secondary | ICD-10-CM | POA: Diagnosis not present

## 2020-12-14 DIAGNOSIS — D709 Neutropenia, unspecified: Secondary | ICD-10-CM

## 2020-12-14 HISTORY — DX: Pain in left knee: M25.562

## 2020-12-14 LAB — CBC WITH DIFFERENTIAL/PLATELET
Abs Immature Granulocytes: 0.01 10*3/uL (ref 0.00–0.07)
Basophils Absolute: 0.1 10*3/uL (ref 0.0–0.1)
Basophils Relative: 2 %
Eosinophils Absolute: 0 10*3/uL (ref 0.0–0.5)
Eosinophils Relative: 0 %
HCT: 39.5 % (ref 36.0–46.0)
Hemoglobin: 12.6 g/dL (ref 12.0–15.0)
Immature Granulocytes: 0 %
Lymphocytes Relative: 38 %
Lymphs Abs: 1.2 10*3/uL (ref 0.7–4.0)
MCH: 28.8 pg (ref 26.0–34.0)
MCHC: 31.9 g/dL (ref 30.0–36.0)
MCV: 90.2 fL (ref 80.0–100.0)
Monocytes Absolute: 0.5 10*3/uL (ref 0.1–1.0)
Monocytes Relative: 15 %
Neutro Abs: 1.5 10*3/uL — ABNORMAL LOW (ref 1.7–7.7)
Neutrophils Relative %: 45 %
Platelets: 185 10*3/uL (ref 150–400)
RBC: 4.38 MIL/uL (ref 3.87–5.11)
RDW: 13.6 % (ref 11.5–15.5)
WBC: 3.2 10*3/uL — ABNORMAL LOW (ref 4.0–10.5)
nRBC: 0 % (ref 0.0–0.2)

## 2020-12-14 NOTE — Progress Notes (Signed)
Patient denies new problems/concerns today.   °

## 2021-01-31 ENCOUNTER — Other Ambulatory Visit: Payer: Self-pay | Admitting: Family Medicine

## 2021-01-31 DIAGNOSIS — J324 Chronic pansinusitis: Secondary | ICD-10-CM

## 2021-01-31 DIAGNOSIS — H6983 Other specified disorders of Eustachian tube, bilateral: Secondary | ICD-10-CM

## 2021-05-23 DIAGNOSIS — H524 Presbyopia: Secondary | ICD-10-CM | POA: Diagnosis not present

## 2021-05-23 DIAGNOSIS — H16223 Keratoconjunctivitis sicca, not specified as Sjogren's, bilateral: Secondary | ICD-10-CM | POA: Diagnosis not present

## 2021-06-06 DIAGNOSIS — H16223 Keratoconjunctivitis sicca, not specified as Sjogren's, bilateral: Secondary | ICD-10-CM | POA: Diagnosis not present

## 2021-06-15 DIAGNOSIS — M170 Bilateral primary osteoarthritis of knee: Secondary | ICD-10-CM | POA: Diagnosis not present

## 2021-06-27 DIAGNOSIS — H16223 Keratoconjunctivitis sicca, not specified as Sjogren's, bilateral: Secondary | ICD-10-CM | POA: Diagnosis not present

## 2021-06-28 ENCOUNTER — Ambulatory Visit: Payer: Medicare HMO

## 2021-08-21 DIAGNOSIS — M170 Bilateral primary osteoarthritis of knee: Secondary | ICD-10-CM | POA: Diagnosis not present

## 2021-08-21 DIAGNOSIS — M13862 Other specified arthritis, left knee: Secondary | ICD-10-CM | POA: Diagnosis not present

## 2021-08-21 DIAGNOSIS — M25562 Pain in left knee: Secondary | ICD-10-CM | POA: Diagnosis not present

## 2021-09-05 DIAGNOSIS — H16223 Keratoconjunctivitis sicca, not specified as Sjogren's, bilateral: Secondary | ICD-10-CM | POA: Diagnosis not present

## 2021-09-08 ENCOUNTER — Other Ambulatory Visit: Payer: Self-pay

## 2021-09-08 ENCOUNTER — Encounter: Payer: Self-pay | Admitting: Emergency Medicine

## 2021-09-08 ENCOUNTER — Emergency Department
Admission: EM | Admit: 2021-09-08 | Discharge: 2021-09-08 | Disposition: A | Discharge status: Home / Self Care | Payer: Medicare HMO | Attending: Emergency Medicine | Admitting: Emergency Medicine

## 2021-09-08 DIAGNOSIS — Y92007 Garden or yard of unspecified non-institutional (private) residence as the place of occurrence of the external cause: Secondary | ICD-10-CM | POA: Insufficient documentation

## 2021-09-08 DIAGNOSIS — S39012A Strain of muscle, fascia and tendon of lower back, initial encounter: Secondary | ICD-10-CM | POA: Insufficient documentation

## 2021-09-08 DIAGNOSIS — S3992XA Unspecified injury of lower back, initial encounter: Secondary | ICD-10-CM | POA: Diagnosis present

## 2021-09-08 DIAGNOSIS — X501XXA Overexertion from prolonged static or awkward postures, initial encounter: Secondary | ICD-10-CM | POA: Insufficient documentation

## 2021-09-08 MED ORDER — HYDROCODONE-ACETAMINOPHEN 5-325 MG PO TABS: 1.0000 | ORAL_TABLET | Freq: Four times a day (QID) | ORAL | 0 refills | Status: DC | PRN

## 2021-09-08 MED ORDER — HYDROCODONE-ACETAMINOPHEN 5-325 MG PO TABS
1.0000 | ORAL_TABLET | Freq: Once | ORAL | Status: AC
  Administered 2021-09-08: 1 via ORAL
  Filled 2021-09-08: qty 1

## 2021-09-08 MED ORDER — LIDOCAINE 5 % EX PTCH
1.0000 | MEDICATED_PATCH | CUTANEOUS | Status: DC
  Administered 2021-09-08: 1 via TRANSDERMAL
  Filled 2021-09-08: qty 1

## 2021-09-08 MED ORDER — LIDOCAINE 5 % EX PTCH: 1.0000 | MEDICATED_PATCH | Freq: Two times a day (BID) | CUTANEOUS | 0 refills | Status: DC

## 2021-09-08 NOTE — ED Provider Notes (Signed)
? ?[facility] ?Provider Note ? ? ? Event Date/Time  ? First MD Initiated Contact with Patient 09/08/21 [phone]   ?  (approximate) ? ? ?History  ? ?Back Pain ? ? ?HPI ? ?Ebony [name] is a 71 y.o. female presents to the ED with complaint of low back pain.  Patient states that she was out in the yard "doing things that she should not have".  She states that she was trying to push up some things was down after it had  rained causing her back to her when she got in the house.  No direct trauma to her back.  Denies fall.  This happened 2 days ago and she still continues to have some pain.  She denies any radiation into her lower extremities and has continued to ambulate since that time.  She denies any paresthesias into her lower extremities, incontinence of bowel or bladder.  She also denies any urinary symptoms or history of kidney stones.  Patient has a history of prehypertension, borderline diabetes, GERD, hypercholesterolemia and anemia.  She rates her pain as a 10/10. ?  ? ? ?Physical Exam  ? ?Triage Vital Signs: ?ED Triage Vitals  ?Enc Vitals Group  ?   BP 09/08/21 0719 100/74  ?   Pulse Rate 09/08/21 0719 90  ?   Resp 09/08/21 0719 18  ?   Temp 09/08/21 0719 97.7 ?F (36.5 ?C)  ?   Temp Source 09/08/21 0719 Oral  ?   SpO2 09/08/21 0719 97 %  ?   Weight 09/08/21 0723 132 lb (59.9 kg)  ?   Height 09/08/21 0723 '5\' 6"'$  (1.676 m)  ?   Head Circumference --   ?   Peak Flow --   ?   Pain Score 09/08/21 0722 10  ?   Pain Loc --   ?   Pain Edu? --   ?   Excl. in [place]? --   ? ? ?Most recent vital signs: ?Vitals:  ? 09/08/21 0719  ?BP: 100/74  ?Pulse: 90  ?Resp: 18  ?Temp: 97.7 ?F (36.5 ?C)  ?SpO2: 97%  ? ? ? ?General: Awake, no distress.  Very pleasant, lying supine. ?CV:  Good peripheral perfusion.  Heart regular rate and rhythm. ?Resp:  Normal effort.  Lungs are clear bilaterally. ?Abd:  No distention.  Soft, flat, nontender. ?Other:  No point tenderness on palpation of the thoracic or lumbar spine.   There is moderate tenderness over the right SI joint area.  No evidence of injury.  Patient is able to raise lower extremities independently without assistance.  Reflexes are equal at 2+ bilaterally.  Good muscle strength at 5/5.  Straight leg raises are approximately 40 degrees to the left leg with pain to the right SI joint area and 45 degrees to the right leg with less pain than the left.  Patient is able to stand and ambulate without any assistance. ? ? ?ED Results / Procedures / Treatments  ? ?Labs ?(all labs ordered are listed, but only abnormal results are displayed) ?Labs Reviewed - No data to display ? ? ? ?PROCEDURES: ? ?Critical Care performed:  ? ?Procedures ? ? ?MEDICATIONS ORDERED IN ED: ?Medications  ?lidocaine (LIDODERM) 5 % 1 patch (1 patch Transdermal Patch Applied 09/08/21 0839)  ?HYDROcodone-acetaminophen (NORCO/VICODIN) 5-325 MG per tablet 1 tablet (1 tablet Oral Given 09/08/21 0838)  ? ? ? ?IMPRESSION / MDM / ASSESSMENT AND PLAN / ED COURSE  ?I reviewed the triage vital  signs and the nursing notes. ? ? ?Differential diagnosis includes, but is not limited to, muscle strain, low back pain, compression fracture vertebral bodies. ? ?71 year old female presents to the ED with low back pain after doing some yard work 2 days ago.  Patient states that she did not fall and there was no direct trauma.  Patient reports that she was pushing some things in the yard up after it rained.  Patient was given Norco while in the ED and a Lidoderm patch.  There was no point tenderness on palpation of the thoracic or lumbar spine and no step-offs were appreciated.  No evidence to support cauda equina and there was moderate tenderness on the right SI joint area and surrounding muscle tissue.  Patient is aware that the [place] that was sent to the pharmacy is for moderate to severe pain and the Lidoderm patch will not cause drowsiness but should help with her pain while she is active.  She is to follow-up with her PCP if  any continued problems. ? ? ? ?  ? ? ?FINAL CLINICAL IMPRESSION(S) / ED DIAGNOSES  ? ?Final diagnoses:  ?Strain of lumbar region, initial encounter  ? ? ? ?Rx / DC Orders  ? ?ED Discharge Orders   ? ?      Ordered  ?  HYDROcodone-acetaminophen (NORCO/VICODIN) 5-325 MG tablet  Every 6 hours PRN       ? 09/08/21 0835  ?  lidocaine (LIDODERM) 5 %  Every 12 hours       ? 09/08/21 0835  ? ?  ?  ? ?  ? ? ? ?Note:  This document was prepared using Dragon voice recognition software and may include unintentional dictation errors. ?  ?Johnn Hai, PA-C ?09/08/21 1251 ? ?  ?Lavonia Drafts, MD ?09/08/21 1300 ? ?

## 2021-09-08 NOTE — Discharge Instructions (Signed)
Follow-up with your primary care provider if any continued problems or concerns.  The pain medication was sent to the pharmacy to take every 6 hours as needed for moderate to severe pain.  This medication could cause drowsiness and increase your risk for falling.  Be very careful when you are taking it.  No driving or operating machinery while taking this medication.  The Lidoderm patch that was applied to your back does not cause drowsiness but should help with your pain.  Do not take additional Tylenol with your pain medication as it already contains Tylenol in it. ?

## 2021-09-08 NOTE — ED Triage Notes (Signed)
Pt via POV from home. Pt c/o lower back pain since Thursday. Pt states she was doing yard work and that is when it started. Denies urinary symptoms. Denies NVD. Pt is A&Ox4 and NAD ?

## 2021-09-08 NOTE — ED Notes (Signed)
Pt c/o pain that started on her R side, Thursday. Radiating to middle of her back. Pt used tylenol, pain patches, and others no relief. Pt has full ROM and PMS functions intact.  ?

## 2021-09-13 ENCOUNTER — Encounter: Payer: Self-pay | Admitting: Nurse Practitioner

## 2021-09-13 ENCOUNTER — Other Ambulatory Visit: Payer: Self-pay

## 2021-09-13 ENCOUNTER — Ambulatory Visit (INDEPENDENT_AMBULATORY_CARE_PROVIDER_SITE_OTHER): Payer: Medicare HMO | Admitting: Nurse Practitioner

## 2021-09-13 ENCOUNTER — Ambulatory Visit: Payer: Self-pay | Admitting: *Deleted

## 2021-09-13 VITALS — BP 128/84 | HR 85 | Temp 98.1°F | Resp 16 | Ht 68.0 in | Wt 133.0 lb

## 2021-09-13 DIAGNOSIS — M545 Low back pain, unspecified: Secondary | ICD-10-CM | POA: Diagnosis not present

## 2021-09-13 MED ORDER — TIZANIDINE HCL 4 MG PO TABS: 4.0000 mg | ORAL_TABLET | Freq: Three times a day (TID) | ORAL | 0 refills | Status: AC | PRN

## 2021-09-13 MED ORDER — PREDNISONE 10 MG (21) PO TBPK: ORAL_TABLET | ORAL | 0 refills | Status: DC

## 2021-09-13 NOTE — Telephone Encounter (Signed)
Reason for Disposition ? [1] MODERATE back pain (e.g., interferes with normal activities) AND [2] present > 3 days ? ?Answer Assessment - Initial Assessment Questions ?1. ONSET: "When did the pain begin?"  ?    Went to ED on Sat. For lower back pain.  Started on side and radiated into lower back.   Now it's going up my back.   It just hits me suddenly the pain. ?I was working in yard and I think it happened during that time.   ?2. LOCATION: "Where does it hurt?" (upper, mid or lower back) ?    Lower back and going up back. ?3. SEVERITY: "How bad is the pain?"  (e.g., Scale 1-10; mild, moderate, or severe) ?  - MILD (1-3): doesn't interfere with normal activities  ?  - MODERATE (4-7): interferes with normal activities or awakens from sleep  ?  - SEVERE (8-10): excruciating pain, unable to do any normal activities  ?    *No Answer* ?4. PATTERN: "Is the pain constant?" (e.g., yes, no; constant, intermittent)  ?    Intermittent hard pain.    ?5. RADIATION: "Does the pain shoot into your legs or elsewhere?" ?    No pain in legs. ?6. CAUSE:  "What do you think is causing the back pain?"  ?    ED said I had sore muscles.   No x rays in ED.   No urine specimen done.    ?7. BACK OVERUSE:  "Any recent lifting of heavy objects, strenuous work or exercise?" ?    Working in yard. ?8. MEDICATIONS: "What have you taken so far for the pain?" (e.g., nothing, acetaminophen, NSAIDS) ?    ED gave me hydrocodone/Tylenol.   It made me nauseas and confused.   I'm taking Tylenol now.     Patch given to me too.   It's Lidocaine patch.   It didn't help.   ?9. NEUROLOGIC SYMPTOMS: "Do you have any weakness, numbness, or problems with bowel/bladder control?" ?    No  ?10. OTHER SYMPTOMS: "Do you have any other symptoms?" (e.g., fever, abdominal pain, burning with urination, blood in urine) ?      No urinary problems.   ?11. PREGNANCY: "Is there any chance you are pregnant?" (e.g., yes, no; LMP) ?      *No Answer* ? ?Protocols used: Back  Pain-A-AH ? ?

## 2021-09-13 NOTE — Progress Notes (Signed)
? ?BP 128/84   Pulse 85   Temp 98.1 ?F (36.7 ?C) (Oral)   Resp 16   Ht '5\' 8"'$  (1.727 m)   Wt 133 lb (60.3 kg)   SpO2 98%   BMI 20.22 kg/m?   ? ?Subjective:  ? ? Patient ID: Ebony [name], female    DOB: November 28, 1950, 71 y.o.   MRN: [id number] ? ?HPI: ?Ebony [name] is a 71 y.o. female ? ?Chief Complaint  ?Patient presents with  ? Follow-up  ?  ER for back pain  ? ?Low back pain: Patient was seen in the emergency department at [facility] on 09/08/2021.  She was diagnosed with a muscle strain in her lumbar region.  She was treated with lidocaine patch and hydrocodone.  Patient reports that over the weekend she was doing a lot of yard work.  She did not have any falls no trauma.  She was pushing and pulling on a tarp and then that night she started having some back pain.  The pain does not radiate down her legs.  Patient reports that she still having pain.  Patient is having trouble walking around.  She denies any incontinence of bowel or urine.  She denies any numbness or tingling down her legs.  She denies any fever, urinary frequency, urinary urgency or dysuria.  Discussed stopping the hydrocodone.  Continue using lidocaine patch.  Take Tylenol for pain.  We will prescribe steroid pack and muscle relaxer.  Discussed with patient about being careful with muscle relaxer that it could make her sleepy.  Discussed using heat therapy and doing stretches.  Patient is in agreement with plan. ? ?Relevant past medical, surgical, family and social history reviewed and updated as indicated. Interim medical history since our last visit reviewed. ?Allergies and medications reviewed and updated. ? ?Review of Systems ? ?Constitutional: Negative for fever or weight change.  ?Respiratory: Negative for cough and shortness of breath.   ?Cardiovascular: Negative for chest pain or palpitations.  ?Gastrointestinal: Negative for abdominal pain, no bowel changes.  ?Musculoskeletal: positive for gait problem, negative for joint swelling.   Positive for low back pain ?Skin: Negative for rash.  ?Neurological: Negative for dizziness or headache.  ?No other specific complaints in a complete review of systems (except as listed in HPI above).  ? ?   ?Objective:  ?  ?BP 128/84   Pulse 85   Temp 98.1 ?F (36.7 ?C) (Oral)   Resp 16   Ht '5\' 8"'$  (1.727 m)   Wt 133 lb (60.3 kg)   SpO2 98%   BMI 20.22 kg/m?   ?Wt Readings from Last 3 Encounters:  ?09/13/21 133 lb (60.3 kg)  ?09/08/21 132 lb (59.9 kg)  ?12/14/20 132 lb 3.2 oz (60 kg)  ?  ?Physical Exam ? ?Constitutional: Patient appears well-developed and well-nourished.  No distress.  ?HEENT: head atraumatic, normocephalic, pupils equal and reactive to light, neck supple ?Cardiovascular: Normal rate, regular rhythm and normal heart sounds.  No murmur heard. No BLE edema. ?Pulmonary/Chest: Effort normal and breath sounds normal. No respiratory distress. ?Abdominal: Soft.  There is no tenderness. ?MSK: Tenderness noted in the lower back ?Psychiatric: Patient has a normal mood and affect. behavior is normal. Judgment and thought content normal.  ?Results for orders placed or performed in visit on 12/14/20  ?CBC with Differential/Platelet  ?Result Value Ref Range  ? WBC 3.2 (L) 4.0 - 10.5 K/uL  ? RBC 4.38 3.87 - 5.11 MIL/uL  ? Hemoglobin 12.6 12.0 - 15.0  g/dL  ? HCT 39.5 36.0 - 46.0 %  ? MCV 90.2 80.0 - 100.0 fL  ? MCH 28.8 26.0 - 34.0 pg  ? MCHC 31.9 30.0 - 36.0 g/dL  ? RDW 13.6 11.5 - 15.5 %  ? Platelets 185 150 - 400 K/uL  ? nRBC 0.0 0.0 - 0.2 %  ? Neutrophils Relative % 45 %  ? Neutro Abs 1.5 (L) 1.7 - 7.7 K/uL  ? Lymphocytes Relative 38 %  ? Lymphs Abs 1.2 0.7 - 4.0 K/uL  ? Monocytes Relative 15 %  ? Monocytes Absolute 0.5 0.1 - 1.0 K/uL  ? Eosinophils Relative 0 %  ? Eosinophils Absolute 0.0 0.0 - 0.5 K/uL  ? Basophils Relative 2 %  ? Basophils Absolute 0.1 0.0 - 0.1 K/uL  ? Immature Granulocytes 0 %  ? Abs Immature Granulocytes 0.01 0.00 - 0.07 K/uL  ? ?   ?Assessment & Plan:  ? ?Problem List Items  Addressed This Visit   ?None ?Visit Diagnoses   ? ? Acute bilateral low back pain without sciatica    -  Primary  ? Continue using lidocaine patch.  Take over-the-counter Tylenol for pain.  Sending in prescription for muscle relaxer and steroid pack.  Heat therapy and stretch  ? Relevant Medications  ? tiZANidine (ZANAFLEX) 4 MG tablet  ? predniSONE (STERAPRED UNI-PAK 21 TAB) 10 MG (21) TBPK tablet  ? ?  ?  ? ?Follow up plan: ?Return if symptoms worsen or fail to improve, for Patient needs to schedule a follow-up for chronic conditions.. ? ? ? ? ? ?

## 2021-09-13 NOTE — Telephone Encounter (Signed)
?  Chief Complaint: Sudden painful spasms in lower back.   Seen in ED Sat. For strained lower back muscles. ?Symptoms: sudden pains that are coming on suddenly then resolve, then hit again.   ?Frequency: now ?Pertinent Negatives: Patient denies urinary symptoms.  Denies pain going down either leg.   Was working in yard Sat. When this started. ?Disposition: '[]'$ ED /'[]'$ Urgent Care (no appt availability in office) / '[x]'$ Appointment(In office/virtual)/ '[]'$  [facility]/ '[]'$ Home Care/ '[]'$ Refused Recommended Disposition /'[]'$ [facility]/ '[]'$  Follow-up with PCP ?Additional Notes: Appt made for today with Serafina Royals, PA for 1:20.    ?

## 2021-09-24 ENCOUNTER — Telehealth: Payer: Self-pay | Admitting: Family Medicine

## 2021-09-24 ENCOUNTER — Ambulatory Visit: Payer: Self-pay

## 2021-09-24 DIAGNOSIS — H6983 Other specified disorders of Eustachian tube, bilateral: Secondary | ICD-10-CM

## 2021-09-24 DIAGNOSIS — J324 Chronic pansinusitis: Secondary | ICD-10-CM

## 2021-09-24 NOTE — Telephone Encounter (Signed)
Chief Complaint: Back spasms Symptoms: Pain 8-9 to mid back Frequency: Onset Friday morning Pertinent Negatives: Patient denies other symptoms Disposition: '[]'$ ED /'[]'$ Urgent Care (no appt availability in office) / '[x]'$ Appointment(In office/virtual)/ '[]'$  [facility]/ '[]'$ Home Care/ '[]'$ Refused Recommended Disposition /'[]'$ [place]/ '[]'$  Follow-up with PCP Additional Notes: Had OV on 09/13/21 and prescribed Tizanidine and Prednisone, reports Tizanidine not really helping, uses a heating pad that helps, Tylenol didn't help.     Summary: muscle spams   Patient calling.  States that she is having muscle spasms again.  That they are running up her back and they hit sporadically.  Patient states she saw Serafina Royals and was given steroids that helped, but pain is back now and she would like to know what to take and what to do.      Reason for Disposition  [1] MODERATE back pain (e.g., interferes with normal activities) AND [2] present > 3 days  Answer Assessment - Initial Assessment Questions 1. ONSET: "When did the pain begin?"      Friday morning 2. LOCATION: "Where does it hurt?" (upper, mid or lower back)     Mid back 3. SEVERITY: "How bad is the pain?"  (e.g., Scale 1-10; mild, moderate, or severe)   - MILD (1-3): doesn't interfere with normal activities    - MODERATE (4-7): interferes with normal activities or awakens from sleep    - SEVERE (8-10): excruciating pain, unable to do any normal activities      8-9 4. PATTERN: "Is the pain constant?" (e.g., yes, no; constant, intermittent)      Constant 5. RADIATION: "Does the pain shoot into your legs or elsewhere?"     No 6. CAUSE:  "What do you think is causing the back pain?"      Muscle spasms 7. BACK OVERUSE:  "Any recent lifting of heavy objects, strenuous work or exercise?"     Yes 8. MEDICATIONS: "What have you taken so far for the pain?" (e.g., nothing, acetaminophen, NSAIDS)     Tylenol, tizanidine 9. NEUROLOGIC  SYMPTOMS: "Do you have any weakness, numbness, or problems with bowel/bladder control?"     No 10. OTHER SYMPTOMS: "Do you have any other symptoms?" (e.g., fever, abdominal pain, burning with urination, blood in urine)       No 11. PREGNANCY: "Is there any chance you are pregnant?" (e.g., yes, no; LMP)       N/A  Protocols used: Back Pain-A-AH

## 2021-09-25 ENCOUNTER — Encounter: Payer: Self-pay | Admitting: Family Medicine

## 2021-09-25 ENCOUNTER — Ambulatory Visit: Payer: Medicare HMO | Admitting: Family Medicine

## 2021-09-25 ENCOUNTER — Ambulatory Visit
Admission: RE | Admit: 2021-09-25 | Discharge: 2021-09-25 | Disposition: A | Discharge status: Home / Self Care | Payer: Medicare HMO | Source: Ambulatory Visit | Attending: Family Medicine | Admitting: Family Medicine

## 2021-09-25 ENCOUNTER — Ambulatory Visit
Admission: RE | Admit: 2021-09-25 | Discharge: 2021-09-25 | Disposition: A | Discharge status: Home / Self Care | Payer: Medicare HMO | Attending: Family Medicine | Admitting: Family Medicine

## 2021-09-25 VITALS — BP 114/74 | HR 84 | Temp 98.0°F | Resp 14 | Ht 67.0 in | Wt 135.6 lb

## 2021-09-25 DIAGNOSIS — M810 Age-related osteoporosis without current pathological fracture: Secondary | ICD-10-CM | POA: Diagnosis not present

## 2021-09-25 DIAGNOSIS — M5136 Other intervertebral disc degeneration, lumbar region: Secondary | ICD-10-CM | POA: Diagnosis not present

## 2021-09-25 DIAGNOSIS — M546 Pain in thoracic spine: Secondary | ICD-10-CM

## 2021-09-25 DIAGNOSIS — M47814 Spondylosis without myelopathy or radiculopathy, thoracic region: Secondary | ICD-10-CM | POA: Diagnosis not present

## 2021-09-25 DIAGNOSIS — M47816 Spondylosis without myelopathy or radiculopathy, lumbar region: Secondary | ICD-10-CM | POA: Diagnosis not present

## 2021-09-25 DIAGNOSIS — M4184 Other forms of scoliosis, thoracic region: Secondary | ICD-10-CM | POA: Diagnosis not present

## 2021-09-25 DIAGNOSIS — Z1231 Encounter for screening mammogram for malignant neoplasm of breast: Secondary | ICD-10-CM

## 2021-09-25 DIAGNOSIS — M545 Low back pain, unspecified: Secondary | ICD-10-CM | POA: Insufficient documentation

## 2021-09-25 MED ORDER — LIDOCAINE 5 % EX PTCH: 1.0000 | MEDICATED_PATCH | Freq: Two times a day (BID) | CUTANEOUS | 0 refills | Status: DC

## 2021-09-25 NOTE — Patient Instructions (Signed)
Lumbosacral Strain Lumbosacral strain is an injury that causes pain in the lower back (lumbosacral spine). This injury usually happens from overstretching the muscles or ligaments along your spine. Ligaments are cord-like tissues that connect bones to other bones. A strain can affect one or more muscles or ligaments. What are the causes? This condition may be caused by: A hard, direct hit to the back. Overstretching the lower back muscles. This may result from: A fall. Lifting something heavy. Repetitive movements such as bending or crouching. What increases the risk? The following factors may make you more likely to develop this condition: Participating in sports or activities that involve: A sudden twist of the back. Pushing or pulling motions. Being overweight or obese. Having poor strength and flexibility, especially tight hamstrings or weak muscles in the back or abdomen. Having too much of a curve in the lower back. Having a pelvis that is tilted forward. What are the signs or symptoms? The main symptom of this condition is pain in the lower back, at the site of the strain. Pain may also be felt down one or both legs. How is this diagnosed? This condition is diagnosed based on your symptoms, your medical history, and a physical exam. During the physical exam, your health care provider may push on certain areas of your back to find the source of your pain. You may be asked to bend forward, backward, and side to side to check your pain and range of motion. You may also have imaging tests, such as X-rays and an MRI. How is this treated? This condition may be treated by: Applying heat and cold on the affected area. Taking medicines to help relieve pain and relax your muscles. Taking NSAIDs, such as ibuprofen, to help reduce swelling and discomfort. Doing stretching and strengthening exercises for your lower back. Symptoms usually improve within several weeks of treatment. However,  recovery time varies. When your symptoms improve, gradually return to your normal routine as soon as possible to reduce pain, avoid stiffness, and keep muscle strength. Follow these instructions at home: Medicines Take over-the-counter and prescription medicines only as told by your health care provider. Ask your health care provider if the medicine prescribed to you: Requires you to avoid driving or using heavy machinery. Can cause constipation. You may need to take these actions to prevent or treat constipation: Drink enough fluid to keep your urine pale yellow. Take over-the-counter or prescription medicines. Eat foods that are high in fiber, such as beans, whole grains, and fresh fruits and vegetables. Limit foods that are high in fat and processed sugars, such as fried or sweet foods. Managing pain, stiffness, and swelling     If directed, put ice on the injured area. To do this: Put ice in a plastic bag. Place a towel between your skin and the bag. Leave the ice on for 20 minutes, 2-3 times a day. If directed, apply heat on the affected area as often as told by your health care provider. Use the heat source that your health care provider recommends, such as a moist heat pack or a heating pad. Place a towel between your skin and the heat source. Leave the heat on for 20-30 minutes. Remove the heat if your skin turns bright red. This is especially important if you are unable to feel pain, heat, or cold. You may have a greater risk of getting burned. Activity Rest as told by your health care provider. Do not stay in bed. Staying in bed for  more than 1-2 days can delay your recovery. Return to your normal activities as told by your health care provider. Ask your health care provider what activities are safe for you. Avoid activities that take a lot of energy for as long as told by your health care provider. Do exercises as told by your health care provider. This includes stretching and  strengthening exercises. General instructions Sit up and stand up straight. Avoid leaning forward when you sit, or hunching over when you stand. Do not use any products that contain nicotine or tobacco, such as cigarettes, e-cigarettes, and chewing tobacco. If you need help quitting, ask your health care provider. Keep all follow-up visits as told by your health care provider. This is important. How is this prevented?  Use correct form when playing sports and lifting heavy objects. Use good posture when sitting and standing. Maintain a healthy weight. Sleep on a mattress with medium firmness to support your back. Do at least 150 minutes of moderate-intensity exercise each week, such as brisk walking or water aerobics. Try a form of exercise that takes stress off your back, such as swimming or stationary cycling. Maintain physical fitness, including: Strength. Flexibility. Contact a health care provider if: Your back pain does not improve after several weeks of treatment. Your symptoms get worse. Get help right away if: Your back pain is severe. You cannot stand or walk. You have difficulty controlling when you urinate or when you have a bowel movement. You feel nauseous or you vomit. Your feet or legs get very cold, turn pale, or look blue. You have numbness, tingling, weakness, or problems using your arms or legs. You develop any of the following: Shortness of breath. Dizziness. Pain in your legs. Weakness in your buttocks or legs. Summary Lumbosacral strain is an injury that causes pain in the lower back (lumbosacral spine). This injury usually happens from overstretching the muscles or ligaments along your spine. This condition may be caused by a direct hit to the lower back or by overstretching the lower back muscles. Symptoms usually improve within several weeks of treatment. This information is not intended to replace advice given to you by your health care provider. Make  sure you discuss any questions you have with your health care provider. Document Revised: 09/15/2018 Document Reviewed: 09/15/2018 Elsevier Patient Education  [place].

## 2021-09-25 NOTE — Progress Notes (Unsigned)
Patient ID: Ebony [name], female    DOB: 26-Nov-1950, 72 y.o.   MRN: [id number]  PCP: Delsa Grana, PA-C  Chief Complaint  Patient presents with   Spasms    Back    Subjective:   Ebony [name] is a 71 y.o. female, presents to clinic with CC of the following:  HPI  Low to mid back pain for nearly a month, she did ER visit and she is here to f/u She strained back when reaching and pushing something heavy after working in her yard. She has continued low back pain, it has stopped radiating down legs but it is still difficult to move - position changes and getting out of bed int he morning are the worse.  She his also doing heat tx She denies abd pain, leg weakness, incontinence of stool/urine, saddle anesthesia She has hx of osteoporosis.    Patient Active Problem List   Diagnosis Date Noted   Pain in joint of left knee 12/14/2020   Arthritis of left knee 12/14/2020   Lumbar radiculopathy 12/14/2020   Lumbar spondylosis 12/14/2020   Dysphagia 06/12/2020   Gastroesophageal reflux disease 05/02/2020   Palpitation 09/29/2019   Anemia, unspecified 09/25/2019   Chest pain 02/25/2019   B12 deficiency 12/30/2018   Hyperlipidemia 07/01/2018   Prediabetes 07/01/2018   Neutropenia, unspecified type ([place]) 12/18/2016   Loss of weight 12/17/2016   Adjustment disorder with mixed anxiety and depressed mood 10/11/2015   Heartburn 10/11/2015   Arthritis of knee, degenerative 07/18/2015      Current Outpatient Medications:    acetaminophen (TYLENOL) 500 MG tablet, Take 1-2 tablets (500-1,000 mg total) by mouth every 8 (eight) hours as needed for moderate pain. OTC, Disp: 30 tablet, Rfl: 0   Calcium Carbonate-Vit D-Min (CALCIUM 1200 PO), Take 600 mg by mouth daily. , Disp: , Rfl:    Cholecalciferol (VITAMIN D) 125 MCG (5000 UT) CAPS, Take 1 capsule by mouth daily., Disp: , Rfl:    CINNAMON PO, Take by mouth., Disp: , Rfl:    Cyanocobalamin (VITAMIN B-12) 1000 MCG SUBL, Place 1,000  mcg under the tongue daily. , Disp: , Rfl:    fluticasone (FLONASE) 50 MCG/ACT nasal spray, USE 2 SPRAYS INTO BOTH NOSTRILS DAILY AS NEEDED FOR ALLERGIES OR RHINITIS, Disp: 48 g, Rfl: 1   HYDROcodone-acetaminophen (NORCO/VICODIN) 5-325 MG tablet, Take 1 tablet by mouth every 6 (six) hours as needed for moderate pain., Disp: 12 tablet, Rfl: 0   levocetirizine (XYZAL) 5 MG tablet, levocetirizine 5 mg tablet, Disp: , Rfl:    lidocaine (LIDODERM) 5 %, Place 1 patch onto the skin every 12 (twelve) hours. Remove & Discard patch within 12 hours or as directed by MD, Disp: 10 patch, Rfl: 0   meloxicam (MOBIC) 7.5 MG tablet, Take 7.5 mg by mouth daily., Disp: , Rfl:    nystatin (MYCOSTATIN) 100000 UNIT/ML suspension, USE AS DIRECTED 5 MLS (500,000 UNITS TOTAL) IN THE MOUTH OR THROAT 4 (FOUR) TIMES DAILY., Disp: 120 mL, Rfl: 0   pantoprazole (PROTONIX) 40 MG tablet, Take 1 tablet (40 mg total) by mouth daily. For GERD/acid reflux, Disp: 90 tablet, Rfl: 1   predniSONE (STERAPRED UNI-PAK 21 TAB) 10 MG (21) TBPK tablet, Take as directed on package.  (60 mg po on day 1, 50 mg po on day 2...), Disp: 21 tablet, Rfl: 0   REFRESH CELLUVISC 1 % GEL, , Disp: , Rfl:    RESTASIS 0.05 % ophthalmic emulsion, Instill 1 drop into  both eyes twice a day, Disp: , Rfl:    tiZANidine (ZANAFLEX) 4 MG tablet, Take 1 tablet (4 mg total) by mouth every 8 (eight) hours as needed for muscle spasms (muscle tightness)., Disp: 30 tablet, Rfl: 0   TURMERIC PO, Take by mouth., Disp: , Rfl:    TYRVAYA 0.03 MG/ACT SOLN, , Disp: , Rfl:    ipratropium (ATROVENT) 0.06 % nasal spray, Place 2 sprays into both nostrils 4 (four) times daily. (Patient not taking: Reported on 12/14/2020), Disp: 15 mL, Rfl: 12   rosuvastatin (CRESTOR) 10 MG tablet, Take 10 mg by mouth at bedtime. (Patient not taking: Reported on 12/14/2020), Disp: , Rfl:    Allergies  Allergen Reactions   Celecoxib Anxiety and Itching     Social History   Tobacco Use   Smoking  status: Never   Smokeless tobacco: Never  Vaping Use   Vaping Use: Never used  Substance Use Topics   Alcohol use: No   Drug use: No      Chart Review Today: I personally reviewed active problem list, medication list, allergies, family history, social history, health maintenance, notes from last encounter, lab results, imaging with the patient/caregiver today.   Review of Systems  Constitutional: Negative.   HENT: Negative.    Eyes: Negative.   Respiratory: Negative.    Cardiovascular: Negative.   Gastrointestinal: Negative.   Endocrine: Negative.   Genitourinary: Negative.   Musculoskeletal: Negative.   Skin: Negative.   Allergic/Immunologic: Negative.   Neurological: Negative.   Hematological: Negative.   Psychiatric/Behavioral: Negative.    All other systems reviewed and are negative.     Objective:   Vitals:   09/25/21 1523  BP: 114/74  Pulse: 84  Resp: 14  Temp: 98 F (36.7 C)  TempSrc: Oral  SpO2: 97%  Weight: 135 lb 9.6 oz (61.5 kg)  Height: '5\' 7"'$  (1.702 m)    Body mass index is 21.24 kg/m.  Physical Exam Vitals and nursing note reviewed.  Constitutional:      Appearance: She is well-developed.  HENT:     Head: Normocephalic and atraumatic.     Nose: Nose normal.  Eyes:     General:        Right eye: No discharge.        Left eye: No discharge.     Conjunctiva/sclera: Conjunctivae normal.  Neck:     Trachea: No tracheal deviation.  Cardiovascular:     Rate and Rhythm: Normal rate and regular rhythm.     Pulses: Normal pulses.     Heart sounds: Normal heart sounds.  Pulmonary:     Effort: Pulmonary effort is normal. No respiratory distress.     Breath sounds: No stridor.  Abdominal:     Palpations: Abdomen is soft.  Musculoskeletal:     Comments: No midline tenderness from cervical spine, lower thoracic and lumbar spine with some mild midline ttp No paraspinal muscle ttp from cervical spine  Low thoracic and lumber paraspinal muscle  TTP Decreased ROM of back 5/5 strength bilaterally with dorsiflexion, plantarflexion, flexion and extension at knees, and flexion and extension at hips Grossly normal sensation to light touch to bilateral lower extremities  Skin:    General: Skin is warm and dry.     Findings: No rash.  Neurological:     Mental Status: She is alert.     Motor: No abnormal muscle tone.     Coordination: Coordination normal.     Gait: Gait abnormal.  Psychiatric:        Behavior: Behavior normal.     Results for orders placed or performed in visit on 12/14/20  CBC with Differential/Platelet  Result Value Ref Range   WBC 3.2 (L) 4.0 - 10.5 K/uL   RBC 4.38 3.87 - 5.11 MIL/uL   Hemoglobin 12.6 12.0 - 15.0 g/dL   HCT 39.5 36.0 - 46.0 %   MCV 90.2 80.0 - 100.0 fL   MCH 28.8 26.0 - 34.0 pg   MCHC 31.9 30.0 - 36.0 g/dL   RDW 13.6 11.5 - 15.5 %   Platelets 185 150 - 400 K/uL   nRBC 0.0 0.0 - 0.2 %   Neutrophils Relative % 45 %   Neutro Abs 1.5 (L) 1.7 - 7.7 K/uL   Lymphocytes Relative 38 %   Lymphs Abs 1.2 0.7 - 4.0 K/uL   Monocytes Relative 15 %   Monocytes Absolute 0.5 0.1 - 1.0 K/uL   Eosinophils Relative 0 %   Eosinophils Absolute 0.0 0.0 - 0.5 K/uL   Basophils Relative 2 %   Basophils Absolute 0.1 0.0 - 0.1 K/uL   Immature Granulocytes 0 %   Abs Immature Granulocytes 0.01 0.00 - 0.07 K/uL       Assessment & Plan:     ICD-10-CM   1. Acute midline thoracic back pain  M54.6 DG Thoracic Spine 4V    Ambulatory referral to Physical Therapy    DG Thoracic Spine W/Swimmers    2. Acute midline low back pain without sciatica  M54.50 DG Lumbar Spine Complete    Ambulatory referral to Physical Therapy    3. Breast cancer screening by mammogram  Z12.31 MM Digital Screening    4. Osteoporosis without current pathological fracture, unspecified osteoporosis type  M81.0 DG Lumbar Spine Complete    DG Thoracic Spine 4V    Ambulatory referral to Physical Therapy     Imaging due to hx of  osteoporosis, duration of pain and midline tenderness today No radicular sx Overall pain moderate, but has gradually improved. Recommend PT to eval and tx She tried muscle relaxers without improvement  She will do NSAIDs, heat tx, no strenuous activity, f/up with PT and complete Xrays today If not better in another month or so may need spine/ortho or MRI Currently she presents like a low back strain, it was improving, she started to do more physical activity then strained it again.  No red flags today, but Xray with osteoporosis hx I feel are indicated to r/o compression fx     Delsa Grana, PA-C 09/25/21 3:43 PM

## 2021-09-27 NOTE — Telephone Encounter (Signed)
Pt called, results and information relayed to patient and spouse per her request.

## 2021-09-27 NOTE — Telephone Encounter (Unsigned)
Copied from [place]. Topic: General - Other >> Sep 27, 2021  1:46 PM Pawlus, Brayton Layman A wrote: Reason for CRM: Pt called in to go over latest xray / lab results, please advise.

## 2021-10-12 ENCOUNTER — Telehealth: Payer: Self-pay | Admitting: Family Medicine

## 2021-10-12 NOTE — Telephone Encounter (Signed)
Copied from [place] [id number]. Topic: Medicare AWV >> Oct 12, 2021 11:41 AM Jae Dire wrote: Reason for CRM:  No answer unable to leave a message for patient to call back and schedule Medicare Annual Wellness Visit (AWV) in office.   If unable to come into the office for AWV,  please offer to do virtually or by telephone.  Last AWV: 06/27/2020  Please schedule at anytime with [place].  30 minute appointment for Virtual or phone 45 minute appointment for in office or Initial virtual/phone  Any questions, please contact me at [phone]

## 2021-11-01 ENCOUNTER — Encounter: Payer: Self-pay | Admitting: Physical Therapy

## 2021-11-01 ENCOUNTER — Ambulatory Visit: Payer: Medicare HMO | Attending: Family Medicine | Admitting: Physical Therapy

## 2021-11-01 DIAGNOSIS — M5459 Other low back pain: Secondary | ICD-10-CM | POA: Diagnosis not present

## 2021-11-01 DIAGNOSIS — M810 Age-related osteoporosis without current pathological fracture: Secondary | ICD-10-CM | POA: Diagnosis not present

## 2021-11-01 DIAGNOSIS — M546 Pain in thoracic spine: Secondary | ICD-10-CM | POA: Diagnosis not present

## 2021-11-01 DIAGNOSIS — M545 Low back pain, unspecified: Secondary | ICD-10-CM | POA: Insufficient documentation

## 2021-11-01 NOTE — Therapy (Signed)
[facility] [address], [place], [address] Phone: [phone]   Fax:  [phone]  Physical Therapy Evaluation  Patient Details  Name: Ebony [name] MRN: [id number] Date of Birth: 08-16-1950 No data recorded  Encounter Date: 11/01/2021   PT End of Session - 11/01/21 1023     Visit Number 1    Number of Visits 18    Date for PT Re-Evaluation 01/03/22    Authorization - Visit Number 1    Progress Note Due on Visit 10    PT Start Time 1005    PT Stop Time 1045    PT Time Calculation (min) 40 min    Activity Tolerance Patient tolerated treatment well    Behavior During Therapy [facility] for tasks assessed/performed             Past Medical History:  Diagnosis Date   Allergy    Anemia    Arthritis    knees, hands   Borderline diabetes mellitus    Complication of anesthesia    slow to wake   Dry eye syndrome of both eyes    GERD (gastroesophageal reflux disease)    Hypercholesterolemia    Prehypertension 12/17/2016   Wears dentures    full upper and lower    Past Surgical History:  Procedure Laterality Date   ABDOMINAL HYSTERECTOMY     complete   COLONOSCOPY WITH PROPOFOL N/A 05/09/2017   Procedure: COLONOSCOPY WITH PROPOFOL;  Surgeon: Lucilla Lame, MD;  Location: [facility];  Service: Endoscopy;  Laterality: N/A;   ESOPHAGOGASTRODUODENOSCOPY (EGD) WITH PROPOFOL N/A 05/30/2020   Procedure: ESOPHAGOGASTRODUODENOSCOPY (EGD) WITH PROPOFOL;  Surgeon: Jonathon Bellows, MD;  Location: [facility] END[facility]COPY;  Service: Gastroenterology;  Laterality: N/A;   ESOPHAGOGASTRODUODENOSCOPY (EGD) WITH PROPOFOL N/A 08/07/2020   Procedure: ESOPHAGOGASTRODUODENOSCOPY (EGD) WITH PROPOFOL;  Surgeon: Jonathon Bellows, MD;  Location: [facility] ENDOSCOPY;  Service: Gastroenterology;  Laterality: N/A;   EYE SURGERY  08/04/2017   KNEE SURGERY Bilateral     There were no vitals filed for this visit.    Subjective Assessment - 11/01/21  1008     Pertinent History Pt is a 71 year old female presenting with midline low back. On 09/08/21 she was trying to get water off the toop of a tent in her yard from hitting the tent from underneath with a big stick. No pain during this activity, but later hurt so bad on her R side that she went to the ED. She was resting, using tylenol/advil and eventually pain started feeling better. She reports no pain over past week or so until yesterday. Yesterday 10/31/21 she push mowed her lawn and weed eated her lawn and felt good doing this and her pain returned last night. Reports her pain is bilat on either side of her spine, is very achy and sore. She started noticing the return of pain stepping into her shower (over a tub/shower). Current pain 6/10; best 0/10; worst 10/10. Pain is aggravated by push mowing (reports she can mop and sweep okay, it is more the walking), stepping over obstacles, and bending/squatting. Pain is made better tylenol and heat. Patient is retired but babysits her small grandchildren (6 between the ages of 70yr62yrs) Mon-Friday. She is having trouble picking up her small grandchildren with her back pain. She lives with her husband who is able bodied and helpful to her. She lives in a one story home but has 4 steps with a rail to enter, she  reports she has to use her rails because the arthritis in her R knee makes her feel as though the leg may buckle. She reports being ind and very active, enjoys mowing her yard, keeping up with her grandchildren, and is very active in her church that her husband pastors. Pt denies N/V, B&B changes, unexplained weight fluctuation, saddle paresthesia, fever, night sweats, or unrelenting night pain at this time.    How long can you sit comfortably? 75mns    How long can you stand comfortably? limited more by knee, cannot give time frame    How long can you walk comfortably? 131ms limited by back, reports some limitation from knee as well    Diagnostic tests  Xray current: Mild to moderate multilevel degenerative changes without an acute   osseous abnormality.    Currently in Pain? Yes    Pain Score 6     Pain Location Back    Pain Orientation Left;Right;Lower    Pain Descriptors / Indicators Aching;Dull    Pain Type Chronic pain    Pain Radiating Towards none    Pain Onset More than a month ago    Pain Frequency Intermittent    Aggravating Factors  Pain is aggravated by push mowing (reports she can mop and sweep okay, it is more the walking), stepping over obstacles, and bending/squatting.    Pain Relieving Factors Pain is made better tylenol and heat.    Effect of Pain on Daily Activities unable to complete heavy ADLs and play with grandchildrem                   OBJECTIVE  Mental Status Patient is oriented to person, place and time.  Recent memory is intact.  Remote memory is intact.  Attention span and concentration are intact.  Expressive speech is intact.  Patient's fund of knowledge is within normal limits for educational level.   Gross Musculoskeletal Assessment Tremor: None Bulk: Normal Tone: Normal No visible step-off along spinal column   Gait L hip hike with R circumduction to clear R foot d/t no knee flex in R swing, minimal pelvic/trunk rotation Bilat knee valgus maintained throughout Maintained R lateral trunk lean throughout   Posture Slight pitched forward, lower crossed Slight increased thoracic kyphosis Wt shifted to LLE with RLE knee locked in ext; bilat knee valgus   AROM (degrees) R/L (all movements include overpressure unless otherwise stated) R knee flex 100d Lumbar forward flexion (65): 25% with concordant pain sign Lumbar extension (30): WNL Lumbar lateral flexion (25): WNL bilat with pain bilat to opposite lumbar paraspinals/QL Thoracic and Lumbar rotation (30 degrees): WNL with pain with L rotation only  Hip IR (0-45): 50% bilat Hip ER (0-45): 25% limited Hip Flexion (0-125): L  114d R 100d Hip Abduction (0-40): WNL Hip extension (0-15): approx 5d bilat *Indicates pain   PROM (degrees) R knee flex 1006d Lumbar/thoracic motions PROM = AROM Hip IR (0-45): 50% bilat Hip ER (0-45): 25% limited Hip Flexion (0-125): L WNL R 110d Hip Abduction (0-40): WNL Hip extension (0-15): R: 15 L: 10   Repeated Movements No centralization or peripheralization of symptoms with repeated lumbar extension or flexion.    Strength (out of 5) R/L 2+/3- Hip flexion 3-/3- Hip ER 4+ bilat Hip IR 3+/3 Hip abduction 5/5 Hip adduction 2+/3- Hip extension 3-/3 Knee extension 2+/4 Knee flexion 5/5 Ankle dorsiflexion 5 Trunk flexion 5 Trunk extension 5/5 Trunk rotation *Indicates pain    Palpation Concordant pain to palpation  of bilat lower lumbar paraspinals and QL TTP 3; with 4 (unwilling to allow palpation) to deep palpation No TTP to glute complex, proximal hamstrings or tspine paraspinals  Muscle Length Hamstrings: shorted bilat [place]: shortened on LLE; unable true assessment on RLE d/t lack of knee mobility from OA- tension throughout Marcello Moores: shortened bilat [place]:  WNL    Passive Accessory Intervertebral Motion (PAIVM) Pt denies reproduction of back pain with CPA L1-L5 and UPA bilaterally L1-L5. Generally hypomobile throughout    SPECIAL TESTS Lumbar Radiculopathy and Discogenic: Centralization and Peripheralization (SN 92, -LR 0.12): Negative Slump (SN 83, -LR 0.32): R: Negative L: Negative SLR (SN 92, -LR 0.29): R: Negative L:  Negative Crossed SLR (SP 90): R: Negative L: Negative   Facet Joint: Extension-Rotation (SN 100, -LR 0.0): R: Positive L: Positive   Lumbar Foraminal Stenosis: Lumbar quadrant (SN 70): R: Negative L: Negative   Hip: FABER (SN 81): R: Negative L: Negative FADIR (SN 94): R: Positive L: Positive Hip scour (SN 50): R: Positive L: Positive   Piriformis Syndrome: FAIR Test (SN 88, SP 83): R: Positive L:  Positive   Functional Tasks STS unable to complete without UE support, narrow BOS with heavy pusy against chair with legs Sit to supine: manually assists RLE into bed, decreased speed for spuine to sit and all bed mobility modI Squat: unable to squat to any depth due to decreased R knee mobility and general hip strength Lifting: current lifting with knees ext, forward bend at trunk, grabbing object and maintaining elbow ext throughout (pulling with back)   Therapeutic Activity Education on lifting with lunge d/t lack of RLE mobility and bringing object or child close to self and then stand to prevent excessive lumbar strain. Education on activity adjustment for 2 weeks while in acute period with heat modality with patient verbalizing understanding of recommendations.  Education on lack of RLE mobility and hip strengt impact on lumbar stabilization with HEP of heel slides and modified childs pose to only point of stretch/discomfort with understadning.               Objective measurements completed on examination: See above findings.                PT Education - 11/01/21 1022     Education Details Patient was educated on diagnosis, anatomy and pathology involved, prognosis, role of PT, and was given an HEP, demonstrating exercise with proper form following verbal and tactile cues, and was given a paper hand out to continue exercise at home. Pt was educated on and agreed to plan of care.    Person(s) Educated Patient    Methods Explanation;Demonstration;Verbal cues;Tactile cues    Comprehension Verbalized understanding;Returned demonstration;Verbal cues required;Tactile cues required              PT Short Term Goals - 11/01/21 1131       PT SHORT TERM GOAL #1   Title Pt will be independent with HEP in order to improve strength and decrease back pain in order to improve pain-free function at home    Baseline 11/01/21 HEP given    Time 4    Period Weeks     Status New    Target Date 11/29/21               PT Long Term Goals - 11/01/21 1132       PT LONG TERM GOAL #1   Title Pt will decrease worst back pain as reported on  NPRS by at least 2 points in order to demonstrate clinically significant reduction in back pain.    Baseline 11/01/21 10/10    Time 8    Period Weeks    Status New      PT LONG TERM GOAL #2   Title Patient will increase FOTO score to 61 to demonstrate predicted increase in functional mobility to complete ADLs    Baseline 11/01/21 29    Time 8    Period Weeks    Status New      PT LONG TERM GOAL #3   Title Pt will decrease 5TSTS to at least 13 seconds in order to demonstrate age matched norms in LE strength to ind transfer    Baseline 11/01/21 unable to stand without UE support    Time 8    Period Weeks    Status New                    Plan - 11/01/21 1122     Clinical Impression Statement Pt is a 71 year old female presenting s/p bilat lumbar paraspinal (possible QL involvement) sprain. Impairments in diffuse pain (increased sensitivity), decreased lumbar, hip and R knee mobility, decreased lumbar, core and bilat hip strength, abnormal posture, abnormal gait, and decreased activity tolerance. Activity limitations in push mowing, walking, lifting, forward bending, and squatting; inhibiting participation in heavy ADLs, and being the caregiver for her 6 small grandchildren. Pt will benefit from skilled PT to address aforementioned impairments to obtain safest level of independence.    Personal Factors and Comorbidities Age;Comorbidity 3+;Past/Current Experience;Comorbidity 2    Comorbidities OA, GERD, preHTN    Examination-Activity Limitations Stairs;Stand;Sit;Lift;Locomotion Level;Carry;Caring for Others;Sleep;Transfers;Squat    Examination-Participation Restrictions Church;Meal Prep;Cleaning;Community Activity;Occupation;Laundry    Stability/Clinical Decision Making Evolving/Moderate complexity     Clinical Decision Making Moderate    Rehab Potential Good    PT Frequency 2x / week    PT Duration 8 weeks    PT Treatment/Interventions ADLs/Self Care Home Management;Iontophoresis '4mg'$ /ml Dexamethasone;Therapeutic exercise;Stair training;Passive range of motion;Joint Manipulations;Spinal Manipulations;Dry needling;Splinting;Manual techniques;Neuromuscular re-education;Therapeutic activities;DME Instruction;Moist Heat;Electrical Stimulation;Cryotherapy;Aquatic Therapy;Ultrasound;Traction;Gait training;Functional mobility training;Balance training;Patient/family education    PT Next Visit Plan balance assessment (TUG, maybe DGI), stair assessment, review functional lifting and HEP    PT Home Exercise Plan modified childs pose, heel slide, lifting mechanics, activity mod    Consulted and Agree with Plan of Care Patient             Patient will benefit from skilled therapeutic intervention in order to improve the following deficits and impairments:  Abnormal gait, Decreased activity tolerance, Decreased balance, Decreased endurance, Decreased range of motion, Decreased strength, Increased fascial restricitons, Hypomobility, Impaired flexibility, Impaired tone, Increased muscle spasms, Difficulty walking, Decreased mobility, Decreased coordination, Improper body mechanics, Pain, Postural dysfunction  Visit Diagnosis: Other low back pain     Problem List Patient Active Problem List   Diagnosis Date Noted   Pain in joint of left knee 12/14/2020   Arthritis of left knee 12/14/2020   Lumbar radiculopathy 12/14/2020   Lumbar spondylosis 12/14/2020   Dysphagia 06/12/2020   Gastroesophageal reflux disease 05/02/2020   Palpitation 09/29/2019   Anemia, unspecified 09/25/2019   Chest pain 02/25/2019   B12 deficiency 12/30/2018   Hyperlipidemia 07/01/2018   Prediabetes 07/01/2018   Neutropenia, unspecified type ([place]) 12/18/2016   Loss of weight 12/17/2016   Adjustment disorder with mixed  anxiety and depressed mood 10/11/2015   Heartburn 10/11/2015   Arthritis of knee, degenerative  07/18/2015   Monterius Rolf DPT Durwin Reges, PT 11/01/2021, 11:44 AM  [facility] [address] [place], [address] Phone: [phone]   Fax:  [phone]  Name: FAUSTINE TATES MRN: [id number] Date of Birth: Jan 30, 1951

## 2021-11-05 ENCOUNTER — Telehealth: Payer: Self-pay | Admitting: Family Medicine

## 2021-11-05 NOTE — Telephone Encounter (Signed)
Patient called in states, received call from [employer] about filling erythromycin(she thinks is the name of the medication). I not seeing this n her list. Please call back to clarify

## 2021-11-07 NOTE — Telephone Encounter (Signed)
Caled pt back and asked what medication she was needing refills, she states unsure if it was Erythromycin. I stated to her if its that the medication she is needing she needs to contact the provider office who gave her that because no one here in this office prescribed that to her before.

## 2021-11-09 ENCOUNTER — Encounter: Payer: Medicare HMO | Admitting: Physical Therapy

## 2021-11-13 ENCOUNTER — Ambulatory Visit: Payer: Medicare HMO | Admitting: Physical Therapy

## 2021-11-16 ENCOUNTER — Encounter: Payer: Self-pay | Admitting: Physical Therapy

## 2021-11-16 ENCOUNTER — Ambulatory Visit: Payer: Medicare HMO | Attending: Family Medicine | Admitting: Physical Therapy

## 2021-11-16 DIAGNOSIS — M5459 Other low back pain: Secondary | ICD-10-CM | POA: Diagnosis not present

## 2021-11-16 NOTE — Therapy (Signed)
[facility] [address] [place], [address] Phone: [phone]   Fax:  [phone]  Physical Therapy Treatment  Patient Details  Name: Ebony [name] MRN: [id number] Date of Birth: January 27, 1951 No data recorded  Encounter Date: 11/16/2021    Past Medical History:  Diagnosis Date   Allergy    Anemia    Arthritis    knees, hands   Borderline diabetes mellitus    Complication of anesthesia    slow to wake   Dry eye syndrome of both eyes    GERD (gastroesophageal reflux disease)    Hypercholesterolemia    Prehypertension 12/17/2016   Wears dentures    full upper and lower    Past Surgical History:  Procedure Laterality Date   ABDOMINAL HYSTERECTOMY     complete   COLONOSCOPY WITH PROPOFOL N/A 05/09/2017   Procedure: COLONOSCOPY WITH PROPOFOL;  Surgeon: Lucilla Lame, MD;  Location: [facility];  Service: Endoscopy;  Laterality: N/A;   ESOPHAGOGASTRODUODENOSCOPY (EGD) WITH PROPOFOL N/A 05/30/2020   Procedure: ESOPHAGOGASTRODUODENOSCOPY (EGD) WITH PROPOFOL;  Surgeon: Jonathon Bellows, MD;  Location: [facility] ENDOSCOPY;  Service: Gastroenterology;  Laterality: N/A;   ESOPHAGOGASTRODUODENOSCOPY (EGD) WITH PROPOFOL N/A 08/07/2020   Procedure: ESOPHAGOGASTRODUODENOSCOPY (EGD) WITH PROPOFOL;  Surgeon: Jonathon Bellows, MD;  Location: [facility] ENDOSCOPY;  Service: Gastroenterology;  Laterality: N/A;   EYE SURGERY  08/04/2017   KNEE SURGERY Bilateral     There were no vitals filed for this visit.    Ther-Ex Nustep seat UE L3 95mns for gentle hip/lumbar motion and strengthening Heel slides x12; on last one 30sec hold in max flex with forward scoot throughout 30sec  TRX squat with narrow base for max knee flex 2x 12 cuing to prevent knee valgus Squat to elevated mat table with RTB at knees for hip ER/abd activation 2x 8 with cuing to maintain knee positioning, and foot contact to floor with good carry over Bridge with RTB  at knees 2x 8 with cuing for full RLE push Review of functional lifting in staggered deadlift position with patient reporting this has been helpful to balance TUG: 13sec Modified childs pose stretch 30sec                             PT Short Term Goals - 11/01/21 1131       PT SHORT TERM GOAL #1   Title Pt will be independent with HEP in order to improve strength and decrease back pain in order to improve pain-free function at home    Baseline 11/01/21 HEP given    Time 4    Period Weeks    Status New    Target Date 11/29/21               PT Long Term Goals - 11/01/21 1132       PT LONG TERM GOAL #1   Title Pt will decrease worst back pain as reported on NPRS by at least 2 points in order to demonstrate clinically significant reduction in back pain.    Baseline 11/01/21 10/10    Time 8    Period Weeks    Status New      PT LONG TERM GOAL #2   Title Patient will increase FOTO score to 61 to demonstrate predicted increase in functional mobility to complete ADLs    Baseline 11/01/21 29    Time 8    Period Weeks  Status New      PT LONG TERM GOAL #3   Title Pt will decrease 5TSTS to at least 13 seconds in order to demonstrate age matched norms in LE strength to ind transfer    Baseline 11/01/21 unable to stand without UE support    Time 8    Period Weeks    Status New                    Patient will benefit from skilled therapeutic intervention in order to improve the following deficits and impairments:     Visit Diagnosis: No diagnosis found.     Problem List Patient Active Problem List   Diagnosis Date Noted   Pain in joint of left knee 12/14/2020   Arthritis of left knee 12/14/2020   Lumbar radiculopathy 12/14/2020   Lumbar spondylosis 12/14/2020   Dysphagia 06/12/2020   Gastroesophageal reflux disease 05/02/2020   Palpitation 09/29/2019   Anemia, unspecified 09/25/2019   Chest pain 02/25/2019   B12 deficiency  12/30/2018   Hyperlipidemia 07/01/2018   Prediabetes 07/01/2018   Neutropenia, unspecified type ([place]) 12/18/2016   Loss of weight 12/17/2016   Adjustment disorder with mixed anxiety and depressed mood 10/11/2015   Heartburn 10/11/2015   Arthritis of knee, degenerative 07/18/2015    Durwin Reges, PT 11/16/2021, 10:02 AM  [facility] [address] [place], [address] Phone: [phone]   Fax:  [phone]  Name: Ebony [name] MRN: [id number] Date of Birth: 02-03-1951

## 2021-11-21 ENCOUNTER — Encounter: Payer: Self-pay | Admitting: Physical Therapy

## 2021-11-21 ENCOUNTER — Ambulatory Visit: Payer: Medicare HMO | Admitting: Physical Therapy

## 2021-11-21 DIAGNOSIS — M5459 Other low back pain: Secondary | ICD-10-CM

## 2021-11-21 NOTE — Therapy (Signed)
OUTPATIENT PHYSICAL THERAPY TREATMENT NOTE   Patient Name: Ebony [name] MRN: [id number] DOB:[birth year], 71 y.o., female Today's Date: 11/21/2021  PCP: Delsa Grana PA REFERRING PROVIDER: Delsa Grana PA   PT End of Session - 11/21/21 1104     Visit Number 3    Number of Visits 18    Date for PT Re-Evaluation 01/03/22    Authorization - Visit Number 3    Progress Note Due on Visit 10    PT Start Time 9371    PT Stop Time 1136    PT Time Calculation (min) 38 min    Activity Tolerance Patient tolerated treatment well    Behavior During Therapy WFL for tasks assessed/performed             Past Medical History:  Diagnosis Date   Allergy    Anemia    Arthritis    knees, hands   Borderline diabetes mellitus    Complication of anesthesia    slow to wake   Dry eye syndrome of both eyes    GERD (gastroesophageal reflux disease)    Hypercholesterolemia    Prehypertension 12/17/2016   Wears dentures    full upper and lower   Past Surgical History:  Procedure Laterality Date   ABDOMINAL HYSTERECTOMY     complete   COLONOSCOPY WITH PROPOFOL N/A 05/09/2017   Procedure: COLONOSCOPY WITH PROPOFOL;  Surgeon: Lucilla Lame, MD;  Location: [place];  Service: Endoscopy;  Laterality: N/A;   ESOPHAGOGASTRODUODENOSCOPY (EGD) WITH PROPOFOL N/A 05/30/2020   Procedure: ESOPHAGOGASTRODUODENOSCOPY (EGD) WITH PROPOFOL;  Surgeon: Jonathon Bellows, MD;  Location: [facility] ENDOSCOPY;  Service: Gastroenterology;  Laterality: N/A;   ESOPHAGOGASTRODUODENOSCOPY (EGD) WITH PROPOFOL N/A 08/07/2020   Procedure: ESOPHAGOGASTRODUODENOSCOPY (EGD) WITH PROPOFOL;  Surgeon: Jonathon Bellows, MD;  Location: [facility] ENDOSCOPY;  Service: Gastroenterology;  Laterality: N/A;   EYE SURGERY  08/04/2017   KNEE SURGERY Bilateral    Patient Active Problem List   Diagnosis Date Noted   Pain in joint of left knee 12/14/2020   Arthritis of left knee 12/14/2020   Lumbar radiculopathy 12/14/2020   Lumbar  spondylosis 12/14/2020   Dysphagia 06/12/2020   Gastroesophageal reflux disease 05/02/2020   Palpitation 09/29/2019   Anemia, unspecified 09/25/2019   Chest pain 02/25/2019   B12 deficiency 12/30/2018   Hyperlipidemia 07/01/2018   Prediabetes 07/01/2018   Neutropenia, unspecified type ([place]) 12/18/2016   Loss of weight 12/17/2016   Adjustment disorder with mixed anxiety and depressed mood 10/11/2015   Heartburn 10/11/2015   Arthritis of knee, degenerative 07/18/2015    REFERRING DIAG: chronic LBP   THERAPY DIAG:  Other low back pain  Rationale for Evaluation and Treatment Rehabilitation  PERTINENT HISTORY: Pt is a 71 year old female presenting with midline low back. On 09/08/21 she was trying to get water off the toop of a tent in her yard from hitting the tent from underneath with a big stick. No pain during this activity, but later hurt so bad on her R side that she went to the ED. She was resting, using tylenol/advil and eventually pain started feeling better. She reports no pain over past week or so until yesterday. Yesterday 10/31/21 she push mowed her lawn and weed eated her lawn and felt good doing this and her pain returned last night. Reports her pain is bilat on either side of her spine, is very achy and sore. She started noticing the return of pain stepping into her shower (over a  tub/shower). Current pain 6/10; best 0/10; worst 10/10. Pain is aggravated by push mowing (reports she can mop and sweep okay, it is more the walking), stepping over obstacles, and bending/squatting. Pain is made better tylenol and heat. Patient is retired but babysits her small grandchildren (6 between the ages of 64yr43yrs) Mon-Friday. She is having trouble picking up her small grandchildren with her back pain. She lives with her husband who is able bodied and helpful to her. She lives in a one story home but has 4 steps with a rail to enter, she reports she has to use her rails because the arthritis in her R  knee makes her feel as though the leg may buckle. She reports being ind and very active, enjoys mowing her yard, keeping up with her grandchildren, and is very active in her church that her husband pastors. Pt denies N/V, B&B changes, unexplained weight fluctuation, saddle paresthesia, fever, night sweats, or unrelenting night pain at this time.  PRECAUTIONS: none  SUBJECTIVE: Pt reports doing well   PAIN:  Are you having pain? No  TODAY'S TREATMENT:  Ther-Ex Nustep seat UE L3 551ms for gentle hip/lumbar motion and strengthening TRX squat with narrow base for max knee flex x12 cuing to prevent knee valgus In mini lunge position with R foot forward R knee flex <> ext with RTB proximal to knee with add force to increase hip abd and IR (L foot remaining straight) 2x 10 with demo and max cuing initially  Squat with chair behind for safety with RTB at knees for hip ER/abd activation 2x 10 with cuing to maintain knee positioning, and foot contact to floor with good carry over RLE step up onto 6in step attempt, much difficult with sequencing of R knee flex <> ext in closed chain; changed to LLE to second step <> floor with PT manually assisting R knee movement during step up/down x12; without manual assist with VC x12 with good carryover Heel slides x12   PATIENT EDUCATION: Education details: therex form/technique Person educated: Patient Education method: ExConsulting civil engineerDemonstration, and Verbal cues Education comprehension: verbalized understanding and returned demonstration   HOME EXERCISE PROGRAM: Squat with RTB, modified childs pose, heel slide     PT Short Term Goals - 11/01/21 1131       PT SHORT TERM GOAL #1   Title Pt will be independent with HEP in order to improve strength and decrease back pain in order to improve pain-free function at home    Baseline 11/01/21 HEP given    Time 4    Period Weeks    Status New    Target Date 11/29/21              PT Long Term Goals -  11/01/21 1132       PT LONG TERM GOAL #1   Title Pt will decrease worst back pain as reported on NPRS by at least 2 points in order to demonstrate clinically significant reduction in back pain.    Baseline 11/01/21 10/10    Time 8    Period Weeks    Status New      PT LONG TERM GOAL #2   Title Patient will increase FOTO score to 61 to demonstrate predicted increase in functional mobility to complete ADLs    Baseline 11/01/21 29    Time 8    Period Weeks    Status New      PT LONG TERM GOAL #3   Title Pt will decrease  5TSTS to at least 13 seconds in order to demonstrate age matched norms in LE strength to ind transfer    Baseline 11/01/21 unable to stand without UE support    Time 8    Period Weeks    Status New              Plan - 11/21/21 1136     Clinical Impression Statement PT continued therex progression for R knee/hip mobility and strength with success. Patient is able to comply with all cuing for proper technique of therex, with mutlimodal cuing and demonstration needed. paitent is demonstrating more knee mobility between sessions, with greatest challenge in sequencing knee movement in functional capactiies (stairs and gait). Patient is motiated throughout session with no increased pain throughout session.    Personal Factors and Comorbidities Age;Comorbidity 3+;Past/Current Experience;Comorbidity 2    Comorbidities OA, GERD, preHTN    Examination-Activity Limitations Stairs;Stand;Sit;Lift;Locomotion Level;Carry;Caring for Others;Sleep;Transfers;Squat    Examination-Participation Restrictions Church;Meal Prep;Cleaning;Community Activity;Occupation;Laundry    Stability/Clinical Decision Making Evolving/Moderate complexity    Clinical Decision Making Moderate    Rehab Potential Good    PT Frequency 2x / week    PT Duration 8 weeks    PT Treatment/Interventions ADLs/Self Care Home Management;Iontophoresis '4mg'$ /ml Dexamethasone;Therapeutic exercise;Stair training;Passive  range of motion;Joint Manipulations;Spinal Manipulations;Dry needling;Splinting;Manual techniques;Neuromuscular re-education;Therapeutic activities;DME Instruction;Moist Heat;Electrical Stimulation;Cryotherapy;Aquatic Therapy;Ultrasound;Traction;Gait training;Functional mobility training;Balance training;Patient/family education    PT Next Visit Plan stair assessment,    PT Home Exercise Plan Squat with RTB modified childs pose, heel slide, lifting mechanics, activity mod    Consulted and Agree with Plan of Care Patient              Durwin Reges DPT Durwin Reges, PT 11/21/2021, 12:10 PM

## 2021-11-23 ENCOUNTER — Ambulatory Visit: Payer: Medicare HMO | Admitting: Physical Therapy

## 2021-11-28 ENCOUNTER — Encounter: Payer: Self-pay | Admitting: Physical Therapy

## 2021-11-28 ENCOUNTER — Ambulatory Visit: Payer: Medicare HMO | Admitting: Physical Therapy

## 2021-11-28 DIAGNOSIS — M5459 Other low back pain: Secondary | ICD-10-CM | POA: Diagnosis not present

## 2021-11-28 NOTE — Therapy (Signed)
OUTPATIENT PHYSICAL THERAPY TREATMENT NOTE   Patient Name: Ebony [name] MRN: [id number] DOB:07-Nov-1950, 71 y.o., female Today's Date: 11/28/2021  PCP: Delsa Grana PA REFERRING PROVIDER: Delsa Grana PA   PT End of Session - 11/28/21 1125     Visit Number 4    Number of Visits 18    Date for PT Re-Evaluation 01/03/22    Authorization - Visit Number 4    Progress Note Due on Visit 10    PT Start Time 1120    PT Stop Time 1200    PT Time Calculation (min) 40 min    Activity Tolerance Patient tolerated treatment well    Behavior During Therapy WFL for tasks assessed/performed              Past Medical History:  Diagnosis Date   Allergy    Anemia    Arthritis    knees, hands   Borderline diabetes mellitus    Complication of anesthesia    slow to wake   Dry eye syndrome of both eyes    GERD (gastroesophageal reflux disease)    Hypercholesterolemia    Prehypertension 12/17/2016   Wears dentures    full upper and lower   Past Surgical History:  Procedure Laterality Date   ABDOMINAL HYSTERECTOMY     complete   COLONOSCOPY WITH PROPOFOL N/A 05/09/2017   Procedure: COLONOSCOPY WITH PROPOFOL;  Surgeon: Lucilla Lame, MD;  Location: [place];  Service: Endoscopy;  Laterality: N/A;   ESOPHAGOGASTRODUODENOSCOPY (EGD) WITH PROPOFOL N/A 05/30/2020   Procedure: ESOPHAGOGASTRODUODENOSCOPY (EGD) WITH PROPOFOL;  Surgeon: Jonathon Bellows, MD;  Location: [facility] ENDOSCOPY;  Service: Gastroenterology;  Laterality: N/A;   ESOPHAGOGASTRODUODENOSCOPY (EGD) WITH PROPOFOL N/A 08/07/2020   Procedure: ESOPHAGOGASTRODUODENOSCOPY (EGD) WITH PROPOFOL;  Surgeon: Jonathon Bellows, MD;  Location: [facility] ENDOSCOPY;  Service: Gastroenterology;  Laterality: N/A;   EYE SURGERY  08/04/2017   KNEE SURGERY Bilateral    Patient Active Problem List   Diagnosis Date Noted   Pain in joint of left knee 12/14/2020   Arthritis of left knee 12/14/2020   Lumbar radiculopathy 12/14/2020   Lumbar  spondylosis 12/14/2020   Dysphagia 06/12/2020   Gastroesophageal reflux disease 05/02/2020   Palpitation 09/29/2019   Anemia, unspecified 09/25/2019   Chest pain 02/25/2019   B12 deficiency 12/30/2018   Hyperlipidemia 07/01/2018   Prediabetes 07/01/2018   Neutropenia, unspecified type ([place]) 12/18/2016   Loss of weight 12/17/2016   Adjustment disorder with mixed anxiety and depressed mood 10/11/2015   Heartburn 10/11/2015   Arthritis of knee, degenerative 07/18/2015    REFERRING DIAG: chronic LBP   THERAPY DIAG:  Other low back pain  Rationale for Evaluation and Treatment Rehabilitation  PERTINENT HISTORY: Pt is a 71 year old female presenting with midline low back. On 09/08/21 she was trying to get water off the toop of a tent in her yard from hitting the tent from underneath with a big stick. No pain during this activity, but later hurt so bad on her R side that she went to the ED. She was resting, using tylenol/advil and eventually pain started feeling better. She reports no pain over past week or so until yesterday. Yesterday 10/31/21 she push mowed her lawn and weed eated her lawn and felt good doing this and her pain returned last night. Reports her pain is bilat on either side of her spine, is very achy and sore. She started noticing the return of pain stepping into her shower (over  a tub/shower). Current pain 6/10; best 0/10; worst 10/10. Pain is aggravated by push mowing (reports she can mop and sweep okay, it is more the walking), stepping over obstacles, and bending/squatting. Pain is made better tylenol and heat. Patient is retired but babysits her small grandchildren (6 between the ages of 31yr31yrs) Mon-Friday. She is having trouble picking up her small grandchildren with her back pain. She lives with her husband who is able bodied and helpful to her. She lives in a one story home but has 4 steps with a rail to enter, she reports she has to use her rails because the arthritis in her R  knee makes her feel as though the leg may buckle. She reports being ind and very active, enjoys mowing her yard, keeping up with her grandchildren, and is very active in her church that her husband pastors. Pt denies N/V, B&B changes, unexplained weight fluctuation, saddle paresthesia, fever, night sweats, or unrelenting night pain at this time.  PRECAUTIONS: none  SUBJECTIVE: Pt reports doing well, no pain today. Compliance with HEP without question or concern.   PAIN:  Are you having pain? No  TODAY'S TREATMENT:  Ther-Ex Nustep seat 9 UE 13 L3 552ms for gentle hip/lumbar motion and strengthening Total gym squat L24 x10; Total gym SL squat from 90d L19 2x 8 with good carry over of initial cuing for technique Bridge x10; SL bridge 2x 6/8 with min cuing for technique initially with good carry over following Straight leg deadlift x12; with 7# DB in each hand 2x 8 with excellent carry over of initial demo and technique Squat with chair behind for safety with RTB at knees for hip ER/abd activation 2x 10 with cuing to maintain knee positioning with intermittent carry over    PATIENT EDUCATION: Education details: therex form/technique Person educated: Patient Education method: ExConsulting civil engineerDemonstration, and Verbal cues Education comprehension: verbalized understanding and returned demonstration   HOME EXERCISE PROGRAM: Squat with RTB, modified childs pose, heel slide     PT Short Term Goals - 11/01/21 1131       PT SHORT TERM GOAL #1   Title Pt will be independent with HEP in order to improve strength and decrease back pain in order to improve pain-free function at home    Baseline 11/01/21 HEP given    Time 4    Period Weeks    Status New    Target Date 11/29/21              PT Long Term Goals - 11/01/21 1132       PT LONG TERM GOAL #1   Title Pt will decrease worst back pain as reported on NPRS by at least 2 points in order to demonstrate clinically significant  reduction in back pain.    Baseline 11/01/21 10/10    Time 8    Period Weeks    Status New      PT LONG TERM GOAL #2   Title Patient will increase FOTO score to 61 to demonstrate predicted increase in functional mobility to complete ADLs    Baseline 11/01/21 29    Time 8    Period Weeks    Status New      PT LONG TERM GOAL #3   Title Pt will decrease 5TSTS to at least 13 seconds in order to demonstrate age matched norms in LE strength to ind transfer    Baseline 11/01/21 unable to stand without UE support    Time 8  Period Weeks    Status New              Plan - 11/28/21 1138     Clinical Impression Statement PT continued therex progression for increased RLE and core strengthening with success. Paiten tis able to comply with all cuing for proper technique of therex with good motivation throughout session and no increased pain. Pt conitnuing to work on improving gait with natural knee movement PT will continue progression as able.    Personal Factors and Comorbidities Age;Comorbidity 3+;Past/Current Experience;Comorbidity 2    Comorbidities OA, GERD, preHTN    Examination-Activity Limitations Stairs;Stand;Sit;Lift;Locomotion Level;Carry;Caring for Others;Sleep;Transfers;Squat    Examination-Participation Restrictions Church;Meal Prep;Cleaning;Community Activity;Occupation;Laundry    Stability/Clinical Decision Making Evolving/Moderate complexity    Clinical Decision Making Moderate    Rehab Potential Good    PT Frequency 2x / week    PT Duration 8 weeks    PT Treatment/Interventions ADLs/Self Care Home Management;Iontophoresis '4mg'$ /ml Dexamethasone;Therapeutic exercise;Stair training;Passive range of motion;Joint Manipulations;Spinal Manipulations;Dry needling;Splinting;Manual techniques;Neuromuscular re-education;Therapeutic activities;DME Instruction;Moist Heat;Electrical Stimulation;Cryotherapy;Aquatic Therapy;Ultrasound;Traction;Gait training;Functional mobility  training;Balance training;Patient/family education    PT Next Visit Plan stair assessment,    PT Home Exercise Plan Squat with RTB modified childs pose, heel slide, lifting mechanics, activity mod    Consulted and Agree with Plan of Care Patient               Durwin Reges DPT Durwin Reges, PT 11/28/2021, 11:58 AM

## 2021-11-30 ENCOUNTER — Encounter: Payer: Self-pay | Admitting: Physical Therapy

## 2021-11-30 ENCOUNTER — Ambulatory Visit: Payer: Medicare HMO | Admitting: Physical Therapy

## 2021-11-30 DIAGNOSIS — M5459 Other low back pain: Secondary | ICD-10-CM

## 2021-11-30 NOTE — Therapy (Signed)
OUTPATIENT PHYSICAL THERAPY TREATMENT NOTE   Patient Name: Ebony [name] MRN: [id number] DOB:02-12-1951, 71 y.o., female Today's Date: 11/30/2021  PCP: Delsa Grana PA REFERRING PROVIDER: Delsa Grana PA   PT End of Session - 11/30/21 0925     Visit Number 5    Number of Visits 18    Date for PT Re-Evaluation 01/03/22    Authorization - Visit Number 5    Progress Note Due on Visit 10    PT Start Time 0920    PT Stop Time 1000    PT Time Calculation (min) 40 min    Activity Tolerance Patient tolerated treatment well    Behavior During Therapy [facility] for tasks assessed/performed               Past Medical History:  Diagnosis Date   Allergy    Anemia    Arthritis    knees, hands   Borderline diabetes mellitus    Complication of anesthesia    slow to wake   Dry eye syndrome of both eyes    GERD (gastroesophageal reflux disease)    Hypercholesterolemia    Prehypertension 12/17/2016   Wears dentures    full upper and lower   Past Surgical History:  Procedure Laterality Date   ABDOMINAL HYSTERECTOMY     complete   COLONOSCOPY WITH PROPOFOL N/A 05/09/2017   Procedure: COLONOSCOPY WITH PROPOFOL;  Surgeon: Lucilla Lame, MD;  Location: [facility];  Service: Endoscopy;  Laterality: N/A;   ESOPHAGOGASTRODUODENOSCOPY (EGD) WITH PROPOFOL N/A 05/30/2020   Procedure: ESOPHAGOGASTRODUODENOSCOPY (EGD) WITH PROPOFOL;  Surgeon: Jonathon Bellows, MD;  Location: [facility] ENDOSCOPY;  Service: Gastroenterology;  Laterality: N/A;   ESOPHAGOGASTRODUODENOSCOPY (EGD) WITH PROPOFOL N/A 08/07/2020   Procedure: ESOPHAGOGASTRODUODENOSCOPY (EGD) WITH PROPOFOL;  Surgeon: Jonathon Bellows, MD;  Location: [facility] ENDOSCOPY;  Service: Gastroenterology;  Laterality: N/A;   EYE SURGERY  08/04/2017   KNEE SURGERY Bilateral    Patient Active Problem List   Diagnosis Date Noted   Pain in joint of left knee 12/14/2020   Arthritis of left knee 12/14/2020   Lumbar radiculopathy 12/14/2020   Lumbar  spondylosis 12/14/2020   Dysphagia 06/12/2020   Gastroesophageal reflux disease 05/02/2020   Palpitation 09/29/2019   Anemia, unspecified 09/25/2019   Chest pain 02/25/2019   B12 deficiency 12/30/2018   Hyperlipidemia 07/01/2018   Prediabetes 07/01/2018   Neutropenia, unspecified type ([place]) 12/18/2016   Loss of weight 12/17/2016   Adjustment disorder with mixed anxiety and depressed mood 10/11/2015   Heartburn 10/11/2015   Arthritis of knee, degenerative 07/18/2015    REFERRING DIAG: chronic LBP   THERAPY DIAG:  Other low back pain  Rationale for Evaluation and Treatment Rehabilitation  PERTINENT HISTORY: Pt is a 71 year old female presenting with midline low back. On 09/08/21 she was trying to get water off the toop of a tent in her yard from hitting the tent from underneath with a big stick. No pain during this activity, but later hurt so bad on her R side that she went to the ED. She was resting, using tylenol/advil and eventually pain started feeling better. She reports no pain over past week or so until yesterday. Yesterday 10/31/21 she push mowed her lawn and weed eated her lawn and felt good doing this and her pain returned last night. Reports her pain is bilat on either side of her spine, is very achy and sore. She started noticing the return of pain stepping into her shower (  over a tub/shower). Current pain 6/10; best 0/10; worst 10/10. Pain is aggravated by push mowing (reports she can mop and sweep okay, it is more the walking), stepping over obstacles, and bending/squatting. Pain is made better tylenol and heat. Patient is retired but babysits her small grandchildren (6 between the ages of 50yr57yrs) Mon-Friday. She is having trouble picking up her small grandchildren with her back pain. She lives with her husband who is able bodied and helpful to her. She lives in a one story home but has 4 steps with a rail to enter, she reports she has to use her rails because the arthritis in her R  knee makes her feel as though the leg may buckle. She reports being ind and very active, enjoys mowing her yard, keeping up with her grandchildren, and is very active in her church that her husband pastors. Pt denies N/V, B&B changes, unexplained weight fluctuation, saddle paresthesia, fever, night sweats, or unrelenting night pain at this time.  PRECAUTIONS: none  SUBJECTIVE: Pt reports doing well, no pain today. Compliance with HEP without question or concern. Is having some knee "buckling" since not wearing her brace.  PAIN:  Are you having pain? No  TODAY'S TREATMENT:  Ther-Ex Nustep seat 9 UE 13 L3 563ms for gentle hip/lumbar motion and strengthening Total gym SL squat from 90d L19 x10; L20 2x 8/6 with min cuing initially for technique Deadlift x12 BW with max cuing initially for knee flex; staggered deadlift x12 (RLE fwd) with good carry over of technique; staggered with 2 5# DB 2x 10 with good eventual carry over of increased RLE demand Squat with L foot balance stone to elevated mat table 2x 10 with max cuing for RLE wt shift and knee flex with decent carry over    PATIENT EDUCATION: Education details: therex form/technique Person educated: Patient Education method: Explanation, Demonstration, and Verbal cues Education comprehension: verbalized understanding and returned demonstration  Clinical Impression: PT continued therex progression for increased hip/core strength with continued RLE focus. PT continue therex progression toward functional movements with patient able to comply with all cuing for proper technique of therex with good motivation throughout session. PT will continue progression as able.   HOME EXERCISE PROGRAM: Squat with RTB, modified childs pose, heel slide     PT Short Term Goals - 11/01/21 1131       PT SHORT TERM GOAL #1   Title Pt will be independent with HEP in order to improve strength and decrease back pain in order to improve pain-free function at  home    Baseline 11/01/21 HEP given    Time 4    Period Weeks    Status New    Target Date 11/29/21              PT Long Term Goals - 11/01/21 1132       PT LONG TERM GOAL #1   Title Pt will decrease worst back pain as reported on NPRS by at least 2 points in order to demonstrate clinically significant reduction in back pain.    Baseline 11/01/21 10/10    Time 8    Period Weeks    Status New      PT LONG TERM GOAL #2   Title Patient will increase FOTO score to 61 to demonstrate predicted increase in functional mobility to complete ADLs    Baseline 11/01/21 29    Time 8    Period Weeks    Status New  PT LONG TERM GOAL #3   Title Pt will decrease 5TSTS to at least 13 seconds in order to demonstrate age matched norms in LE strength to ind transfer    Baseline 11/01/21 unable to stand without UE support    Time Peavine DPT Durwin Reges, PT 11/30/2021, 9:59 AM

## 2021-12-04 ENCOUNTER — Ambulatory Visit: Payer: Medicare HMO | Admitting: Physical Therapy

## 2021-12-06 ENCOUNTER — Ambulatory Visit: Payer: Medicare HMO | Admitting: Physical Therapy

## 2021-12-07 DIAGNOSIS — M170 Bilateral primary osteoarthritis of knee: Secondary | ICD-10-CM | POA: Diagnosis not present

## 2021-12-07 DIAGNOSIS — M25561 Pain in right knee: Secondary | ICD-10-CM | POA: Diagnosis not present

## 2021-12-10 ENCOUNTER — Ambulatory Visit: Payer: Medicare HMO | Admitting: Physical Therapy

## 2021-12-10 DIAGNOSIS — H16223 Keratoconjunctivitis sicca, not specified as Sjogren's, bilateral: Secondary | ICD-10-CM | POA: Diagnosis not present

## 2021-12-10 DIAGNOSIS — Z0100 Encounter for examination of eyes and vision without abnormal findings: Secondary | ICD-10-CM | POA: Diagnosis not present

## 2021-12-10 DIAGNOSIS — H524 Presbyopia: Secondary | ICD-10-CM | POA: Diagnosis not present

## 2021-12-14 ENCOUNTER — Ambulatory Visit: Payer: Medicare HMO | Attending: Family Medicine | Admitting: Physical Therapy

## 2021-12-14 ENCOUNTER — Encounter: Payer: Self-pay | Admitting: Physical Therapy

## 2021-12-14 ENCOUNTER — Encounter: Payer: Self-pay | Admitting: Family Medicine

## 2021-12-14 ENCOUNTER — Ambulatory Visit (INDEPENDENT_AMBULATORY_CARE_PROVIDER_SITE_OTHER): Payer: Medicare HMO | Admitting: Family Medicine

## 2021-12-14 ENCOUNTER — Ambulatory Visit: Payer: Self-pay | Admitting: *Deleted

## 2021-12-14 VITALS — BP 144/72 | HR 86 | Temp 98.2°F | Resp 16 | Ht 67.0 in | Wt 133.3 lb

## 2021-12-14 DIAGNOSIS — M25561 Pain in right knee: Secondary | ICD-10-CM | POA: Diagnosis not present

## 2021-12-14 DIAGNOSIS — R2241 Localized swelling, mass and lump, right lower limb: Secondary | ICD-10-CM

## 2021-12-14 DIAGNOSIS — M5459 Other low back pain: Secondary | ICD-10-CM | POA: Diagnosis not present

## 2021-12-14 NOTE — Progress Notes (Signed)
Patient ID: Ebony [name], female    DOB: 11/05/1950, 71 y.o.   MRN: [id number]  PCP: Delsa Grana, PA-C  Chief Complaint  Patient presents with   Pain    Right calf pain, onset for 2 weeks   Mass    Tender lump behind right knee it hurts on the touch    Subjective:   Ebony [name] is a 71 y.o. female, presents to clinic with CC of the following:  HPI  Pt has been going to ortho and PT for low back, leg and knee pain Last week no PT, did get knee injections b/l with ortho Supposed to be wearing a hinge brace she doesn't find it that helpful PT today sent [place] a msg about pain and swelling to calf - pt states the pain is behind right knee, she has not noted any swelling or redness in her leg or any change to her varicose or reticular veins. She is a small area of fullness behind her right knee that is nontender and it is also soft and fluctuant She reports that pain has been ongoing for couple weeks, did seem to get worse today with physical therapy There was a short car ride a few days ago which was less than 2 hours she did not note any significant change to her pain before or after that   She is limping, has a cane which she did not bring with her She is taking mobic and other NSAIDs per ortho  She denies redness, fever, CP, SOB  Patient Active Problem List   Diagnosis Date Noted   Pain in joint of left knee 12/14/2020   Arthritis of left knee 12/14/2020   Lumbar radiculopathy 12/14/2020   Lumbar spondylosis 12/14/2020   Dysphagia 06/12/2020   Gastroesophageal reflux disease 05/02/2020   Palpitation 09/29/2019   Anemia, unspecified 09/25/2019   Chest pain 02/25/2019   B12 deficiency 12/30/2018   Hyperlipidemia 07/01/2018   Prediabetes 07/01/2018   Neutropenia, unspecified type ([place]) 12/18/2016   Loss of weight 12/17/2016   Adjustment disorder with mixed anxiety and depressed mood 10/11/2015   Heartburn 10/11/2015   Arthritis of knee, degenerative 07/18/2015       Current Outpatient Medications:    acetaminophen (TYLENOL) 500 MG tablet, Take 1-2 tablets (500-1,000 mg total) by mouth every 8 (eight) hours as needed for moderate pain. OTC, Disp: 30 tablet, Rfl: 0   Calcium Carbonate-Vit D-Min (CALCIUM 1200 PO), Take 600 mg by mouth daily. , Disp: , Rfl:    Cholecalciferol (VITAMIN D) 125 MCG (5000 UT) CAPS, Take 1 capsule by mouth daily., Disp: , Rfl:    CINNAMON PO, Take by mouth., Disp: , Rfl:    Cyanocobalamin (VITAMIN B-12) 1000 MCG SUBL, Place 1,000 mcg under the tongue daily. , Disp: , Rfl:    fluticasone (FLONASE) 50 MCG/ACT nasal spray, USE 2 SPRAYS INTO BOTH NOSTRILS DAILY AS NEEDED FOR ALLERGIES OR RHINITIS, Disp: 48 g, Rfl: 1   levocetirizine (XYZAL) 5 MG tablet, levocetirizine 5 mg tablet, Disp: , Rfl:    lidocaine (LIDODERM) 5 %, Place 1 patch onto the skin every 12 (twelve) hours. Remove & Discard patch within 12 hours or as directed by MD, Disp: 10 patch, Rfl: 0   nystatin (MYCOSTATIN) 100000 UNIT/ML suspension, USE AS DIRECTED 5 MLS (500,000 UNITS TOTAL) IN THE MOUTH OR THROAT 4 (FOUR) TIMES DAILY., Disp: 120 mL, Rfl: 0   pantoprazole (PROTONIX) 40 MG tablet, Take 1 tablet (40 mg total)  by mouth daily. For GERD/acid reflux, Disp: 90 tablet, Rfl: 1   REFRESH CELLUVISC 1 % GEL, , Disp: , Rfl:    RESTASIS 0.05 % ophthalmic emulsion, Instill 1 drop into both eyes twice a day, Disp: , Rfl:    tiZANidine (ZANAFLEX) 4 MG tablet, Take 1 tablet (4 mg total) by mouth every 8 (eight) hours as needed for muscle spasms (muscle tightness)., Disp: 30 tablet, Rfl: 0   TURMERIC PO, Take by mouth., Disp: , Rfl:    TYRVAYA 0.03 MG/ACT SOLN, , Disp: , Rfl:    HYDROcodone-acetaminophen (NORCO/VICODIN) 5-325 MG tablet, Take 1 tablet by mouth every 6 (six) hours as needed for moderate pain. (Patient not taking: Reported on 12/14/2021), Disp: 12 tablet, Rfl: 0   ipratropium (ATROVENT) 0.06 % nasal spray, Place 2 sprays into both nostrils 4 (four) times daily.  (Patient not taking: Reported on 12/14/2020), Disp: 15 mL, Rfl: 12   meloxicam (MOBIC) 7.5 MG tablet, Take 7.5 mg by mouth daily. (Patient not taking: Reported on 12/14/2021), Disp: , Rfl:    rosuvastatin (CRESTOR) 10 MG tablet, Take 10 mg by mouth at bedtime. (Patient not taking: Reported on 12/14/2021), Disp: , Rfl:    Allergies  Allergen Reactions   Celecoxib Anxiety and Itching     Social History   Tobacco Use   Smoking status: Never   Smokeless tobacco: Never  Vaping Use   Vaping Use: Never used  Substance Use Topics   Alcohol use: No   Drug use: No      Chart Review Today: I personally reviewed active problem list, medication list, allergies, family history, social history, health maintenance, notes from last encounter, lab results, imaging with the patient/caregiver today.   Review of Systems  Constitutional: Negative.   HENT: Negative.    Eyes: Negative.   Respiratory: Negative.    Cardiovascular: Negative.   Gastrointestinal: Negative.   Endocrine: Negative.   Genitourinary: Negative.   Musculoskeletal: Negative.   Skin: Negative.   Allergic/Immunologic: Negative.   Neurological: Negative.   Hematological: Negative.   Psychiatric/Behavioral: Negative.    All other systems reviewed and are negative.      Objective:   Vitals:   12/14/21 1338  BP: (!) 144/72  Pulse: 86  Resp: 16  Temp: 98.2 F (36.8 C)  TempSrc: Oral  SpO2: 96%  Weight: 133 lb 4.8 oz (60.5 kg)  Height: '5\' 7"'$  (1.702 m)    Body mass index is 20.88 kg/m.  Physical Exam Vitals and nursing note reviewed.  Constitutional:      General: She is not in acute distress.    Appearance: She is well-developed. She is not ill-appearing, toxic-appearing or diaphoretic.  HENT:     Head: Normocephalic and atraumatic.     Nose: Nose normal.  Eyes:     General:        Right eye: No discharge.        Left eye: No discharge.     Conjunctiva/sclera: Conjunctivae normal.  Neck:     Trachea: No  tracheal deviation.  Cardiovascular:     Rate and Rhythm: Normal rate and regular rhythm.     Pulses: Normal pulses.     Heart sounds: No murmur heard.    No friction rub. No gallop.  Pulmonary:     Effort: Pulmonary effort is normal. No respiratory distress.     Breath sounds: Normal breath sounds. No stridor. No wheezing or rales.  Musculoskeletal:     Right knee: Normal  range of motion.     Right lower leg: No edema.     Left lower leg: No edema.     Comments: Antalgic gait Right knee exam limited due to pain, decreased range of motion, generalized tenderness, small 2 to 3 cm area to lateral popliteal fossa of fullness without edema, induration or erythema, no palpable cord Scattered reticular and varicose veins to both legs without any edema or erythema  Skin:    General: Skin is warm and dry.     Findings: No rash.  Neurological:     Mental Status: She is alert.     Motor: No abnormal muscle tone.     Coordination: Coordination normal.  Psychiatric:        Behavior: Behavior normal.      Results for orders placed or performed in visit on 12/14/20  CBC with Differential/Platelet  Result Value Ref Range   WBC 3.2 (L) 4.0 - 10.5 K/uL   RBC 4.38 3.87 - 5.11 MIL/uL   Hemoglobin 12.6 12.0 - 15.0 g/dL   HCT 39.5 36.0 - 46.0 %   MCV 90.2 80.0 - 100.0 fL   MCH 28.8 26.0 - 34.0 pg   MCHC 31.9 30.0 - 36.0 g/dL   RDW 13.6 11.5 - 15.5 %   Platelets 185 150 - 400 K/uL   nRBC 0.0 0.0 - 0.2 %   Neutrophils Relative % 45 %   Neutro Abs 1.5 (L) 1.7 - 7.7 K/uL   Lymphocytes Relative 38 %   Lymphs Abs 1.2 0.7 - 4.0 K/uL   Monocytes Relative 15 %   Monocytes Absolute 0.5 0.1 - 1.0 K/uL   Eosinophils Relative 0 %   Eosinophils Absolute 0.0 0.0 - 0.5 K/uL   Basophils Relative 2 %   Basophils Absolute 0.1 0.0 - 0.1 K/uL   Immature Granulocytes 0 %   Abs Immature Granulocytes 0.01 0.00 - 0.07 K/uL       Assessment & Plan:     ICD-10-CM   1. Right knee pain, unspecified  chronicity  M25.561 US Venous Img Lower Unilateral Right   acute on chronic - recommend discussing pain with ortho and f/up with PT     2. Localized swelling of right lower leg  R22.41 US Venous Img Lower Unilateral Right   right posterior knee area of soft swelling and possibly fluctuance - [place] eval     Pt has pain behind right knee - is seeing ortho for chronic b/l OA and may need joint replacement Pain today is worse than baseline after PT The past 2 weeks pt reports slightly more pain - she has seen ortho since, changed meds, and resumed PT There was no pretibial edema or assymetry to calf circumference, no pain to calf with palpation Pt notes no change to her LE varicose/reticular veins There is a small soft area to the lateral right popliteal fossa that feels little bit full but is nontender without erythema there was no palpable cord, I doubt DVT, patient does not have any symptoms concerning for PE, and I suspect most of her pain is related to her chronic knee pain.  Encouraged her to wear the brace which she has not been using per Ortho, resume her NSAIDs that were more effective previously, contact PT and Ortho on Monday to discuss symptoms and follow-up and we will get outpatient ultrasound to further assess hopefully rule out DVT and it could possibly be a Baker's cyst.  Overall more consistent with MSK pain  Delsa Grana, PA-C 12/14/21 1:53 PM

## 2021-12-14 NOTE — Telephone Encounter (Signed)
Call received from [facility] [place] at out patient . Patient reporting signs of right calf pain with varicose veins noted in clusters and positive for Homans' sign. Recently took long trip . Therapist denies patient c/o shortness of breath, swelling or chest pain. Appt scheduled with PCP today. Please advise if appt needed or go to ED for testing.     Reason for Disposition  Long-distance travel in past month (e.g., car, bus, train, plane; with trip lasting 6 or more hours)  Answer Assessment - Initial Assessment Questions 1. ONSET: "When did the pain start?"      PT noted patient with pain today  2. LOCATION: "Where is the pain located?"      Right calf area  3. PAIN: "How bad is the pain?"    (Scale 1-10; or mild, moderate, severe)   -  MILD (1-3): doesn't interfere with normal activities    -  MODERATE (4-7): interferes with normal activities (e.g., work or school) or awakens from sleep, limping    -  SEVERE (8-10): excruciating pain, unable to do any normal activities, unable to walk     na 4. WORK OR EXERCISE: "Has there been any recent work or exercise that involved this part of the body?"     Long trip riding recently  5. CAUSE: "What do you think is causing the leg pain?"     Na  6. OTHER SYMPTOMS: "Do you have any other symptoms?" (e.g., chest pain, back pain, breathing difficulty, swelling, rash, fever, numbness, weakness)     Right calf area pain with new varicose vein clusters and positive Homans' sign per PT 7. PREGNANCY: "Is there any chance you are pregnant?" "When was your last menstrual period?"     na  Protocols used: Leg Pain-A-AH

## 2021-12-14 NOTE — Therapy (Signed)
OUTPATIENT PHYSICAL THERAPY TREATMENT NOTE   Patient Name: Ebony [name] MRN: [id number] DOB:December 20, 1950, 71 y.o., female Today's Date: 12/14/2021  PCP: Delsa Grana PA REFERRING PROVIDER: Delsa Grana PA   PT End of Session - 12/14/21 1040     Visit Number 6    Number of Visits 18    Date for PT Re-Evaluation 01/03/22    Authorization - Visit Number 6    Progress Note Due on Visit 10    PT Start Time 1000    PT Stop Time 1034    PT Time Calculation (min) 34 min    Activity Tolerance Patient tolerated treatment well    Behavior During Therapy WFL for tasks assessed/performed                Past Medical History:  Diagnosis Date   Allergy    Anemia    Arthritis    knees, hands   Borderline diabetes mellitus    Complication of anesthesia    slow to wake   Dry eye syndrome of both eyes    GERD (gastroesophageal reflux disease)    Hypercholesterolemia    Prehypertension 12/17/2016   Wears dentures    full upper and lower   Past Surgical History:  Procedure Laterality Date   ABDOMINAL HYSTERECTOMY     complete   COLONOSCOPY WITH PROPOFOL N/A 05/09/2017   Procedure: COLONOSCOPY WITH PROPOFOL;  Surgeon: Lucilla Lame, MD;  Location: [place];  Service: Endoscopy;  Laterality: N/A;   ESOPHAGOGASTRODUODENOSCOPY (EGD) WITH PROPOFOL N/A 05/30/2020   Procedure: ESOPHAGOGASTRODUODENOSCOPY (EGD) WITH PROPOFOL;  Surgeon: Jonathon Bellows, MD;  Location: [facility] ENDOSCOPY;  Service: Gastroenterology;  Laterality: N/A;   ESOPHAGOGASTRODUODENOSCOPY (EGD) WITH PROPOFOL N/A 08/07/2020   Procedure: ESOPHAGOGASTRODUODENOSCOPY (EGD) WITH PROPOFOL;  Surgeon: Jonathon Bellows, MD;  Location: [facility] ENDOSCOPY;  Service: Gastroenterology;  Laterality: N/A;   EYE SURGERY  08/04/2017   KNEE SURGERY Bilateral    Patient Active Problem List   Diagnosis Date Noted   Pain in joint of left knee 12/14/2020   Arthritis of left knee 12/14/2020   Lumbar radiculopathy 12/14/2020   Lumbar  spondylosis 12/14/2020   Dysphagia 06/12/2020   Gastroesophageal reflux disease 05/02/2020   Palpitation 09/29/2019   Anemia, unspecified 09/25/2019   Chest pain 02/25/2019   B12 deficiency 12/30/2018   Hyperlipidemia 07/01/2018   Prediabetes 07/01/2018   Neutropenia, unspecified type ([place]) 12/18/2016   Loss of weight 12/17/2016   Adjustment disorder with mixed anxiety and depressed mood 10/11/2015   Heartburn 10/11/2015   Arthritis of knee, degenerative 07/18/2015    REFERRING DIAG: chronic LBP   THERAPY DIAG:  Other low back pain  Rationale for Evaluation and Treatment Rehabilitation  PERTINENT HISTORY: Pt is a 71 year old female presenting with midline low back. On 09/08/21 she was trying to get water off the toop of a tent in her yard from hitting the tent from underneath with a big stick. No pain during this activity, but later hurt so bad on her R side that she went to the ED. She was resting, using tylenol/advil and eventually pain started feeling better. She reports no pain over past week or so until yesterday. Yesterday 10/31/21 she push mowed her lawn and weed eated her lawn and felt good doing this and her pain returned last night. Reports her pain is bilat on either side of her spine, is very achy and sore. She started noticing the return of pain stepping into her  shower (over a tub/shower). Current pain 6/10; best 0/10; worst 10/10. Pain is aggravated by push mowing (reports she can mop and sweep okay, it is more the walking), stepping over obstacles, and bending/squatting. Pain is made better tylenol and heat. Patient is retired but babysits her small grandchildren (6 between the ages of 59yr59yrs) Mon-Friday. She is having trouble picking up her small grandchildren with her back pain. She lives with her husband who is able bodied and helpful to her. She lives in a one story home but has 4 steps with a rail to enter, she reports she has to use her rails because the arthritis in her R  knee makes her feel as though the leg may buckle. She reports being ind and very active, enjoys mowing her yard, keeping up with her grandchildren, and is very active in her church that her husband pastors. Pt denies N/V, B&B changes, unexplained weight fluctuation, saddle paresthesia, fever, night sweats, or unrelenting night pain at this time.  PRECAUTIONS: none  SUBJECTIVE: Arrives to PT with increased post knee pain, pain that feels more intense than usual. Nustep exercise ceased PT observed pain site, patient with varicose vein patch to site. When asked patient reports that these are new. Further inquiry of pain reveals that   PAIN:  Are you having pain? No  TODAY'S TREATMENT:  Ther-Act Nustep ceased.  BP 124/88 (+) homans test, new site of varicose veins, TTP at proximal gastroc. Pt MD called, made appt for patient at 1:25pm for screening for DVT. Pt educated to rest until appt and on signs and symptoms of emergency (SOB, heart palpations, pain so severe she is unable to walk)    PATIENT EDUCATION: Education details: therex form/technique Person educated: Patient Education method: EConsulting civil engineer Demonstration, and Verbal cues Education comprehension: verbalized understanding and returned demonstration  Clinical Impression: Pt with signs and symptoms of possible DVT this session ((+) homans test, new site of varicose veins, TTP at proximal gastroc) session ceased. PT called MD, made appt for pt to arrive in their office this afternoon. Pt educated to rest until appt and on signs and symptoms of emergency (SOB, heart palpations, pain so severe she is unable to walk) with understanding. Pt advised against HEP until clearance. PT will continue progression as able.   HOME EXERCISE PROGRAM: Squat with RTB, modified childs pose, heel slide     PT Short Term Goals - 11/01/21 1131       PT SHORT TERM GOAL #1   Title Pt will be independent with HEP in order to improve strength and  decrease back pain in order to improve pain-free function at home    Baseline 11/01/21 HEP given    Time 4    Period Weeks    Status New    Target Date 11/29/21              PT Long Term Goals - 11/01/21 1132       PT LONG TERM GOAL #1   Title Pt will decrease worst back pain as reported on NPRS by at least 2 points in order to demonstrate clinically significant reduction in back pain.    Baseline 11/01/21 10/10    Time 8    Period Weeks    Status New      PT LONG TERM GOAL #2   Title Patient will increase FOTO score to 61 to demonstrate predicted increase in functional mobility to complete ADLs    Baseline 11/01/21 29  Time 8    Period Weeks    Status New      PT LONG TERM GOAL #3   Title Pt will decrease 5TSTS to at least 13 seconds in order to demonstrate age matched norms in LE strength to ind transfer    Baseline 11/01/21 unable to stand without UE support    Time Wilmont DPT Durwin Reges, PT 12/14/2021, 10:42 AM

## 2021-12-18 ENCOUNTER — Ambulatory Visit: Payer: Medicare HMO | Admitting: Physical Therapy

## 2021-12-19 ENCOUNTER — Encounter: Payer: Medicare HMO | Admitting: Physical Therapy

## 2021-12-20 ENCOUNTER — Ambulatory Visit: Payer: Medicare HMO | Admitting: Physical Therapy

## 2021-12-21 ENCOUNTER — Encounter: Payer: Medicare HMO | Admitting: Physical Therapy

## 2021-12-26 ENCOUNTER — Ambulatory Visit: Payer: Medicare HMO | Admitting: Physical Therapy

## 2021-12-28 ENCOUNTER — Ambulatory Visit: Payer: Medicare HMO | Admitting: Physical Therapy

## 2022-01-01 ENCOUNTER — Ambulatory Visit: Payer: Medicare HMO | Admitting: Physical Therapy

## 2022-01-01 ENCOUNTER — Encounter: Payer: Self-pay | Admitting: Physical Therapy

## 2022-01-01 DIAGNOSIS — M5459 Other low back pain: Secondary | ICD-10-CM | POA: Diagnosis not present

## 2022-01-01 NOTE — Therapy (Signed)
OUTPATIENT PHYSICAL THERAPY TREATMENT NOTE   Patient Name: Ebony [name] MRN: [id number] DOB:06-26-1950, 71 y.o., female Today's Date: 01/01/2022  PCP: Delsa Grana PA REFERRING PROVIDER: Delsa Grana PA   PT End of Session - 01/01/22 1121     Visit Number 7    Number of Visits 18    Date for PT Re-Evaluation 01/03/22    Authorization - Visit Number 7    Progress Note Due on Visit 10    PT Start Time 3716    PT Stop Time 1125    PT Time Calculation (min) 40 min    Activity Tolerance Patient tolerated treatment well    Behavior During Therapy WFL for tasks assessed/performed                 Past Medical History:  Diagnosis Date   Allergy    Anemia    Arthritis    knees, hands   Borderline diabetes mellitus    Complication of anesthesia    slow to wake   Dry eye syndrome of both eyes    GERD (gastroesophageal reflux disease)    Hypercholesterolemia    Prehypertension 12/17/2016   Wears dentures    full upper and lower   Past Surgical History:  Procedure Laterality Date   ABDOMINAL HYSTERECTOMY     complete   COLONOSCOPY WITH PROPOFOL N/A 05/09/2017   Procedure: COLONOSCOPY WITH PROPOFOL;  Surgeon: Lucilla Lame, MD;  Location: [place];  Service: Endoscopy;  Laterality: N/A;   ESOPHAGOGASTRODUODENOSCOPY (EGD) WITH PROPOFOL N/A 05/30/2020   Procedure: ESOPHAGOGASTRODUODENOSCOPY (EGD) WITH PROPOFOL;  Surgeon: Jonathon Bellows, MD;  Location: [facility] ENDOSCOPY;  Service: Gastroenterology;  Laterality: N/A;   ESOPHAGOGASTRODUODENOSCOPY (EGD) WITH PROPOFOL N/A 08/07/2020   Procedure: ESOPHAGOGASTRODUODENOSCOPY (EGD) WITH PROPOFOL;  Surgeon: Jonathon Bellows, MD;  Location: [facility] ENDOSCOPY;  Service: Gastroenterology;  Laterality: N/A;   EYE SURGERY  08/04/2017   KNEE SURGERY Bilateral    Patient Active Problem List   Diagnosis Date Noted   Pain in joint of left knee 12/14/2020   Arthritis of left knee 12/14/2020   Lumbar radiculopathy 12/14/2020   Lumbar  spondylosis 12/14/2020   Dysphagia 06/12/2020   Gastroesophageal reflux disease 05/02/2020   Palpitation 09/29/2019   Anemia, unspecified 09/25/2019   Chest pain 02/25/2019   B12 deficiency 12/30/2018   Hyperlipidemia 07/01/2018   Prediabetes 07/01/2018   Neutropenia, unspecified type ([place]) 12/18/2016   Loss of weight 12/17/2016   Adjustment disorder with mixed anxiety and depressed mood 10/11/2015   Heartburn 10/11/2015   Arthritis of knee, degenerative 07/18/2015    REFERRING DIAG: chronic LBP   THERAPY DIAG:  Other low back pain  Rationale for Evaluation and Treatment Rehabilitation  PERTINENT HISTORY: Pt is a 71 year old female presenting with midline low back. On 09/08/21 she was trying to get water off the toop of a tent in her yard from hitting the tent from underneath with a big stick. No pain during this activity, but later hurt so bad on her R side that she went to the ED. She was resting, using tylenol/advil and eventually pain started feeling better. She reports no pain over past week or so until yesterday. Yesterday 10/31/21 she push mowed her lawn and weed eated her lawn and felt good doing this and her pain returned last night. Reports her pain is bilat on either side of her spine, is very achy and sore. She started noticing the return of pain stepping into  her shower (over a tub/shower). Current pain 6/10; best 0/10; worst 10/10. Pain is aggravated by push mowing (reports she can mop and sweep okay, it is more the walking), stepping over obstacles, and bending/squatting. Pain is made better tylenol and heat. Patient is retired but babysits her small grandchildren (6 between the ages of 4yr75yrs) Mon-Friday. She is having trouble picking up her small grandchildren with her back pain. She lives with her husband who is able bodied and helpful to her. She lives in a one story home but has 4 steps with a rail to enter, she reports she has to use her rails because the arthritis in her R  knee makes her feel as though the leg may buckle. She reports being ind and very active, enjoys mowing her yard, keeping up with her grandchildren, and is very active in her church that her husband pastors. Pt denies N/V, B&B changes, unexplained weight fluctuation, saddle paresthesia, fever, night sweats, or unrelenting night pain at this time.  PRECAUTIONS: none  SUBJECTIVE: Pt reports to PT following weeks off with bakers cyst. Reprots this was doing better but Saturday she [place] and it made it worse. Cyst is visible. Patient reports she puts ice + heat on it, has been resting prior to this weekned and wearing ace bandage. 6/10 pain currently.   PAIN:  Are you having pain? No  TODAY'S TREATMENT:  Ther-Ex Nustep seat 9 UE 13 L3 570ms for gentle hip/lumbar motion and strengthening   TherAct ESTIM+ ice pack IFC ESTIM 15 min at patient tolerated 18V increased to 18V through treatment at R knee area . Attempted to reduce pain and inflammation. With PT assessing patient tolerance throughout (increasing intensity as needed), monitoring skin integrity (normal), with decreased pain noted from patient   Education provided on RICE protocol for the next 2 weeks to allow baker cyst to resolve on its own- education on holding on HEP for this Education on activity modification IE meal prep sitting   PATIENT EDUCATION: Education details: therex form/technique Person educated: Patient Education method: ExConsulting civil engineerDemonstration, and Verbal cues Education comprehension: verbalized understanding and returned demonstration  Clinical Impression: Pt arrives with visible baker cyst, reporting this is better, then got worse after ambulation around zoo Saturday. PT utilized modalities (ESTIM and ice) to reduce pain with success. Patient reports no pain following interventions. PT educated patient on RICE protocol and return to PT in 2 weeks to allow for decrease in inflammation and baker cyst  pain with understanding. PT will continue progression as able.   HOME EXERCISE PROGRAM: Squat with RTB, modified childs pose, heel slide     PT Short Term Goals - 11/01/21 1131       PT SHORT TERM GOAL #1   Title Pt will be independent with HEP in order to improve strength and decrease back pain in order to improve pain-free function at home    Baseline 11/01/21 HEP given    Time 4    Period Weeks    Status New    Target Date 11/29/21              PT Long Term Goals - 11/01/21 1132       PT LONG TERM GOAL #1   Title Pt will decrease worst back pain as reported on NPRS by at least 2 points in order to demonstrate clinically significant reduction in back pain.    Baseline 11/01/21 10/10    Time 8    Period  Weeks    Status New      PT LONG TERM GOAL #2   Title Patient will increase FOTO score to 61 to demonstrate predicted increase in functional mobility to complete ADLs    Baseline 11/01/21 29    Time 8    Period Weeks    Status New      PT LONG TERM GOAL #3   Title Pt will decrease 5TSTS to at least 13 seconds in order to demonstrate age matched norms in LE strength to ind transfer    Baseline 11/01/21 unable to stand without UE support    Time Winger DPT Durwin Reges, PT 01/01/2022, 11:38 AM

## 2022-01-04 ENCOUNTER — Ambulatory Visit: Payer: Medicare HMO | Admitting: Physical Therapy

## 2022-01-14 ENCOUNTER — Ambulatory Visit: Payer: Medicare HMO | Admitting: Physical Therapy

## 2022-01-17 ENCOUNTER — Encounter: Payer: Self-pay | Admitting: Physical Therapy

## 2022-01-17 ENCOUNTER — Ambulatory Visit: Payer: Medicare HMO | Attending: Family Medicine | Admitting: Physical Therapy

## 2022-01-17 DIAGNOSIS — M5459 Other low back pain: Secondary | ICD-10-CM | POA: Insufficient documentation

## 2022-01-17 NOTE — Therapy (Signed)
OUTPATIENT PHYSICAL THERAPY TREATMENT NOTE   Patient Name: Ebony [name] MRN: [id number] DOB:May 03, 1951, 71 y.o., female Today's Date: 01/18/2022  PCP: Delsa Grana PA REFERRING PROVIDER: Delsa Grana PA   PT End of Session - 01/17/22 1152     Visit Number 8    Number of Visits 18    Date for PT Re-Evaluation 01/03/22    Authorization - Visit Number 8    Progress Note Due on Visit 10    PT Start Time 9702    PT Stop Time 1210    PT Time Calculation (min) 25 min    Activity Tolerance Patient tolerated treatment well    Behavior During Therapy WFL for tasks assessed/performed                  Past Medical History:  Diagnosis Date   Allergy    Anemia    Arthritis    knees, hands   Borderline diabetes mellitus    Complication of anesthesia    slow to wake   Dry eye syndrome of both eyes    GERD (gastroesophageal reflux disease)    Hypercholesterolemia    Prehypertension 12/17/2016   Wears dentures    full upper and lower   Past Surgical History:  Procedure Laterality Date   ABDOMINAL HYSTERECTOMY     complete   COLONOSCOPY WITH PROPOFOL N/A 05/09/2017   Procedure: COLONOSCOPY WITH PROPOFOL;  Surgeon: Lucilla Lame, MD;  Location: [place];  Service: Endoscopy;  Laterality: N/A;   ESOPHAGOGASTRODUODENOSCOPY (EGD) WITH PROPOFOL N/A 05/30/2020   Procedure: ESOPHAGOGASTRODUODENOSCOPY (EGD) WITH PROPOFOL;  Surgeon: Jonathon Bellows, MD;  Location: [facility] ENDOSCOPY;  Service: Gastroenterology;  Laterality: N/A;   ESOPHAGOGASTRODUODENOSCOPY (EGD) WITH PROPOFOL N/A 08/07/2020   Procedure: ESOPHAGOGASTRODUODENOSCOPY (EGD) WITH PROPOFOL;  Surgeon: Jonathon Bellows, MD;  Location: [facility] ENDOSCOPY;  Service: Gastroenterology;  Laterality: N/A;   EYE SURGERY  08/04/2017   KNEE SURGERY Bilateral    Patient Active Problem List   Diagnosis Date Noted   Pain in joint of left knee 12/14/2020   Arthritis of left knee 12/14/2020   Lumbar radiculopathy 12/14/2020   Lumbar  spondylosis 12/14/2020   Dysphagia 06/12/2020   Gastroesophageal reflux disease 05/02/2020   Palpitation 09/29/2019   Anemia, unspecified 09/25/2019   Chest pain 02/25/2019   B12 deficiency 12/30/2018   Hyperlipidemia 07/01/2018   Prediabetes 07/01/2018   Neutropenia, unspecified type ([place]) 12/18/2016   Loss of weight 12/17/2016   Adjustment disorder with mixed anxiety and depressed mood 10/11/2015   Heartburn 10/11/2015   Arthritis of knee, degenerative 07/18/2015    REFERRING DIAG: chronic LBP   THERAPY DIAG:  Other low back pain  Rationale for Evaluation and Treatment Rehabilitation  PERTINENT HISTORY: Pt is a 71 year old female presenting with midline low back. On 09/08/21 she was trying to get water off the toop of a tent in her yard from hitting the tent from underneath with a big stick. No pain during this activity, but later hurt so bad on her R side that she went to the ED. She was resting, using tylenol/advil and eventually pain started feeling better. She reports no pain over past week or so until yesterday. Yesterday 10/31/21 she push mowed her lawn and weed eated her lawn and felt good doing this and her pain returned last night. Reports her pain is bilat on either side of her spine, is very achy and sore. She started noticing the return of pain stepping  into her shower (over a tub/shower). Current pain 6/10; best 0/10; worst 10/10. Pain is aggravated by push mowing (reports she can mop and sweep okay, it is more the walking), stepping over obstacles, and bending/squatting. Pain is made better tylenol and heat. Patient is retired but babysits her small grandchildren (6 between the ages of 84yr32yrs) Mon-Friday. She is having trouble picking up her small grandchildren with her back pain. She lives with her husband who is able bodied and helpful to her. She lives in a one story home but has 4 steps with a rail to enter, she reports she has to use her rails because the arthritis in her R  knee makes her feel as though the leg may buckle. She reports being ind and very active, enjoys mowing her yard, keeping up with her grandchildren, and is very active in her church that her husband pastors. Pt denies N/V, B&B changes, unexplained weight fluctuation, saddle paresthesia, fever, night sweats, or unrelenting night pain at this time.  PRECAUTIONS: none  SUBJECTIVE: Pt reports no pain today. Has not been having pain since starting the RICE method. Reports no swelling today.   PAIN:  Are you having pain? No  TODAY'S TREATMENT:  Ther-Ex Nustep seat 9 UE 13 L2 513ms for gentle hip/lumbar motion and strengthening  PT reviewed the following HEP with patient with patient able to demonstrate a set of the following with min cuing for correction needed. PT educated patient on parameters of therex (how/when to inc/decrease intensity, frequency, rep/set range, stretch hold time, and purpose of therex) with verbalized understanding.  STS without UE support 1-2x/week 3x 6-12 Standing hip abd 1-2x/week 3x 6-12 Deadlift with theraband 1-2x/week 3x 6-12 SLS 30sec with counter support daily   PATIENT EDUCATION: Education details: therex form/technique Person educated: Patient Education method: ExConsulting civil engineerDemonstration, and Verbal cues Education comprehension: verbalized understanding and returned demonstration  Clinical Impression: PT reassessed goals this session where patient has met all goals to safely d/c formal PT. Patient is able to demonstrate and verbalize understanding of all HEP recommendations with minimal corrections needed. Pt given clinic contact info should further questions or concerns arise. Pt to d/c PT.    HOME EXERCISE PROGRAM: Squat with RTB, modified childs pose, heel slide     PT Short Term Goals - 11/01/21 1131       PT SHORT TERM GOAL #1   Title Pt will be independent with HEP in order to improve strength and decrease back pain in order to improve pain-free  function at home    Baseline 11/01/21 HEP given    Time 4    Period Weeks    Status New    Target Date 11/29/21              PT Long Term Goals - 01/17/22 1155       PT LONG TERM GOAL #1   Title Pt will decrease worst back pain as reported on NPRS by at least 2 points in order to demonstrate clinically significant reduction in back pain.    Baseline 11/01/21 10/10; 01/17/22 0/10    Time 8    Period Weeks    Status Achieved      PT LONG TERM GOAL #2   Title Patient will increase FOTO score to 61 to demonstrate predicted increase in functional mobility to complete ADLs    Baseline 11/01/21 29; 01/17/22 77    Time 8    Period Weeks    Status Achieved  PT LONG TERM GOAL #3   Title Pt will decrease 5TSTS to at least 13 seconds in order to demonstrate age matched norms in LE strength to ind transfer    Baseline 11/01/21 unable to stand without UE support; 01/17/22 13sec    Time 8    Period Weeks    Status Achieved                  Durwin Reges DPT Durwin Reges, PT 01/18/2022, 8:04 AM

## 2022-01-18 DIAGNOSIS — Z0100 Encounter for examination of eyes and vision without abnormal findings: Secondary | ICD-10-CM | POA: Diagnosis not present

## 2022-01-23 ENCOUNTER — Ambulatory Visit: Payer: Medicare HMO | Admitting: Physical Therapy

## 2022-01-30 ENCOUNTER — Encounter: Payer: Medicare HMO | Admitting: Physical Therapy

## 2022-01-31 ENCOUNTER — Ambulatory Visit: Payer: Medicare HMO | Admitting: Family Medicine

## 2022-02-01 ENCOUNTER — Encounter: Payer: Self-pay | Admitting: Family Medicine

## 2022-02-01 ENCOUNTER — Ambulatory Visit (INDEPENDENT_AMBULATORY_CARE_PROVIDER_SITE_OTHER): Payer: Medicare HMO | Admitting: Family Medicine

## 2022-02-01 VITALS — BP 106/74 | HR 98 | Temp 97.9°F | Resp 16 | Ht 65.25 in | Wt 131.8 lb

## 2022-02-01 DIAGNOSIS — Z23 Encounter for immunization: Secondary | ICD-10-CM | POA: Diagnosis not present

## 2022-02-01 DIAGNOSIS — R7303 Prediabetes: Secondary | ICD-10-CM

## 2022-02-01 DIAGNOSIS — D709 Neutropenia, unspecified: Secondary | ICD-10-CM

## 2022-02-01 DIAGNOSIS — M5416 Radiculopathy, lumbar region: Secondary | ICD-10-CM | POA: Diagnosis not present

## 2022-02-01 DIAGNOSIS — E782 Mixed hyperlipidemia: Secondary | ICD-10-CM | POA: Diagnosis not present

## 2022-02-01 DIAGNOSIS — K219 Gastro-esophageal reflux disease without esophagitis: Secondary | ICD-10-CM | POA: Diagnosis not present

## 2022-02-01 DIAGNOSIS — Z780 Asymptomatic menopausal state: Secondary | ICD-10-CM | POA: Diagnosis not present

## 2022-02-01 MED ORDER — PANTOPRAZOLE SODIUM 40 MG PO TBEC: 40.0000 mg | DELAYED_RELEASE_TABLET | Freq: Every day | ORAL | 3 refills | Status: DC | PRN

## 2022-02-01 MED ORDER — ROSUVASTATIN CALCIUM 5 MG PO TABS: 5.0000 mg | ORAL_TABLET | Freq: Every day | ORAL | 3 refills | Status: DC

## 2022-02-01 MED ORDER — PANTOPRAZOLE SODIUM 40 MG PO TBEC: 40.0000 mg | DELAYED_RELEASE_TABLET | Freq: Every day | ORAL | 0 refills | Status: DC

## 2022-02-01 NOTE — Patient Instructions (Addendum)
[facility] at [facility] [id number], [place], [place] [address] Scheduling phone #: [phone]  Call and schedule your mammogram and bone density tests for around the end of October or after  Avoid using NSAIDs daily - like aleve naproxen or ibuprofen and see the list of food triggers below  To try avoid what irritates your reflux   Food Choices for Gastroesophageal Reflux Disease, Adult  You can restart pantoprozole daily in the morning on empty stomach  If you have indigestion or worse symptoms while still taking pantoprozole you can also take pepcid over the counter up to 2 times a day as needed When you have gastroesophageal reflux disease (GERD), the foods you eat and your eating habits are very important. Choosing the right foods can help ease your discomfort. Think about working with a food expert (dietitian) to help you make good choices. What are tips for following this plan? Reading food labels Look for foods that are low in saturated fat. Foods that may help with your symptoms include: Foods that have less than 5% of daily value (DV) of fat. Foods that have 0 grams of trans fat. Cooking Do not fry your food. Cook your food by baking, steaming, grilling, or broiling. These are all methods that do not need a lot of fat for cooking. To add flavor, try to use herbs that are low in spice and acidity. Meal planning  Choose healthy foods that are low in fat, such as: Fruits and vegetables. Whole grains. Low-fat dairy products. Lean meats, fish, and poultry. Eat small meals often instead of eating 3 large meals each day. Eat your meals slowly in a place where you are relaxed. Avoid bending over or lying down until 2-3 hours after eating. Limit high-fat foods such as fatty meats or fried foods. Limit your intake of fatty foods, such as oils, butter, and shortening. Avoid the following as told by your doctor: Foods that cause symptoms. These may  be different for different people. Keep a food diary to keep track of foods that cause symptoms. Alcohol. Drinking a lot of liquid with meals. Eating meals during the 2-3 hours before bed. Lifestyle Stay at a healthy weight. Ask your doctor what weight is healthy for you. If you need to lose weight, work with your doctor to do so safely. Exercise for at least 30 minutes on 5 or more days each week, or as told by your doctor. Wear loose-fitting clothes. Do not smoke or use any products that contain nicotine or tobacco. If you need help quitting, ask your doctor. Sleep with the head of your bed higher than your feet. Use a wedge under the mattress or blocks under the bed frame to raise the head of the bed. Chew sugar-free gum after meals. What foods should eat?  Eat a healthy, well-balanced diet of fruits, vegetables, whole grains, low-fat dairy products, lean meats, fish, and poultry. Each person is different. Foods that may cause symptoms in one person may not cause any symptoms in another person. Work with your doctor to find foods that are safe for you. The items listed above may not be a complete list of what you can eat and drink. Contact a food expert for more options. What foods should I avoid? Limiting some of these foods may help in managing the symptoms of GERD. Everyone is different. Talk with a food expert or your doctor to help you find the exact foods to avoid, if any. Fruits  Any fruits prepared with added fat. Any fruits that cause symptoms. For some people, this may include citrus fruits, such as oranges, grapefruit, pineapple, and lemons. Vegetables Deep-fried vegetables. [place] fries. Any vegetables prepared with added fat. Any vegetables that cause symptoms. For some people, this may include tomatoes and tomato products, chili peppers, onions and garlic, and horseradish. Grains Pastries or quick breads with added fat. Meats and other proteins High-fat meats, such as fatty  beef or pork, hot dogs, ribs, ham, sausage, salami, and bacon. Fried meat or protein, including fried fish and fried chicken. Nuts and nut butters, in large amounts. Dairy Whole milk and chocolate milk. Sour cream. Cream. Ice cream. Cream cheese. Milkshakes. Fats and oils Butter. Margarine. Shortening. Ghee. Beverages Coffee and tea, with or without caffeine. Carbonated beverages. Sodas. Energy drinks. Fruit juice made with acidic fruits, such as orange or grapefruit. Tomato juice. Alcoholic drinks. Sweets and desserts Chocolate and cocoa. Donuts. Seasonings and condiments Pepper. Peppermint and spearmint. Added salt. Any condiments, herbs, or seasonings that cause symptoms. For some people, this may include curry, hot sauce, or vinegar-based salad dressings. The items listed above may not be a complete list of what you should not eat and drink. Contact a food expert for more options. Questions to ask your doctor Diet and lifestyle changes are often the first steps that are taken to manage symptoms of GERD. If diet and lifestyle changes do not help, talk with your doctor about taking medicines. Where to find more information International Foundation for Gastrointestinal Disorders: aboutgerd.org Summary When you have GERD, food and lifestyle choices are very important in easing your symptoms. Eat small meals often instead of 3 large meals a day. Eat your meals slowly and in a place where you are relaxed. Avoid bending over or lying down until 2-3 hours after eating. Limit high-fat foods such as fatty meats or fried foods. This information is not intended to replace advice given to you by your health care provider. Make sure you discuss any questions you have with your health care provider. Document Revised: 11/01/2019 Document Reviewed: 11/01/2019 Elsevier Patient Education  [place].

## 2022-02-01 NOTE — Assessment & Plan Note (Signed)
A1C ranges 5.8-6.0 however last checked it had increased to 6.4 Lab Results  Component Value Date   HGBA1C 6.4 (H) 05/02/2020  not on meds, due for recheck  Will need appt if A1C is in T2DM range

## 2022-02-01 NOTE — Assessment & Plan Note (Signed)
Sx much better controlled after PT Encouraged to avoid NSAIDs but can use tylenol, muscle relaxers, heat tx, topical diclofen get as needed

## 2022-02-01 NOTE — Assessment & Plan Note (Signed)
Sx for years, prior consult and EGD with Dr. Vicente [facility] Last was April 2022 for repeat EGD, she had esophageal candida tx with diflucan and then rechecked - I cannot find plan or result notes anywhere in chart for tx plan or f/up instructions after the April 2022 EGD She has recurrent sx, often Discussed avoiding different food/diet/medication/lifestyle triggers She can restart PPI May need to f/up with GI with her history She is not currently having difficulty with swallowing or pain, no N, no weight loss like she was previously

## 2022-02-01 NOTE — Assessment & Plan Note (Signed)
Chronic - she is seeing hem/onc, reviewed her labs today and they have been stable, will not recheck labs, defer to specialist mgmt

## 2022-02-01 NOTE — Assessment & Plan Note (Signed)
She did have some improvement with lipids when last checked (Dec 2022) but she is no longer taking meds - crestor 10 made her feel forgetful Previously prescribed crestor 20 and pravastatin  Will try lower crestor dose, labs rechecked today and after back on meds f/up 6 month to see tolerance and lipid response

## 2022-02-01 NOTE — Progress Notes (Signed)
Name: Ebony [name]   MRN: [id number]    DOB: 23-Sep-1950   Date:02/01/2022       Progress Note  Chief Complaint  Patient presents with   Follow-up   Gastroesophageal Reflux   Hyperlipidemia     Subjective:   GEORGANNA [name] is a 71 y.o. female, presents to clinic for routine f/up and labs   Prediabetes, previously A1C was 5.8-6.0, not on meds, she limits carbs/starches Lab Results  Component Value Date   HGBA1C 6.4 (H) 05/02/2020   HLD She tolerated meds in the past with some improvement on lipids - today she states she is not taking crestor and she thinks it caused her to be forgetful. Lab Results  Component Value Date   CHOL 189 05/02/2020   CHOL 214 (H) 09/15/2019   CHOL 226 (H) 02/26/2019   Lab Results  Component Value Date   HDL 82 05/02/2020   HDL 84 09/15/2019   HDL 87 02/26/2019   Lab Results  Component Value Date   LDLCALC 94 05/02/2020   LDLCALC 120 (H) 09/15/2019   LDLCALC 126 (H) 02/26/2019   Lab Results  Component Value Date   TRIG 52 05/02/2020   TRIG 33 09/15/2019   TRIG 63 02/26/2019   Lab Results  Component Value Date   CHOLHDL 2.3 05/02/2020   CHOLHDL 2.5 09/15/2019   CHOLHDL 2.6 02/26/2019   No results found for: "LDLDIRECT"  GERD - continued sx, she is out of PPI meds, she did previously consult with GI She is still on a lot of otc vitamins and supplements No current dysphagia, abd pain, weight loss She easily gets reflux and indigestion  Due for mammo, bone density and multiple immunizations She refuses the vaccines today     Current Outpatient Medications:    acetaminophen (TYLENOL) 500 MG tablet, Take 1-2 tablets (500-1,000 mg total) by mouth every 8 (eight) hours as needed for moderate pain. OTC, Disp: 30 tablet, Rfl: 0   Calcium Carbonate-Vit D-Min (CALCIUM 1200 PO), Take 600 mg by mouth daily. , Disp: , Rfl:    Cholecalciferol (VITAMIN D) 125 MCG (5000 UT) CAPS, Take 1 capsule by mouth daily., Disp: , Rfl:    CINNAMON  PO, Take by mouth., Disp: , Rfl:    Cyanocobalamin (VITAMIN B-12) 1000 MCG SUBL, Place 1,000 mcg under the tongue daily. , Disp: , Rfl:    fluticasone (FLONASE) 50 MCG/ACT nasal spray, USE 2 SPRAYS INTO BOTH NOSTRILS DAILY AS NEEDED FOR ALLERGIES OR RHINITIS, Disp: 48 g, Rfl: 1   ipratropium (ATROVENT) 0.06 % nasal spray, Place 2 sprays into both nostrils 4 (four) times daily., Disp: 15 mL, Rfl: 12   levocetirizine (XYZAL) 5 MG tablet, levocetirizine 5 mg tablet, Disp: , Rfl:    lidocaine (LIDODERM) 5 %, Place 1 patch onto the skin every 12 (twelve) hours. Remove & Discard patch within 12 hours or as directed by MD, Disp: 10 patch, Rfl: 0   meloxicam (MOBIC) 7.5 MG tablet, Take 7.5 mg by mouth daily., Disp: , Rfl:    nystatin (MYCOSTATIN) 100000 UNIT/ML suspension, USE AS DIRECTED 5 MLS (500,000 UNITS TOTAL) IN THE MOUTH OR THROAT 4 (FOUR) TIMES DAILY., Disp: 120 mL, Rfl: 0   pantoprazole (PROTONIX) 40 MG tablet, Take 1 tablet (40 mg total) by mouth daily. For GERD/acid reflux, Disp: 90 tablet, Rfl: 1   REFRESH CELLUVISC 1 % GEL, , Disp: , Rfl:    RESTASIS 0.05 % ophthalmic emulsion, Instill 1 drop into  both eyes twice a day, Disp: , Rfl:    rosuvastatin (CRESTOR) 10 MG tablet, Take 10 mg by mouth at bedtime., Disp: , Rfl:    tiZANidine (ZANAFLEX) 4 MG tablet, Take 1 tablet (4 mg total) by mouth every 8 (eight) hours as needed for muscle spasms (muscle tightness)., Disp: 30 tablet, Rfl: 0   TURMERIC PO, Take by mouth., Disp: , Rfl:    TYRVAYA 0.03 MG/ACT SOLN, , Disp: , Rfl:    HYDROcodone-acetaminophen (NORCO/VICODIN) 5-325 MG tablet, Take 1 tablet by mouth every 6 (six) hours as needed for moderate pain. (Patient not taking: Reported on 02/01/2022), Disp: 12 tablet, Rfl: 0  Patient Active Problem List   Diagnosis Date Noted   Pain in joint of left knee 12/14/2020   Arthritis of left knee 12/14/2020   Lumbar radiculopathy 12/14/2020   Lumbar spondylosis 12/14/2020   Dysphagia 06/12/2020    Gastroesophageal reflux disease 05/02/2020   Palpitation 09/29/2019   Anemia, unspecified 09/25/2019   Chest pain 02/25/2019   B12 deficiency 12/30/2018   Hyperlipidemia 07/01/2018   Prediabetes 07/01/2018   Neutropenia, unspecified type ([place]) 12/18/2016   Loss of weight 12/17/2016   Adjustment disorder with mixed anxiety and depressed mood 10/11/2015   Heartburn 10/11/2015   Arthritis of knee, degenerative 07/18/2015    Past Surgical History:  Procedure Laterality Date   ABDOMINAL HYSTERECTOMY     complete   COLONOSCOPY WITH PROPOFOL N/A 05/09/2017   Procedure: COLONOSCOPY WITH PROPOFOL;  Surgeon: Lucilla Lame, MD;  Location: [place];  Service: Endoscopy;  Laterality: N/A;   ESOPHAGOGASTRODUODENOSCOPY (EGD) WITH PROPOFOL N/A 05/30/2020   Procedure: ESOPHAGOGASTRODUODENOSCOPY (EGD) WITH PROPOFOL;  Surgeon: Jonathon Bellows, MD;  Location: [facility] ENDOSCOPY;  Service: Gastroenterology;  Laterality: N/A;   ESOPHAGOGASTRODUODENOSCOPY (EGD) WITH PROPOFOL N/A 08/07/2020   Procedure: ESOPHAGOGASTRODUODENOSCOPY (EGD) WITH PROPOFOL;  Surgeon: Jonathon Bellows, MD;  Location: [facility] ENDOSCOPY;  Service: Gastroenterology;  Laterality: N/A;   EYE SURGERY  08/04/2017   KNEE SURGERY Bilateral     Family History  Problem Relation Age of Onset   Hypertension Mother    Stroke Mother        4 mini strokes   Thyroid disease Mother    Kidney failure Father    Diabetes Father    Hypertension Father    Kidney disease Father    Emphysema Maternal Grandmother    Diabetes Maternal Grandmother    Hypertension Maternal Grandmother    Diabetes Maternal Grandfather    Alcohol abuse Maternal Grandfather    Diabetes Paternal Grandmother    Heart attack Paternal Grandmother    Emphysema Paternal Grandfather    Diabetes Paternal Grandfather    Heart disease Paternal Grandfather    Thyroid disease Sister    Vitamin D deficiency Sister    Arthritis Sister    COPD Sister    Fibromyalgia Sister     Hypertension Sister    Hyperlipidemia Sister    Asthma Sister    Thyroid disease Sister    Breast cancer Other    Cancer Neg Hx     Social History   Tobacco Use   Smoking status: Never   Smokeless tobacco: Never  Vaping Use   Vaping Use: Never used  Substance Use Topics   Alcohol use: No   Drug use: No     Allergies  Allergen Reactions   Celecoxib Anxiety and Itching    Health Maintenance  Topic Date Due   MAMMOGRAM  02/22/2021   COVID-19 Vaccine (4 -  Pfizer series) 02/17/2022 (Originally 04/27/2020)   COLONOSCOPY (Pts 45-14yr Insurance coverage will need to be confirmed)  05/10/2027   DEXA SCAN  Completed   Hepatitis C Screening  Completed   HPV VACCINES  Aged Out   Pneumonia Vaccine 72 Years old  Discontinued   INFLUENZA VACCINE  Discontinued   TETANUS/TDAP  Discontinued   Zoster Vaccines- Shingrix  Discontinued    Chart Review Today: I personally reviewed active problem list, medication list, allergies, family history, social history, health maintenance, notes from last encounter, lab results, imaging with the patient/caregiver today.   Review of Systems  Constitutional: Negative.   HENT: Negative.    Eyes: Negative.   Respiratory: Negative.    Cardiovascular: Negative.   Gastrointestinal: Negative.   Endocrine: Negative.   Genitourinary: Negative.   Musculoskeletal: Negative.   Skin: Negative.   Allergic/Immunologic: Negative.   Neurological: Negative.   Hematological: Negative.   Psychiatric/Behavioral: Negative.    All other systems reviewed and are negative.    Objective:   Vitals:   02/01/22 1111  BP: 106/74  Pulse: 98  Resp: 16  Temp: 97.9 F (36.6 C)  TempSrc: Oral  SpO2: 98%  Weight: 131 lb 12.8 oz (59.8 kg)  Height: 5' 5.25" (1.657 m)    Body mass index is 21.76 kg/m.  Physical Exam Vitals and nursing note reviewed.  Constitutional:      General: She is not in acute distress.    Appearance: Normal appearance. She is  well-developed and normal weight. She is not ill-appearing, toxic-appearing or diaphoretic.  HENT:     Head: Normocephalic and atraumatic.     Right Ear: External ear normal.     Left Ear: External ear normal.     Nose: Nose normal.  Eyes:     General: No scleral icterus.       Right eye: No discharge.        Left eye: No discharge.     Conjunctiva/sclera: Conjunctivae normal.  Neck:     Trachea: No tracheal deviation.  Cardiovascular:     Rate and Rhythm: Normal rate and regular rhythm.     Pulses: Normal pulses.     Heart sounds: Normal heart sounds. No murmur heard.    No friction rub. No gallop.  Pulmonary:     Effort: Pulmonary effort is normal. No respiratory distress.     Breath sounds: Normal breath sounds. No stridor. No wheezing, rhonchi or rales.  Abdominal:     General: Bowel sounds are normal. There is no distension.     Palpations: Abdomen is soft.     Tenderness: There is no abdominal tenderness. There is no guarding or rebound.  Musculoskeletal:     Right lower leg: No edema.     Left lower leg: No edema.  Skin:    General: Skin is warm and dry.     Findings: No rash.  Neurological:     Mental Status: She is alert. Mental status is at baseline.     Motor: No abnormal muscle tone.     Coordination: Coordination normal.  Psychiatric:        Mood and Affect: Mood normal.        Behavior: Behavior normal.         Assessment & Plan:   Problem List Items Addressed This Visit       Digestive   Gastroesophageal reflux disease    Sx for years, prior consult and EGD with Dr. [facility] Last  was April 2022 for repeat EGD, she had esophageal candida tx with diflucan and then rechecked - I cannot find plan or result notes anywhere in chart for tx plan or f/up instructions after the April 2022 EGD She has recurrent sx, often Discussed avoiding different food/diet/medication/lifestyle triggers She can restart PPI May need to f/up with GI with her history She is  not currently having difficulty with swallowing or pain, no N, no weight loss like she was previously      Relevant Medications   pantoprazole (PROTONIX) 40 MG tablet   pantoprazole (PROTONIX) 40 MG tablet     Nervous and Auditory   Lumbar radiculopathy    Sx much better controlled after PT Encouraged to avoid NSAIDs but can use tylenol, muscle relaxers, heat tx, topical diclofen get as needed        Other   Hyperlipidemia - Primary (Chronic)    She did have some improvement with lipids when last checked (Dec 2022) but she is no longer taking meds - crestor 10 made her feel forgetful Previously prescribed crestor 20 and pravastatin  Will try lower crestor dose, labs rechecked today and after back on meds f/up 6 month to see tolerance and lipid response      Relevant Medications   rosuvastatin (CRESTOR) 5 MG tablet   Other Relevant Orders   Lipid panel   COMPLETE METABOLIC PANEL WITH GFR   Prediabetes (Chronic)    A1C ranges 5.8-6.0 however last checked it had increased to 6.4 Lab Results  Component Value Date   HGBA1C 6.4 (H) 05/02/2020  not on meds, due for recheck  Will need appt if A1C is in T2DM range       Relevant Orders   Hemoglobin A1c   COMPLETE METABOLIC PANEL WITH GFR   Neutropenia, unspecified type ([place])    Chronic - she is seeing hem/onc, reviewed her labs today and they have been stable, will not recheck labs, defer to specialist mgmt      Other Visit Diagnoses     Need for influenza vaccination       refuses   Need for pneumococcal vaccination       refuses   Need for shingles vaccine       refuses   Postmenopausal estrogen deficiency       Relevant Orders   DG Bone Density        Extensive education and instructions given today in exam room and typed up on AVS regarding GERD diet/lifestyle meds She was instructed to call and schedule mammogram and bone density  Immunizations/vaccines offered and declined today   Return in about 6 months  (around 08/02/2022) for GERD cholesterol and med changes/labs.   Delsa Grana, PA-C 02/01/22 11:22 AM

## 2022-02-02 LAB — LIPID PANEL
Cholesterol: 240 mg/dL — ABNORMAL HIGH (ref <200)
HDL: 77 mg/dL (ref >=50)
LDL Cholesterol (Calc): 149 mg/dL (calc) — ABNORMAL HIGH
Non-HDL Cholesterol (Calc): 163 mg/dL (calc) — ABNORMAL HIGH (ref <130)
Total CHOL/HDL Ratio: 3.1 (calc) (ref <5.0)
Triglycerides: 50 mg/dL (ref <150)

## 2022-02-02 LAB — COMPLETE METABOLIC PANEL WITH GFR
AG Ratio: 1.3 (calc) (ref 1.0–2.5)
ALT: 11 U/L (ref 6–29)
AST: 13 U/L (ref 10–35)
Albumin: 3.6 g/dL (ref 3.6–5.1)
Alkaline phosphatase (APISO): 79 U/L (ref 37–153)
BUN/Creatinine Ratio: 24 (calc) — ABNORMAL HIGH (ref 6–22)
BUN: 12 mg/dL (ref 7–25)
CO2: 30 mmol/L (ref 20–32)
Calcium: 9 mg/dL (ref 8.6–10.4)
Chloride: 105 mmol/L (ref 98–110)
Creat: 0.5 mg/dL — ABNORMAL LOW (ref 0.60–1.00)
Globulin: 2.8 g/dL (calc) (ref 1.9–3.7)
Glucose, Bld: 96 mg/dL (ref 65–99)
Potassium: 4.2 mmol/L (ref 3.5–5.3)
Sodium: 141 mmol/L (ref 135–146)
Total Bilirubin: 0.4 mg/dL (ref 0.2–1.2)
Total Protein: 6.4 g/dL (ref 6.1–8.1)
eGFR: 100 mL/min/{1.73_m2} (ref >=60)

## 2022-02-02 LAB — HEMOGLOBIN A1C
Hgb A1c MFr Bld: 6.2 % of total Hgb — ABNORMAL HIGH (ref <5.7)
Mean Plasma Glucose: 131 mg/dL
eAG (mmol/L): 7.3 mmol/L

## 2022-02-06 ENCOUNTER — Encounter: Payer: Medicare HMO | Admitting: Physical Therapy

## 2022-02-08 ENCOUNTER — Encounter: Payer: Medicare HMO | Admitting: Physical Therapy

## 2022-02-19 ENCOUNTER — Telehealth: Payer: Self-pay | Admitting: *Deleted

## 2022-02-19 NOTE — Telephone Encounter (Signed)
Pt.notified

## 2022-02-19 NOTE — Telephone Encounter (Signed)
Result note read to pt. Pt states she is already taking Crestor '5mg'$  and does not want to continue, last took Friday. Clarified she understood taking alternate days is an option, understands.  States she would like to try diet modification and requests recommendations for any supplements that may help in lowering cholesterol.  Please advise  " Please call pt with lab results: Please give lab results: Kidney, liver function normal, A1C is still in prediabetes range and a little lower than last labs - went down to 6.2 cholesterol again very high and her risk is high enough that we should restart or try statin medications again to reduce the cholesterol and reduce risk of heart attack/stroke  Continue healthy diet efforts -  Avoid fried foods, fatty foods (avoid trans fat and saturated fat), increase whole grain and veggies. As previously discussed we will do lower dose statin - she can start with 5 mg once daily, reduce to every other day if she has bothersome SE   The 10-year ASCVD risk score (Arnett DK, et al., 2019) is: 12.4%   Values used to calculate the score:     Age: 71 years     Sex: Female     Is Non-Hispanic African American: Yes     Diabetic: No     Tobacco smoker: No     Systolic Blood Pressure: 505 mmHg     Is BP treated: No     HDL Cholesterol: 77 mg/dL     Total Cholesterol: 240 mg/dL   Need to repeat her lipids and cmp after about 3 months of being back on crestor."

## 2022-02-19 NOTE — Telephone Encounter (Signed)
Do you need her to do an appointment for recommendations?

## 2022-04-19 DIAGNOSIS — M25561 Pain in right knee: Secondary | ICD-10-CM | POA: Diagnosis not present

## 2022-04-19 DIAGNOSIS — M170 Bilateral primary osteoarthritis of knee: Secondary | ICD-10-CM | POA: Diagnosis not present

## 2022-04-19 DIAGNOSIS — M25562 Pain in left knee: Secondary | ICD-10-CM | POA: Diagnosis not present

## 2022-05-20 IMAGING — MG DIGITAL SCREENING BILAT W/ TOMO W/ CAD
6 of 12 series · 6 of 36 positions shown · non-contrast
Comparison: Previous exam(s).

CLINICAL DATA: Screening.

EXAM:
DIGITAL SCREENING BILATERAL MAMMOGRAM WITH TOMO AND CAD

[R MLO synth-2D (1 of 2)]
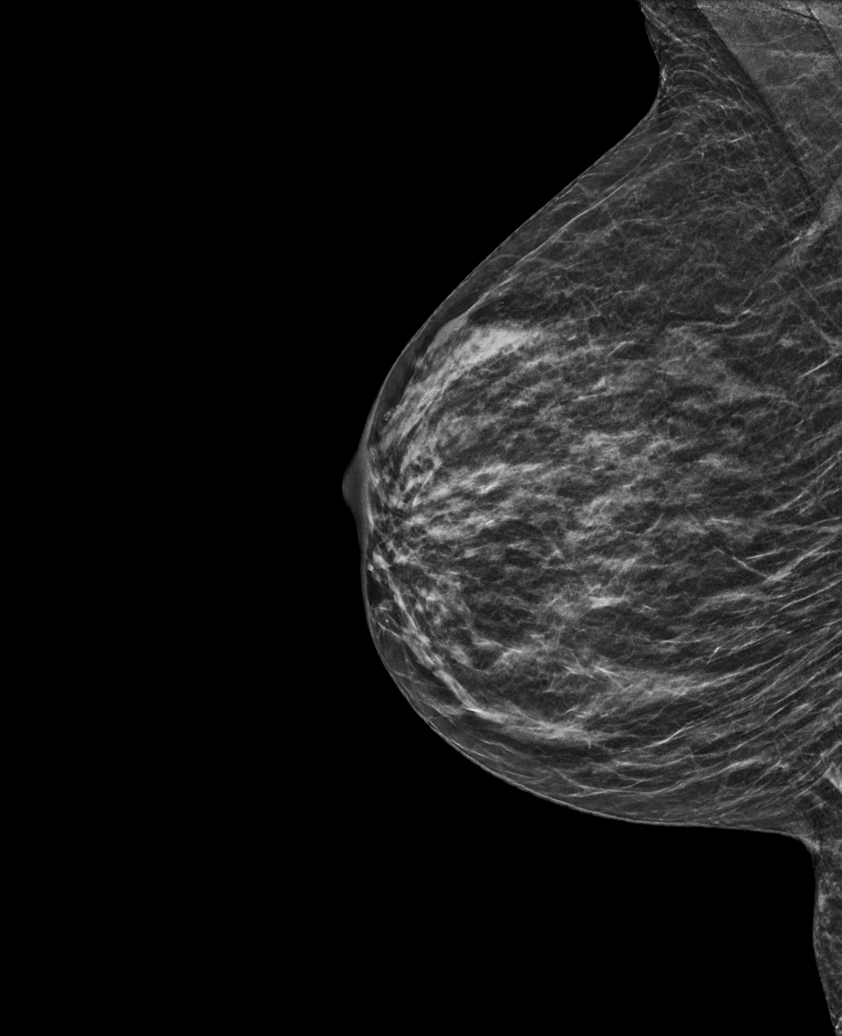

[R MLO synth-2D (2 of 2)]
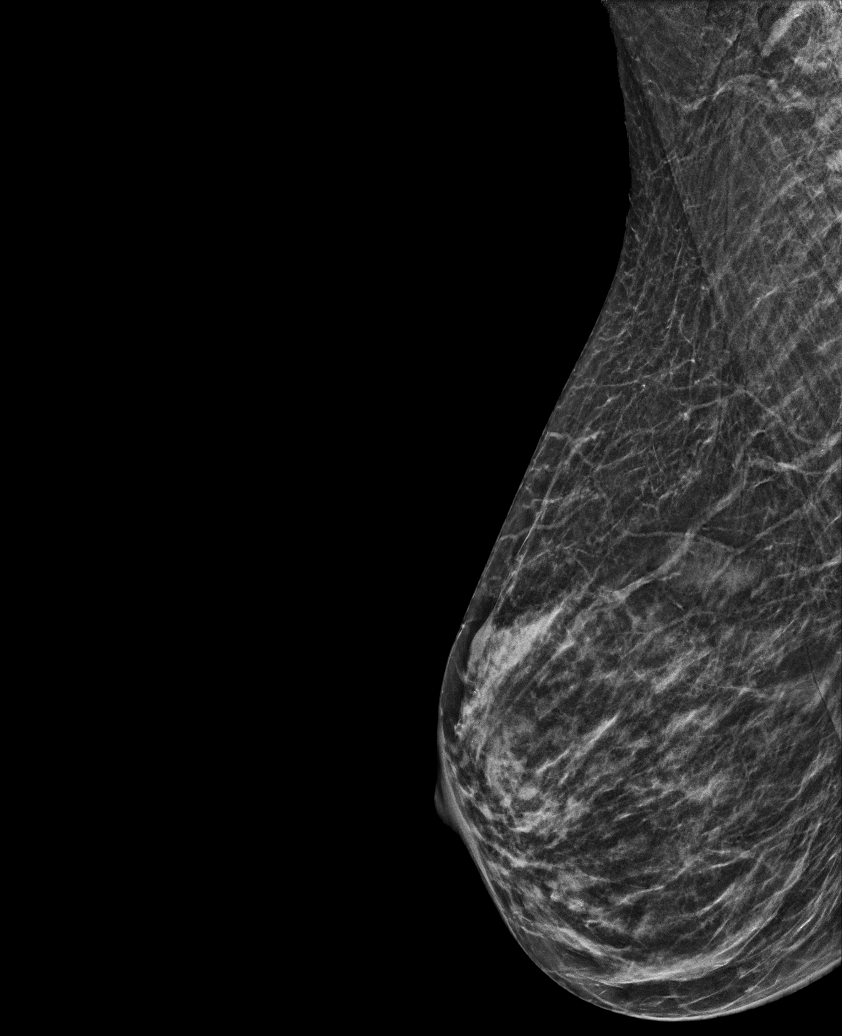

[L MLO synth-2D]
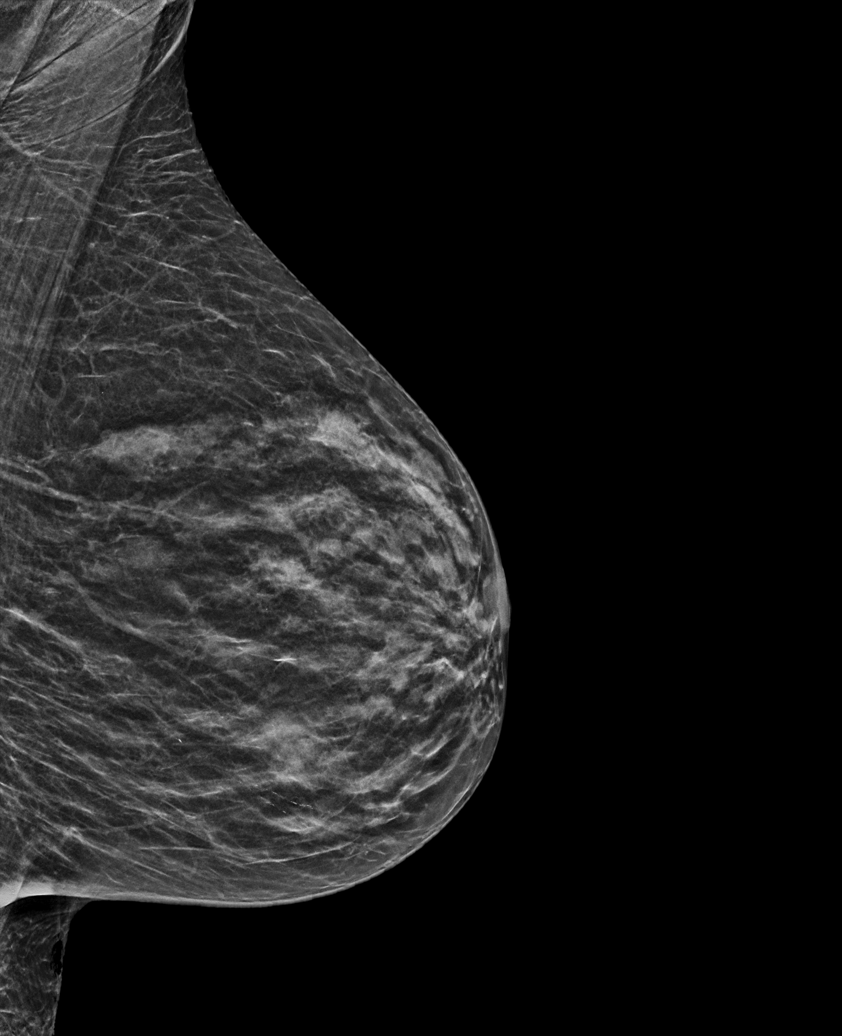

[R XCCL synth-2D]
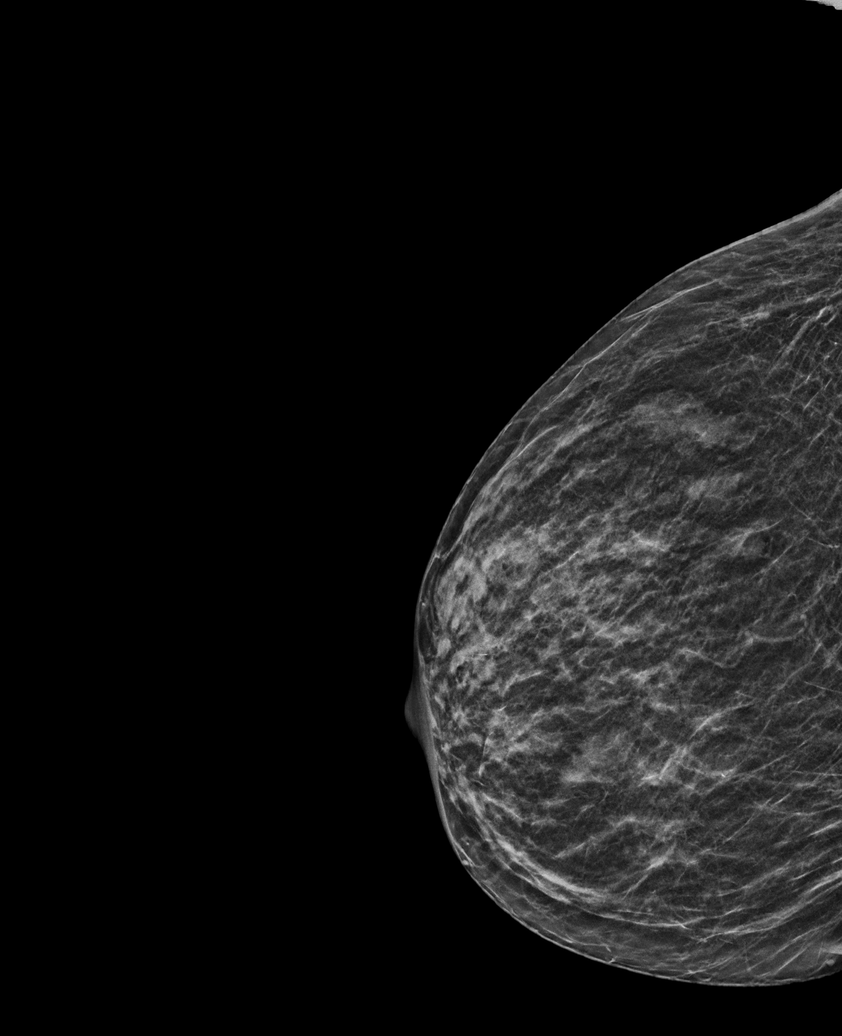

[L CC synth-2D]
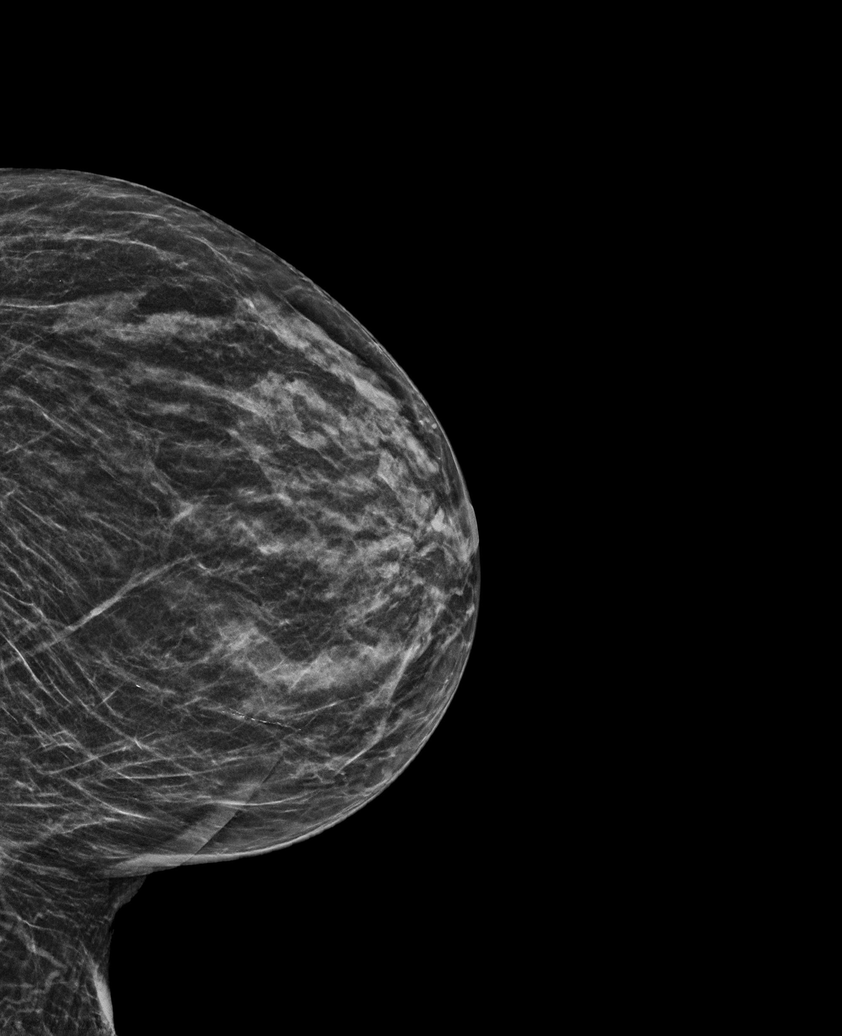

[R CC synth-2D]
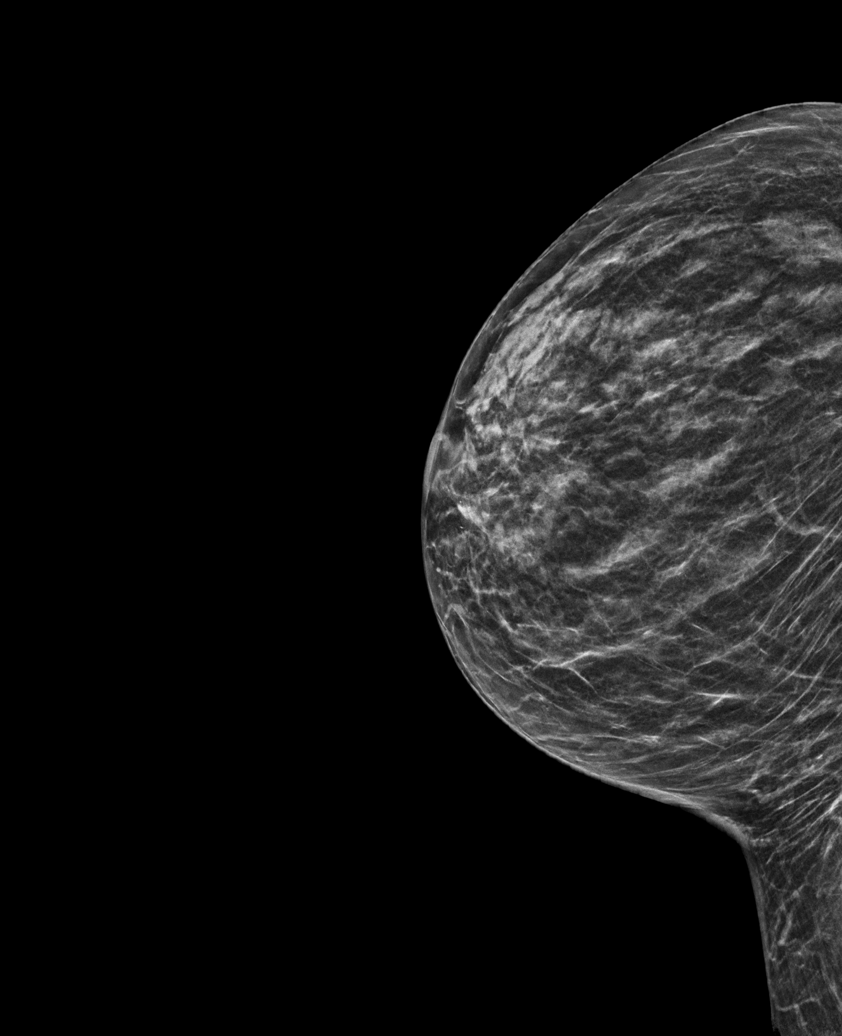

[6 of 36 positions shown; findings below may reference images not displayed]

ACR Breast Density Category b: There are scattered areas of
fibroglandular density.
FINDINGS: There are no findings suspicious for malignancy. Images were
processed with CAD.
IMPRESSION: No mammographic evidence of malignancy. A result letter of this
screening mammogram will be mailed directly to the patient.

RECOMMENDATION:
Screening mammogram in one year. (Code:[id number])

BI-RADS CATEGORY  1: Negative.

## 2022-06-28 ENCOUNTER — Ambulatory Visit (INDEPENDENT_AMBULATORY_CARE_PROVIDER_SITE_OTHER): Payer: Medicare HMO

## 2022-06-28 VITALS — Ht 65.0 in | Wt 120.0 lb

## 2022-06-28 DIAGNOSIS — Z0000 Encounter for general adult medical examination without abnormal findings: Secondary | ICD-10-CM | POA: Diagnosis not present

## 2022-06-28 DIAGNOSIS — Z1231 Encounter for screening mammogram for malignant neoplasm of breast: Secondary | ICD-10-CM

## 2022-06-28 NOTE — Progress Notes (Signed)
I connected with  [name] on 06/28/22 by a audio enabled telemedicine application and verified that I am speaking with the correct person using two identifiers.  Patient Location: Home  Provider Location: Office/Clinic  I discussed the limitations of evaluation and management by telemedicine. The patient expressed understanding and agreed to proceed.  Subjective:   TIMBERLYNN STANBACK is a 72 y.o. female who presents for Medicare Annual (Subsequent) preventive examination.  Review of Systems         Objective:    Today's Vitals   06/28/22 1036  Weight: 120 lb (54.4 kg)  Height: '5\' 5"'$  (1.651 m)   Body mass index is 19.97 kg/m.     11/01/2021   10:22 AM 09/08/2021    7:23 AM 09/08/2020    6:28 PM 08/07/2020    7:06 AM 06/27/2020    9:05 AM 06/16/2020   10:37 AM 03/17/2020    6:02 PM  Advanced Directives  Does Patient Have a Medical Advance Directive? No No No No No No No  Would patient like information on creating a medical advance directive? No - Patient declined   No - Patient declined Yes (MAU/Ambulatory/Procedural Areas - Information given) No - Patient declined     Current Medications (verified) Outpatient Encounter Medications as of 06/28/2022  Medication Sig   acetaminophen (TYLENOL) 500 MG tablet Take 1-2 tablets (500-1,000 mg total) by mouth every 8 (eight) hours as needed for moderate pain. OTC   Calcium Carbonate-Vit D-Min (CALCIUM 1200 PO) Take 600 mg by mouth daily.    Cholecalciferol (VITAMIN D) 125 MCG (5000 UT) CAPS Take 1 capsule by mouth daily.   CINNAMON PO Take by mouth.   Cyanocobalamin (VITAMIN B-12) 1000 MCG SUBL Place 1,000 mcg under the tongue daily.    ipratropium (ATROVENT) 0.06 % nasal spray Place 2 sprays into both nostrils 4 (four) times daily.   levocetirizine (XYZAL) 5 MG tablet levocetirizine 5 mg tablet   lidocaine (LIDODERM) 5 % Place 1 patch onto the skin every 12 (twelve) hours. Remove & Discard patch within 12 hours or as directed by MD    meloxicam (MOBIC) 7.5 MG tablet Take 7.5 mg by mouth daily.   nystatin (MYCOSTATIN) 100000 UNIT/ML suspension USE AS DIRECTED 5 MLS (500,000 UNITS TOTAL) IN THE MOUTH OR THROAT 4 (FOUR) TIMES DAILY.   pantoprazole (PROTONIX) 40 MG tablet Take 1 tablet (40 mg total) by mouth daily as needed. For GERD/acid reflux   pantoprazole (PROTONIX) 40 MG tablet Take 1 tablet (40 mg total) by mouth daily.   REFRESH CELLUVISC 1 % GEL    RESTASIS 0.05 % ophthalmic emulsion Instill 1 drop into both eyes twice a day   tiZANidine (ZANAFLEX) 4 MG tablet Take 1 tablet (4 mg total) by mouth every 8 (eight) hours as needed for muscle spasms (muscle tightness).   TURMERIC PO Take by mouth.   TYRVAYA 0.03 MG/ACT SOLN    rosuvastatin (CRESTOR) 5 MG tablet Take 1 tablet (5 mg total) by mouth daily. (Patient not taking: Reported on 06/28/2022)   No facility-administered encounter medications on file as of 06/28/2022.    Allergies (verified) Celecoxib   History: Past Medical History:  Diagnosis Date   Adjustment disorder with mixed anxiety and depressed mood 10/11/2015   Allergy    Anemia    Arthritis    knees, hands   Borderline diabetes mellitus    Complication of anesthesia    slow to wake   Dry eye syndrome of both  eyes    Dysphagia 06/12/2020   GERD (gastroesophageal reflux disease)    Hypercholesterolemia    Loss of weight 12/17/2016   Pain in joint of left knee 12/14/2020   Palpitation 09/29/2019   Prehypertension 12/17/2016   Wears dentures    full upper and lower   Past Surgical History:  Procedure Laterality Date   ABDOMINAL HYSTERECTOMY     complete   COLONOSCOPY WITH PROPOFOL N/A 05/09/2017   Procedure: COLONOSCOPY WITH PROPOFOL;  Surgeon: Lucilla Lame, MD;  Location: [place];  Service: Endoscopy;  Laterality: N/A;   ESOPHAGOGASTRODUODENOSCOPY (EGD) WITH PROPOFOL N/A 05/30/2020   Procedure: ESOPHAGOGASTRODUODENOSCOPY (EGD) WITH PROPOFOL;  Surgeon: Jonathon Bellows, MD;  Location: [facility]  ENDOSCOPY;  Service: Gastroenterology;  Laterality: N/A;   ESOPHAGOGASTRODUODENOSCOPY (EGD) WITH PROPOFOL N/A 08/07/2020   Procedure: ESOPHAGOGASTRODUODENOSCOPY (EGD) WITH PROPOFOL;  Surgeon: Jonathon Bellows, MD;  Location: [facility] ENDOSCOPY;  Service: Gastroenterology;  Laterality: N/A;   EYE SURGERY  08/04/2017   KNEE SURGERY Bilateral    Family History  Problem Relation Age of Onset   Hypertension Mother    Stroke Mother        4 mini strokes   Thyroid disease Mother    Kidney failure Father    Diabetes Father    Hypertension Father    Kidney disease Father    Emphysema Maternal Grandmother    Diabetes Maternal Grandmother    Hypertension Maternal Grandmother    Diabetes Maternal Grandfather    Alcohol abuse Maternal Grandfather    Diabetes Paternal Grandmother    Heart attack Paternal Grandmother    Emphysema Paternal Grandfather    Diabetes Paternal Grandfather    Heart disease Paternal Grandfather    Thyroid disease Sister    Vitamin D deficiency Sister    Arthritis Sister    COPD Sister    Fibromyalgia Sister    Hypertension Sister    Hyperlipidemia Sister    Asthma Sister    Thyroid disease Sister    Breast cancer Other    Cancer Neg Hx    Social History   Socioeconomic History   Marital status: Married    Spouse name: Not on file   Number of children: 3   Years of education: Not on file   Highest education level: High school graduate  Occupational History   Not on file  Tobacco Use   Smoking status: Never   Smokeless tobacco: Never  Vaping Use   Vaping Use: Never used  Substance and Sexual Activity   Alcohol use: No   Drug use: No   Sexual activity: Not Currently  Other Topics Concern   Not on file  Social History Narrative   Not on file   Social Determinants of Health   Financial Resource Strain: Low Risk  (06/28/2022)   Overall Financial Resource Strain (CARDIA)    Difficulty of Paying Living Expenses: Not hard at all  Food Insecurity: No Food  Insecurity (06/28/2022)   Hunger Vital Sign    Worried About Running Out of Food in the Last Year: Never true    Ran Out of Food in the Last Year: Never true  Transportation Needs: No Transportation Needs (06/28/2022)   PRAPARE - Hydrologist (Medical): No    Lack of Transportation (Non-Medical): No  Physical Activity: Sufficiently Active (06/28/2022)   Exercise Vital Sign    Days of Exercise per Week: 7 days    Minutes of Exercise per Session: 60 min  Stress:  No Stress Concern Present (06/28/2022)   [place]    Feeling of Stress : Not at all  Social Connections: Moderately Integrated (06/28/2022)   Social Connection and Isolation Panel [NHANES]    Frequency of Communication with Friends and Family: More than three times a week    Frequency of Social Gatherings with Friends and Family: More than three times a week    Attends Religious Services: More than 4 times per year    Active Member of [employer] or Organizations: No    Attends Music therapist: Never    Marital Status: Married    Tobacco Counseling Counseling given: Not Answered   Clinical Intake:  Pre-visit preparation completed: Yes  Pain : No/denies pain     BMI - recorded: 19.97 Nutritional Status: BMI of 19-24  Normal Nutritional Risks: Nausea/ vomitting/ diarrhea Diabetes: No  How often do you need to have someone help you when you read instructions, pamphlets, or other written materials from your doctor or pharmacy?: 1 - Never  Diabetic?no     Comments: lives w/husband 35 years Information entered by :: B.Gradie Butrick,LPN   Activities of Daily Living    02/01/2022   11:10 AM 12/14/2021    1:38 PM  In your present state of health, do you have any difficulty performing the following activities:  Hearing? 0 0  Vision? 1 0  Difficulty concentrating or making decisions? 0 0  Walking or climbing stairs? 0 0   Dressing or bathing? 0 0  Doing errands, shopping? 0 0    Patient Care Team: Delsa Grana, PA-C as PCP - General (Family Medicine) Lloyd Huger, MD as Consulting Physician (Oncology)  Indicate any recent Medical Services you may have received from other than [facility] providers in the past year (date may be approximate).     Assessment:   This is a routine wellness examination for Briselda.  Hearing/Vision screen Hearing Screening - Comments:: Adequate hearing Vision Screening - Comments:: Adequate vision w/glasses [facility] Diel[place]ine  Dietary issues and exercise activities discussed:     Goals Addressed   None    Depression Screen    06/28/2022   10:48 AM 02/01/2022   11:10 AM 12/14/2021    1:38 PM 09/25/2021    3:21 PM 09/13/2021    1:12 PM 06/27/2020    9:04 AM 05/02/2020   10:19 AM  PHQ 2/9 Scores  PHQ - 2 Score 0 0 0 3 0 0 0  PHQ- 9 Score  0 0 15       Fall Risk    06/28/2022   10:44 AM 02/01/2022   11:09 AM 12/14/2021    1:38 PM 09/25/2021    3:20 PM 09/13/2021    1:11 PM  [place] in the past year? 0 0  0 0  Number falls in past yr: 0 0 0  0  Injury with Fall? 0 0 0  0  Risk for fall due to : No Fall Risks No Fall Risks No Fall Risks No Fall Risks   Follow up Education provided;Falls prevention discussed Education provided;Falls prevention discussed Falls prevention discussed;Education provided Falls prevention discussed Falls evaluation completed    FALL RISK PREVENTION PERTAINING TO THE HOME:  Any stairs in or around the home? Yes  If so, are there any without handrails? Yes  Home free of loose throw rugs in walkways, pet beds, electrical cords, etc? Yes  Adequate lighting  in your home to reduce risk of falls? Yes   ASSISTIVE DEVICES UTILIZED TO PREVENT FALLS:  Life alert? No  Use of a cane, walker or w/c? No  Grab bars in the bathroom? Yes  Shower chair or bench in shower? Yes  Elevated toilet seat or a handicapped toilet? Yes    Cognitive Function:        05/04/2019   10:27 AM 04/24/2018   11:29 AM 02/04/2017   12:17 PM  6CIT Screen  What Year? 0 points 0 points 0 points  What month? 0 points 0 points 0 points  What time? 0 points 0 points 0 points  Count back from 20 0 points 0 points 0 points  Months in reverse 0 points 0 points 0 points  Repeat phrase 2 points 2 points 2 points  Total Score 2 points 2 points 2 points    Immunizations Immunization History  Administered Date(s) Administered   PFIZER Comirnaty(Gray Top)Covid-19 Tri-Sucrose Vaccine 03/02/2020   PFIZER(Purple Top)SARS-COV-2 Vaccination 07/10/2019, 07/31/2019    TDAP status: Due, Education has been provided regarding the importance of this vaccine. Advised may receive this vaccine at local pharmacy or Health Dept. Aware to provide a copy of the vaccination record if obtained from local pharmacy or Health Dept. Verbalized acceptance and understanding.  Flu Vaccine status: Declined, Education has been provided regarding the importance of this vaccine but patient still declined. Advised may receive this vaccine at local pharmacy or Health Dept. Aware to provide a copy of the vaccination record if obtained from local pharmacy or Health Dept. Verbalized acceptance and understanding.  Pneumococcal vaccine status: Declined,  Education has been provided regarding the importance of this vaccine but patient still declined. Advised may receive this vaccine at local pharmacy or Health Dept. Aware to provide a copy of the vaccination record if obtained from local pharmacy or Health Dept. Verbalized acceptance and understanding.   Covid-19 vaccine status: Completed vaccines  Qualifies for Shingles Vaccine? Yes   Zostavax completed No   Shingrix Completed?: No.    Education has been provided regarding the importance of this vaccine. Patient has been advised to call insurance company to determine out of pocket expense if they have not yet received this  vaccine. Advised may also receive vaccine at local pharmacy or Health Dept. Verbalized acceptance and understanding.  Screening Tests Health Maintenance  Topic Date Due   DTaP/Tdap/Td (1 - Tdap) Never done   Zoster Vaccines- Shingrix (1 of 2) Never done   MAMMOGRAM  02/22/2021   COVID-19 Vaccine (4 - 2023-24 season) 01/04/2022   DEXA SCAN  02/22/2022   INFLUENZA VACCINE  08/04/2022 (Originally 12/04/2021)   Pneumonia Vaccine 6+ Years old (1 of 1 - PCV) 09/26/2022 (Originally 09/19/2014)   Medicare Annual Wellness (AWV)  06/29/2023   COLONOSCOPY (Pts 45-44yr Insurance coverage will need to be confirmed)  05/10/2027   Hepatitis C Screening  Completed   HPV VACCINES  Aged Out    Health Maintenance  Health Maintenance Due  Topic Date Due   DTaP/Tdap/Td (1 - Tdap) Never done   Zoster Vaccines- Shingrix (1 of 2) Never done   MAMMOGRAM  02/22/2021   COVID-19 Vaccine (4 - 2023-24 season) 01/04/2022   DEXA SCAN  02/22/2022    Colorectal cancer screening: Type of screening: Colonoscopy. Completed yes. Repeat every 10 years  Mammogram status: Ordered yes. Pt provided with contact info and advised to call to schedule appt.   Bone Density status: Completed yes. Results reflect: Bone  density results: NORMAL. Repeat every 5 years.  Lung Cancer Screening: (Low Dose CT Chest recommended if Age 99-80 years, 30 pack-year currently smoking OR have quit w/in 15years.) does not qualify.   Lung Cancer Screening Referral: no  Additional Screening:  Hepatitis C Screening: does not qualify; Completed yes  Vision Screening: Recommended annual ophthalmology exams for early detection of glaucoma and other disorders of the eye. Is the patient up to date with their annual eye exam?  Yes  Who is the provider or what is the name of the office in which the patient attends annual eye exams? [facility] If pt is not established with a provider, would they like to be referred to a provider to establish  care? No .   Dental Screening: Recommended annual dental exams for proper oral hygiene  Community Resource Referral / Chronic Care Management: CRR required this visit?  No   CCM required this visit?  No      Plan:     I have personally reviewed and noted the following in the patient's chart:   Medical and social history Use of alcohol, tobacco or illicit drugs  Current medications and supplements including opioid prescriptions. Patient is not currently taking opioid prescriptions. Functional ability and status Nutritional status Physical activity Advanced directives List of other physicians Hospitalizations, surgeries, and ER visits in previous 12 months Vitals Screenings to include cognitive, depression, and falls Referrals and appointments  In addition, I have reviewed and discussed with patient certain preventive protocols, quality metrics, and best practice recommendations. A written personalized care plan for preventive services as well as general preventive health recommendations were provided to patient.     Roger Shelter, LPN   [id number]   Nurse Notes: pt is ding well. She does relays the stressors of her mother and father illnesses simultaneously. She is coping well at this time and needs no intervention at this time. MMG ordered/referral placed

## 2022-06-28 NOTE — Patient Instructions (Signed)
Ebony [name] , Thank you for taking time to come for your Medicare Wellness Visit. I appreciate your ongoing commitment to your health goals. Please review the following plan we discussed and let me know if I can assist you in the future.   These are the goals we discussed:  Goals       DIET - INCREASE WATER INTAKE      Recommend water intake of 6-8 glasses of water.       Reduce sugar intake (pt-stated)      She will try to reduce sugar intake        This is a list of the screening recommended for you and due dates:  Health Maintenance  Topic Date Due   DTaP/Tdap/Td vaccine (1 - Tdap) Never done   Zoster (Shingles) Vaccine (1 of 2) Never done   Mammogram  02/22/2021   COVID-19 Vaccine (4 - 2023-24 season) 01/04/2022   DEXA scan (bone density measurement)  02/22/2022   Flu Shot  08/04/2022*   Pneumonia Vaccine (1 of 1 - PCV) 09/26/2022*   Medicare Annual Wellness Visit  06/29/2023   Colon Cancer Screening  05/10/2027   Hepatitis C Screening: USPSTF Recommendation to screen - Ages 18-79 yo.  Completed   HPV Vaccine  Aged Out  *Topic was postponed. The date shown is not the original due date.    Advanced directives: no paperwork mailed to pt today/address confirmed  Conditions/risks identified: none  Next appointment: Follow up in one year for your annual wellness visit 07/03/2023 @ 10:45am telephone   Preventive Care 65 Years and Older, Female Preventive care refers to lifestyle choices and visits with your health care provider that can promote health and wellness. What does preventive care include? A yearly physical exam. This is also called an annual well check. Dental exams once or twice a year. Routine eye exams. Ask your health care provider how often you should have your eyes checked. Personal lifestyle choices, including: Daily care of your teeth and gums. Regular physical activity. Eating a healthy diet. Avoiding tobacco and drug use. Limiting alcohol  use. Practicing safe sex. Taking low-dose aspirin every day. Taking vitamin and mineral supplements as recommended by your health care provider. What happens during an annual well check? The services and screenings done by your health care provider during your annual well check will depend on your age, overall health, lifestyle risk factors, and family history of disease. Counseling  Your health care provider may ask you questions about your: Alcohol use. Tobacco use. Drug use. Emotional well-being. Home and relationship well-being. Sexual activity. Eating habits. History of falls. Memory and ability to understand (cognition). Work and work Statistician. Reproductive health. Screening  You may have the following tests or measurements: Height, weight, and BMI. Blood pressure. Lipid and cholesterol levels. These may be checked every 5 years, or more frequently if you are over 35 years old. Skin check. Lung cancer screening. You may have this screening every year starting at age 80 if you have a 30-pack-year history of smoking and currently smoke or have quit within the past 15 years. Fecal occult blood test (FOBT) of the stool. You may have this test every year starting at age 69. Flexible sigmoidoscopy or colonoscopy. You may have a sigmoidoscopy every 5 years or a colonoscopy every 10 years starting at age 63. Hepatitis C blood test. Hepatitis B blood test. Sexually transmitted disease (STD) testing. Diabetes screening. This is done by checking your blood sugar (glucose) after  you have not eaten for a while (fasting). You may have this done every 1-3 years. Bone density scan. This is done to screen for osteoporosis. You may have this done starting at age 60. Mammogram. This may be done every 1-2 years. Talk to your health care provider about how often you should have regular mammograms. Talk with your health care provider about your test results, treatment options, and if necessary,  the need for more tests. Vaccines  Your health care provider may recommend certain vaccines, such as: Influenza vaccine. This is recommended every year. Tetanus, diphtheria, and acellular pertussis (Tdap, Td) vaccine. You may need a Td booster every 10 years. Zoster vaccine. You may need this after age 74. Pneumococcal 13-valent conjugate (PCV13) vaccine. One dose is recommended after age 66. Pneumococcal polysaccharide (PPSV23) vaccine. One dose is recommended after age 68. Talk to your health care provider about which screenings and vaccines you need and how often you need them. This information is not intended to replace advice given to you by your health care provider. Make sure you discuss any questions you have with your health care provider. Document Released: 05/19/2015 Document Revised: 01/10/2016 Document Reviewed: 02/21/2015 Elsevier Interactive Patient Education  2017 [employer] Prevention in the Home Falls can cause injuries. They can happen to people of all ages. There are many things you can do to make your home safe and to help prevent falls. What can I do on the outside of my home? Regularly fix the edges of walkways and driveways and fix any cracks. Remove anything that might make you trip as you walk through a door, such as a raised step or threshold. Trim any bushes or trees on the path to your home. Use bright outdoor lighting. Clear any walking paths of anything that might make someone trip, such as rocks or tools. Regularly check to see if handrails are loose or broken. Make sure that both sides of any steps have handrails. Any raised decks and porches should have guardrails on the edges. Have any leaves, snow, or ice cleared regularly. Use sand or salt on walking paths during winter. Clean up any spills in your garage right away. This includes oil or grease spills. What can I do in the bathroom? Use night lights. Install grab bars by the toilet and in the  tub and shower. Do not use towel bars as grab bars. Use non-skid mats or decals in the tub or shower. If you need to sit down in the shower, use a plastic, non-slip stool. Keep the floor dry. Clean up any water that spills on the floor as soon as it happens. Remove soap buildup in the tub or shower regularly. Attach bath mats securely with double-sided non-slip rug tape. Do not have throw rugs and other things on the floor that can make you trip. What can I do in the bedroom? Use night lights. Make sure that you have a light by your bed that is easy to reach. Do not use any sheets or blankets that are too big for your bed. They should not hang down onto the floor. Have a firm chair that has side arms. You can use this for support while you get dressed. Do not have throw rugs and other things on the floor that can make you trip. What can I do in the kitchen? Clean up any spills right away. Avoid walking on wet floors. Keep items that you use a lot in easy-to-reach places. If you  need to reach something above you, use a strong step stool that has a grab bar. Keep electrical cords out of the way. Do not use floor polish or wax that makes floors slippery. If you must use wax, use non-skid floor wax. Do not have throw rugs and other things on the floor that can make you trip. What can I do with my stairs? Do not leave any items on the stairs. Make sure that there are handrails on both sides of the stairs and use them. Fix handrails that are broken or loose. Make sure that handrails are as long as the stairways. Check any carpeting to make sure that it is firmly attached to the stairs. Fix any carpet that is loose or worn. Avoid having throw rugs at the top or bottom of the stairs. If you do have throw rugs, attach them to the floor with carpet tape. Make sure that you have a light switch at the top of the stairs and the bottom of the stairs. If you do not have them, ask someone to add them for  you. What else can I do to help prevent falls? Wear shoes that: Do not have high heels. Have rubber bottoms. Are comfortable and fit you well. Are closed at the toe. Do not wear sandals. If you use a stepladder: Make sure that it is fully opened. Do not climb a closed stepladder. Make sure that both sides of the stepladder are locked into place. Ask someone to hold it for you, if possible. Clearly mark and make sure that you can see: Any grab bars or handrails. First and last steps. Where the edge of each step is. Use tools that help you move around (mobility aids) if they are needed. These include: Canes. Walkers. Scooters. Crutches. Turn on the lights when you go into a dark area. Replace any light bulbs as soon as they burn out. Set up your furniture so you have a clear path. Avoid moving your furniture around. If any of your floors are uneven, fix them. If there are any pets around you, be aware of where they are. Review your medicines with your doctor. Some medicines can make you feel dizzy. This can increase your chance of falling. Ask your doctor what other things that you can do to help prevent falls. This information is not intended to replace advice given to you by your health care provider. Make sure you discuss any questions you have with your health care provider. Document Released: 02/16/2009 Document Revised: 09/28/2015 Document Reviewed: 05/27/2014 Elsevier Interactive Patient Education  2017 [employer].

## 2022-07-16 ENCOUNTER — Other Ambulatory Visit: Payer: Medicare HMO

## 2022-07-17 ENCOUNTER — Ambulatory Visit
Admission: EM | Admit: 2022-07-17 | Discharge: 2022-07-17 | Disposition: A | Discharge status: Home / Self Care | Payer: Medicare HMO | Attending: Family Medicine | Admitting: Family Medicine

## 2022-07-17 ENCOUNTER — Ambulatory Visit (INDEPENDENT_AMBULATORY_CARE_PROVIDER_SITE_OTHER): Payer: Medicare HMO

## 2022-07-17 DIAGNOSIS — L84 Corns and callosities: Secondary | ICD-10-CM

## 2022-07-17 DIAGNOSIS — M79672 Pain in left foot: Secondary | ICD-10-CM

## 2022-07-17 DIAGNOSIS — M7989 Other specified soft tissue disorders: Secondary | ICD-10-CM | POA: Diagnosis not present

## 2022-07-17 DIAGNOSIS — M2012 Hallux valgus (acquired), left foot: Secondary | ICD-10-CM | POA: Diagnosis not present

## 2022-07-17 MED ORDER — CEPHALEXIN 500 MG PO CAPS: 500.0000 mg | ORAL_CAPSULE | Freq: Three times a day (TID) | ORAL | 0 refills | Status: AC

## 2022-07-17 NOTE — ED Provider Notes (Signed)
[facility]    CSN: [id number] Arrival date & time: 07/17/22  1734      History   Chief Complaint Chief Complaint  Patient presents with   Toe Problem    HPI  HPI Ebony [name] is a 72 y.o. female.   Ebony [name] presents for left second toe pain. She has a corn on that toe. She wasn't able to go in the office to see her primary care provider so she came into the urgent care.  She has been treating at home with antifungal and topical antibiotics.  Has wound on the dorsum of her foot that isn't healing. She dropped something on her foot.  She has been trying to keep it clean.  Notes that her family has had issues with her feet in the past but she does not want to have any chronic wounds.  She feels like this  has been going on too long.     Past Medical History:  Diagnosis Date   Adjustment disorder with mixed anxiety and depressed mood 10/11/2015   Allergy    Anemia    Arthritis    knees, hands   Borderline diabetes mellitus    Complication of anesthesia    slow to wake   Dry eye syndrome of both eyes    Dysphagia 06/12/2020   GERD (gastroesophageal reflux disease)    Hypercholesterolemia    Loss of weight 12/17/2016   Pain in joint of left knee 12/14/2020   Palpitation 09/29/2019   Prehypertension 12/17/2016   Wears dentures    full upper and lower    Patient Active Problem List   Diagnosis Date Noted   Arthritis of left knee 12/14/2020   Lumbar radiculopathy 12/14/2020   Lumbar spondylosis 12/14/2020   Gastroesophageal reflux disease 05/02/2020   B12 deficiency 12/30/2018   Hyperlipidemia 07/01/2018   Prediabetes 07/01/2018   Neutropenia, unspecified type ([place]) 12/18/2016   Arthritis of knee, degenerative 07/18/2015    Past Surgical History:  Procedure Laterality Date   ABDOMINAL HYSTERECTOMY     complete   COLONOSCOPY WITH PROPOFOL N/A 05/09/2017   Procedure: COLONOSCOPY WITH PROPOFOL;  Surgeon: Lucilla Lame, MD;  Location: [place];   Service: Endoscopy;  Laterality: N/A;   ESOPHAGOGASTRODUODENOSCOPY (EGD) WITH PROPOFOL N/A 05/30/2020   Procedure: ESOPHAGOGASTRODUODENOSCOPY (EGD) WITH PROPOFOL;  Surgeon: Jonathon Bellows, MD;  Location: [facility] ENDOSCOPY;  Service: Gastroenterology;  Laterality: N/A;   ESOPHAGOGASTRODUODENOSCOPY (EGD) WITH PROPOFOL N/A 08/07/2020   Procedure: ESOPHAGOGASTRODUODENOSCOPY (EGD) WITH PROPOFOL;  Surgeon: Jonathon Bellows, MD;  Location: [facility] ENDOSCOPY;  Service: Gastroenterology;  Laterality: N/A;   EYE SURGERY  08/04/2017   KNEE SURGERY Bilateral     OB History   No obstetric history on file.      Home Medications    Prior to Admission medications   Medication Sig Start Date End Date Taking? Authorizing Provider  acetaminophen (TYLENOL) 500 MG tablet Take 1-2 tablets (500-1,000 mg total) by mouth every 8 (eight) hours as needed for moderate pain. OTC 05/02/20  Yes Delsa Grana, PA-C  Calcium Carbonate-Vit D-Min (CALCIUM 1200 PO) Take 600 mg by mouth daily.    Yes [provider]  cephALEXin (KEFLEX) 500 MG capsule Take 1 capsule (500 mg total) by mouth 3 (three) times daily for 7 days. 07/17/22 07/24/22 Yes Tamirra Sienkiewicz, DO  Cholecalciferol (VITAMIN D) 125 MCG (5000 UT) CAPS Take 1 capsule by mouth daily.   Yes [provider]  CINNAMON PO Take by mouth.   Yes  [provider]  Cyanocobalamin (VITAMIN B-12) 1000 MCG SUBL Place 1,000 mcg under the tongue daily.    Yes [provider]  ipratropium (ATROVENT) 0.06 % nasal spray Place 2 sprays into both nostrils 4 (four) times daily. 09/08/20  Yes Margarette Canada, NP  levocetirizine (XYZAL) 5 MG tablet levocetirizine 5 mg tablet   Yes [provider]  lidocaine (LIDODERM) 5 % Place 1 patch onto the skin every 12 (twelve) hours. Remove & Discard patch within 12 hours or as directed by MD 09/25/21 09/25/22 Yes Delsa Grana, PA-C  meloxicam (MOBIC) 7.5 MG tablet Take 7.5 mg by mouth daily. 09/25/21  Yes [provider]  nystatin (MYCOSTATIN) 100000 UNIT/ML suspension USE AS DIRECTED 5 MLS (500,000 UNITS TOTAL) IN THE MOUTH OR THROAT 4 (FOUR) TIMES DAILY. 06/26/20  Yes Jacquelin Hawking, NP  pantoprazole (PROTONIX) 40 MG tablet Take 1 tablet (40 mg total) by mouth daily as needed. For GERD/acid reflux 02/01/22  Yes Delsa Grana, PA-C  pantoprazole (PROTONIX) 40 MG tablet Take 1 tablet (40 mg total) by mouth daily. 02/01/22  Yes Delsa Grana, PA-C  REFRESH CELLUVISC 1 % GEL  07/19/19  Yes [provider]  RESTASIS 0.05 % ophthalmic emulsion Instill 1 drop into both eyes twice a day 03/08/20  Yes [provider]  rosuvastatin (CRESTOR) 5 MG tablet Take 1 tablet (5 mg total) by mouth daily. 02/01/22  Yes Delsa Grana, PA-C  tiZANidine (ZANAFLEX) 4 MG tablet Take 1 tablet (4 mg total) by mouth every 8 (eight) hours as needed for muscle spasms (muscle tightness). 09/13/21  Yes Bo Merino, FNP  TURMERIC PO Take by mouth.   Yes [provider]  TYRVAYA 0.03 MG/ACT SOLN  05/23/21  Yes [provider]    Family History Family History  Problem Relation Age of Onset   Hypertension Mother    Stroke Mother        4 mini strokes   Thyroid disease Mother    Kidney failure Father    Diabetes Father    Hypertension Father    Kidney disease Father    Emphysema Maternal Grandmother    Diabetes Maternal Grandmother    Hypertension Maternal Grandmother    Diabetes Maternal Grandfather    Alcohol abuse Maternal Grandfather    Diabetes Paternal Grandmother    Heart attack Paternal Grandmother    Emphysema Paternal Grandfather    Diabetes Paternal Grandfather    Heart disease Paternal Grandfather    Thyroid disease Sister    Vitamin D deficiency Sister    Arthritis Sister    COPD Sister    Fibromyalgia Sister    Hypertension Sister    Hyperlipidemia Sister    Asthma Sister    Thyroid disease Sister    Breast cancer Other    Cancer Neg Hx     Social History Social History    Tobacco Use   Smoking status: Never   Smokeless tobacco: Never  Vaping Use   Vaping Use: Never used  Substance Use Topics   Alcohol use: No   Drug use: No     Allergies   Celecoxib   Review of Systems Review of Systems: :negative unless otherwise stated in HPI.      Physical Exam Triage Vital Signs ED Triage Vitals  Enc Vitals Group     BP      Pulse      Resp      Temp      Temp  src      SpO2      Weight      Height      Head Circumference      Peak Flow      Pain Score      Pain Loc      Pain Edu?      Excl. in [place]?    No data found.  Updated Vital Signs BP 120/78 (BP Location: Left Arm)   Pulse 73   Temp 98.2 F (36.8 C) (Oral)   Resp 16   Ht 5' 7.5" (1.715 m)   Wt 54.4 kg   SpO2 95%   BMI 18.52 kg/m   Visual Acuity Right Eye Distance:   Left Eye Distance:   Bilateral Distance:    Right Eye Near:   Left Eye Near:    Bilateral Near:     Physical Exam GEN: well appearing female in no acute distress  CVS: well perfused, regular rate, DP pulses strong   RESP: speaking in full sentences without pause, no respiratory distress  MSK:  Left foot:  circular open wound on the medial side of the 2nd toe, proximal 1st and 2nd metatarsal with linear open wound. TTP noted at the 2nd left toe along middle phalanx. + hallus valgus, No visible erythema, swelling, ecchymosis, or bony deformity.  Transverse arch grossly intact; Range of motion is full in all directions. Strength is 5/5 in all directions. No tenderness at the insertion/body/myotendinous junction of the Achilles tendon;  No tenderness on posterior aspects of lateral and medial malleolus; Talar dome nontender; No tenderness over the navicular prominence; No tenderness over cuboid; No pain at base of 5th MT; No tenderness at the distal or proximal metatarsals;  Able to walk 4 steps.     UC Treatments / Results  Labs (all labs ordered are listed, but only abnormal results are displayed) Labs  Reviewed - No data to display  EKG   Radiology DG Toe 2nd Left  Result Date: 07/17/2022 CLINICAL DATA:  Toe wound EXAM: LEFT SECOND TOE COMPARISON:  None Available. FINDINGS: There is soft tissue swelling of the second toe. There is no radiopaque foreign body. The bones are osteopenic. There is no acute fracture or dislocation. No cortical erosions are identified. There are mild degenerative changes of the first metatarsophalangeal joint. IMPRESSION: 1. Soft tissue swelling of the second toe without radiographic evidence of osteomyelitis. 2. Osteopenia. Electronically Signed   By: Ronney Asters M.D.   On: 07/17/2022 18:37    Procedures Procedures (including critical care time)  Medications Ordered in UC Medications - No data to display  Initial Impression / Assessment and Plan / UC Course  I have reviewed the triage vital signs and the nursing notes.  Pertinent labs & imaging results that were available during my care of the patient were reviewed by me and considered in my medical decision making (see chart for details).      Pt is a 72 y.o.  female presents for ongoing wounds on her left foot and toe. Obtained left foot plain films to evaluate for possible osteomyelitis.  Personally reviewed by me were unremarkable for fracture or dislocation. Radiologist notes  soft tissue swelling without evidence of osteomyelitis.  Given antibiotics to cover for possible wound infection. Discussed referral to podiatry and pt is agreeable. Referral placed.   Patient to gradually return to normal activities, as tolerated and continue ordinary activities within the limits permitted by pain. Patient to follow up with  podiatry.  Return and ED precautions given. Understanding voiced. Discussed MDM, treatment plan and plan for follow-up with patient who agrees with plan.   Final Clinical Impressions(s) / UC Diagnoses   Final diagnoses:  Left foot pain  Hallux valgus of left foot  Callus between toes      Discharge Instructions      Your xray did not show any infection of infection of the bone. X-rays did not some thinning of bones in your foot. Stop by the pharmacy to pick up your prescription for skin infection.  A referral was placed to a podiatrist.  You can also call Dr fowler's office for an appointment.       ED Prescriptions     Medication Sig Dispense Auth. Provider   cephALEXin (KEFLEX) 500 MG capsule Take 1 capsule (500 mg total) by mouth 3 (three) times daily for 7 days. 21 capsule Lyndee Hensen, DO      PDMP not reviewed this encounter.   Lyndee Hensen, DO 07/18/22 1129

## 2022-07-17 NOTE — ED Triage Notes (Signed)
Pt c/o L middle toe puncture wound x30 days, states she had a "corn stone" & has been using otc foot meds w/o relief.

## 2022-07-17 NOTE — Discharge Instructions (Addendum)
Your xray did not show any infection of infection of the bone. X-rays did not some thinning of bones in your foot. Stop by the pharmacy to pick up your prescription for skin infection.  A referral was placed to a podiatrist.  You can also call Dr fowler's office for an appointment.

## 2022-07-18 ENCOUNTER — Encounter: Payer: Self-pay | Admitting: Family Medicine

## 2022-07-29 DIAGNOSIS — M25562 Pain in left knee: Secondary | ICD-10-CM | POA: Diagnosis not present

## 2022-07-29 DIAGNOSIS — M170 Bilateral primary osteoarthritis of knee: Secondary | ICD-10-CM | POA: Diagnosis not present

## 2022-07-29 DIAGNOSIS — M25561 Pain in right knee: Secondary | ICD-10-CM | POA: Diagnosis not present

## 2022-08-02 ENCOUNTER — Ambulatory Visit: Payer: Medicare HMO | Admitting: Family Medicine

## 2022-08-12 ENCOUNTER — Encounter: Payer: Self-pay | Admitting: Family Medicine

## 2022-08-12 ENCOUNTER — Ambulatory Visit (INDEPENDENT_AMBULATORY_CARE_PROVIDER_SITE_OTHER): Payer: Medicare HMO | Admitting: Family Medicine

## 2022-08-12 VITALS — BP 108/70 | HR 96 | Temp 98.1°F | Resp 16 | Ht 65.25 in | Wt 120.4 lb

## 2022-08-12 DIAGNOSIS — B3781 Candidal esophagitis: Secondary | ICD-10-CM

## 2022-08-12 DIAGNOSIS — M5416 Radiculopathy, lumbar region: Secondary | ICD-10-CM

## 2022-08-12 DIAGNOSIS — E782 Mixed hyperlipidemia: Secondary | ICD-10-CM

## 2022-08-12 DIAGNOSIS — K219 Gastro-esophageal reflux disease without esophagitis: Secondary | ICD-10-CM | POA: Diagnosis not present

## 2022-08-12 DIAGNOSIS — R7303 Prediabetes: Secondary | ICD-10-CM

## 2022-08-12 DIAGNOSIS — Z1231 Encounter for screening mammogram for malignant neoplasm of breast: Secondary | ICD-10-CM | POA: Diagnosis not present

## 2022-08-12 DIAGNOSIS — J302 Other seasonal allergic rhinitis: Secondary | ICD-10-CM | POA: Diagnosis not present

## 2022-08-12 DIAGNOSIS — M1990 Unspecified osteoarthritis, unspecified site: Secondary | ICD-10-CM | POA: Diagnosis not present

## 2022-08-12 MED ORDER — FLUCONAZOLE 200 MG PO TABS: ORAL_TABLET | ORAL | 0 refills | Status: DC

## 2022-08-12 MED ORDER — LIDOCAINE 5 % EX PTCH: 1.0000 | MEDICATED_PATCH | Freq: Two times a day (BID) | CUTANEOUS | 2 refills | Status: DC

## 2022-08-12 MED ORDER — NYSTATIN 100000 UNIT/ML MT SUSP: 5.0000 mL | Freq: Four times a day (QID) | OROMUCOSAL | 0 refills | Status: DC

## 2022-08-12 MED ORDER — PANTOPRAZOLE SODIUM 40 MG PO TBEC: 40.0000 mg | DELAYED_RELEASE_TABLET | Freq: Every day | ORAL | 1 refills | Status: DC | PRN

## 2022-08-12 MED ORDER — LEVOCETIRIZINE DIHYDROCHLORIDE 5 MG PO TABS: 5.0000 mg | ORAL_TABLET | Freq: Every evening | ORAL | 11 refills | Status: AC

## 2022-08-12 MED ORDER — IPRATROPIUM BROMIDE 0.06 % NA SOLN: 2.0000 | Freq: Four times a day (QID) | NASAL | 12 refills | Status: DC

## 2022-08-12 NOTE — Progress Notes (Unsigned)
Name: Ebony [name]   MRN: [id number]    DOB: 1950/05/31   Date:08/12/2022       Progress Note  Chief Complaint  Patient presents with   Follow-up   Hyperlipidemia   Gastroesophageal Reflux     Subjective:   Ebony [name] is a 72 y.o. female, presents to clinic for routine follow-up and med refills she is also experiencing epigastric discomfort similar to a few years ago when she was diagnosed with eosinophilic esophagitis She reports she has been having scratchy throat, postnasal drip and has been using her nasal spray which she states is Flonase.  Previously Flonase seem to be associated with increased yeast development in her esophagus after EGD was done by Dr. Tobi Bastos She was previously treated with nystatin and oral Diflucan x 14 days On her chart today her medications for allergies and rhinitis are different including Atrovent nose spray and antihistamines and there is no Flonase on her chart  She has also been taking her pantoprazole  Hyperlipidemia managed on Crestor 5 mg Good compliance with no side effects or concerns due for repeat of labs    Current Outpatient Medications:    acetaminophen (TYLENOL) 500 MG tablet, Take 1-2 tablets (500-1,000 mg total) by mouth every 8 (eight) hours as needed for moderate pain. OTC, Disp: 30 tablet, Rfl: 0   Calcium Carbonate-Vit D-Min (CALCIUM 1200 PO), Take 600 mg by mouth daily. , Disp: , Rfl:    Cholecalciferol (VITAMIN D) 125 MCG (5000 UT) CAPS, Take 1 capsule by mouth daily., Disp: , Rfl:    CINNAMON PO, Take by mouth., Disp: , Rfl:    Cyanocobalamin (VITAMIN B-12) 1000 MCG SUBL, Place 1,000 mcg under the tongue daily. , Disp: , Rfl:    fluconazole (DIFLUCAN) 200 MG tablet, Take 400 mg po qd x1 then take 200 mg po qd x 13 days, Disp: 15 tablet, Rfl: 0   meloxicam (MOBIC) 7.5 MG tablet, Take 7.5 mg by mouth daily., Disp: , Rfl:    nystatin (MYCOSTATIN) 100000 UNIT/ML suspension, USE AS DIRECTED 5 MLS (500,000 UNITS TOTAL) IN THE  MOUTH OR THROAT 4 (FOUR) TIMES DAILY., Disp: 120 mL, Rfl: 0   nystatin (MYCOSTATIN) 100000 UNIT/ML suspension, Take 5 mLs (500,000 Units total) by mouth 4 (four) times daily., Disp: 60 mL, Rfl: 0   REFRESH CELLUVISC 1 % GEL, , Disp: , Rfl:    RESTASIS 0.05 % ophthalmic emulsion, Instill 1 drop into both eyes twice a day, Disp: , Rfl:    rosuvastatin (CRESTOR) 5 MG tablet, Take 1 tablet (5 mg total) by mouth daily., Disp: 90 tablet, Rfl: 3   tiZANidine (ZANAFLEX) 4 MG tablet, Take 1 tablet (4 mg total) by mouth every 8 (eight) hours as needed for muscle spasms (muscle tightness)., Disp: 30 tablet, Rfl: 0   TURMERIC PO, Take by mouth., Disp: , Rfl:    TYRVAYA 0.03 MG/ACT SOLN, , Disp: , Rfl:    ipratropium (ATROVENT) 0.06 % nasal spray, Place 2 sprays into both nostrils 4 (four) times daily., Disp: 15 mL, Rfl: 12   levocetirizine (XYZAL) 5 MG tablet, Take 1 tablet (5 mg total) by mouth every evening., Disp: 30 tablet, Rfl: 11   lidocaine (LIDODERM) 5 %, Place 1 patch onto the skin every 12 (twelve) hours. Remove & Discard patch within 12 hours or as directed by MD, Disp: 10 patch, Rfl: 2   pantoprazole (PROTONIX) 40 MG tablet, Take 1 tablet (40 mg total) by mouth daily as  needed. For GERD/acid reflux, Disp: 30 tablet, Rfl: 1  Patient Active Problem List   Diagnosis Date Noted   Arthritis of left knee 12/14/2020   Lumbar radiculopathy 12/14/2020   Lumbar spondylosis 12/14/2020   Gastroesophageal reflux disease 05/02/2020   B12 deficiency 12/30/2018   Hyperlipidemia 07/01/2018   Prediabetes 07/01/2018   Neutropenia, unspecified type 12/18/2016   Arthritis of knee, degenerative 07/18/2015    Past Surgical History:  Procedure Laterality Date   ABDOMINAL HYSTERECTOMY     complete   COLONOSCOPY WITH PROPOFOL N/A 05/09/2017   Procedure: COLONOSCOPY WITH PROPOFOL;  Surgeon: Midge Minium, MD;  Location[facility];  Service: Endoscopy;  Laterality: N/A;   ESOPHAGOGASTRODUODENOSCOPY (EGD)  WITH PROPOFOL N/A 05/30/2020   Procedure: ESOPHAGOGASTRODUODENOSCOPY (EGD) WITH PROPOFOL;  Surgeon: Wyline Mood, MD;  Location: [facility] ENDOSCOPY;  Service: Gastroenterology;  Laterality: N/A;   ESOPHAGOGASTRODUODENOSCOPY (EGD) WITH PROPOFOL N/A 08/07/2020   Procedure: ESOPHAGOGASTRODUODENOSCOPY (EGD) WITH PROPOFOL;  Surgeon: Wyline Mood, MD;  Location[facility] ENDOSCOPY;  Service: Gastroenterology;  Laterality: N/A;   EYE SURGERY  08/04/2017   KNEE SURGERY Bilateral     Family History  Problem Relation Age of Onset   Hypertension Mother    Stroke Mother        4 mini strokes   Thyroid disease Mother    Kidney failure Father    Diabetes Father    Hypertension Father    Kidney disease Father    Emphysema Maternal Grandmother    Diabetes Maternal Grandmother    Hypertension Maternal Grandmother    Diabetes Maternal Grandfather    Alcohol abuse Maternal Grandfather    Diabetes Paternal Grandmother    Heart attack Paternal Grandmother    Emphysema Paternal Grandfather    Diabetes Paternal Grandfather    Heart disease Paternal Grandfather    Thyroid disease Sister    Vitamin D deficiency Sister    Arthritis Sister    COPD Sister    Fibromyalgia Sister    Hypertension Sister    Hyperlipidemia Sister    Asthma Sister    Thyroid disease Sister    Breast cancer Other    Cancer Neg Hx     Social History   Tobacco Use   Smoking status: Never   Smokeless tobacco: Never  Vaping Use   Vaping Use: Never used  Substance Use Topics   Alcohol use: No   Drug use: No     Allergies  Allergen Reactions   Celecoxib Anxiety, Itching and Other (See Comments)    Health Maintenance  Topic Date Due   DTaP/Tdap/Td (1 - Tdap) Never done   MAMMOGRAM  02/22/2021   DEXA SCAN  02/22/2022   COVID-19 Vaccine (5 - 2023-24 season) 08/28/2022 (Originally 05/02/2022)   Pneumonia Vaccine 79+ Years old (1 of 1 - PCV) 09/26/2022 (Originally 09/19/2014)   Zoster Vaccines- Shingrix (1 of 2) 11/11/2022  (Originally 09/19/1999)   INFLUENZA VACCINE  12/05/2022   Medicare Annual Wellness (AWV)  06/29/2023   COLONOSCOPY (Pts 45-41yrs Insurance coverage will need to be confirmed)  05/10/2027   Hepatitis C Screening  Completed   HPV VACCINES  Aged Out    Chart Review Today: I personally reviewed active problem list, medication list, allergies, family history, social history, health maintenance, notes from last encounter, lab results, imaging with the patient/caregiver today.   Review of Systems  Constitutional: Negative.   HENT: Negative.    Eyes: Negative.   Respiratory: Negative.    Cardiovascular: Negative.   Gastrointestinal: Negative.  Endocrine: Negative.   Genitourinary: Negative.   Musculoskeletal: Negative.   Skin: Negative.   Allergic/Immunologic: Negative.   Neurological: Negative.   Hematological: Negative.   Psychiatric/Behavioral: Negative.    All other systems reviewed and are negative.    Objective:   Vitals:   08/12/22 1522  BP: 108/70  Pulse: 96  Resp: 16  Temp: 98.1 F (36.7 C)  TempSrc: Oral  SpO2: 98%  Weight: 120 lb 6.4 oz (54.6 kg)  Height: 5' 5.25" (1.657 m)    Body mass index is 19.88 kg/m.  Physical Exam Vitals and nursing note reviewed.  Constitutional:      General: She is not in acute distress.    Appearance: Normal appearance. She is well-developed and normal weight. She is not ill-appearing, toxic-appearing or diaphoretic.  HENT:     Head: Normocephalic and atraumatic.     Right Ear: External ear normal.     Left Ear: External ear normal.     Nose: Nose normal.  Eyes:     General: No scleral icterus.       Right eye: No discharge.        Left eye: No discharge.     Conjunctiva/sclera: Conjunctivae normal.  Neck:     Trachea: No tracheal deviation.  Cardiovascular:     Rate and Rhythm: Normal rate and regular rhythm.     Pulses: Normal pulses.     Heart sounds: Normal heart sounds. No murmur heard.    No friction rub. No  gallop.  Pulmonary:     Effort: Pulmonary effort is normal. No respiratory distress.     Breath sounds: Normal breath sounds. No stridor. No wheezing, rhonchi or rales.  Abdominal:     General: Bowel sounds are normal. There is no distension.     Palpations: Abdomen is soft.     Tenderness: There is no abdominal tenderness. There is no guarding or rebound.  Musculoskeletal:     Right lower leg: No edema.     Left lower leg: No edema.  Skin:    General: Skin is warm and dry.     Findings: No rash.  Neurological:     Mental Status: She is alert. Mental status is at baseline.     Motor: No abnormal muscle tone.     Coordination: Coordination normal.  Psychiatric:        Mood and Affect: Mood normal.        Behavior: Behavior normal.         Assessment & Plan:   Problem List Items Addressed This Visit       Respiratory   Seasonal allergic rhinitis    Continue to avoid intranasal steroid sprays she can use Atrovent, antihistamine nasal sprays or saline nasal sprays and over-the-counter antihistamine pills Symptoms have been worse with recent season and weather changes and pollen she has an postnasal drip and scratchy throat      Relevant Medications   ipratropium (ATROVENT) 0.06 % nasal spray   levocetirizine (XYZAL) 5 MG tablet     Digestive   Gastroesophageal reflux disease    Recurrent and worsening symptoms similar to when she was previously diagnosed with esophageal Candida She reports using Flonase which was previously associated with symptoms in her diagnosis she was instructed to stop Flonase and throat all the way at home, we will repeat treatment for esophageal Candida with Diflucan and oral nystatin she was strongly encouraged to follow-up with GI if her symptoms do not improve  she can continue to take Protonix and encouraged to work on diet lifestyle efforts      Relevant Medications   fluconazole (DIFLUCAN) 200 MG tablet   nystatin (MYCOSTATIN) 100000 UNIT/ML  suspension   pantoprazole (PROTONIX) 40 MG tablet   Candida infection, esophageal    Hx of in 2022 - sx today very similar Stop flonase Tx with meds below - f/up GI if sx not improved      Relevant Medications   fluconazole (DIFLUCAN) 200 MG tablet   nystatin (MYCOSTATIN) 100000 UNIT/ML suspension     Nervous and Auditory   Lumbar radiculopathy    She request refills of lidocaine patches      Relevant Medications   lidocaine (LIDODERM) 5 %     Other   Hyperlipidemia - Primary (Chronic)    Cholesterol is previously managed on rosuvastatin 5 mg daily she endorses fairly good compliance without any myalgias side effects or concerns at this time she is due for labs      Relevant Orders   COMPLETE METABOLIC PANEL WITH GFR (Completed)   Lipid panel (Completed)   Prediabetes (Chronic)    Recheck A1c, not currently working on any particular diet efforts      Relevant Orders   COMPLETE METABOLIC PANEL WITH GFR (Completed)   Hemoglobin A1c (Completed)   Other Visit Diagnoses     Encounter for screening mammogram for malignant neoplasm of breast       Relevant Orders   MM 3D SCREENING MAMMOGRAM BILATERAL BREAST   Arthritis       Relevant Medications   lidocaine (LIDODERM) 5 %        Return for 1 month virtual or OV for GERD f/up .   Danelle Berry, PA-C 08/12/22 3:58 PM

## 2022-08-13 ENCOUNTER — Encounter: Payer: Self-pay | Admitting: Family Medicine

## 2022-08-13 DIAGNOSIS — J302 Other seasonal allergic rhinitis: Secondary | ICD-10-CM | POA: Insufficient documentation

## 2022-08-13 DIAGNOSIS — B3781 Candidal esophagitis: Secondary | ICD-10-CM | POA: Insufficient documentation

## 2022-08-13 LAB — HEMOGLOBIN A1C
Hgb A1c MFr Bld: 6.3 % of total Hgb — ABNORMAL HIGH (ref <5.7)
Mean Plasma Glucose: 134 mg/dL
eAG (mmol/L): 7.4 mmol/L

## 2022-08-13 LAB — COMPLETE METABOLIC PANEL WITH GFR
AG Ratio: 1.6 (calc) (ref 1.0–2.5)
ALT: 12 U/L (ref 6–29)
AST: 15 U/L (ref 10–35)
Albumin: 3.8 g/dL (ref 3.6–5.1)
Alkaline phosphatase (APISO): 65 U/L (ref 37–153)
BUN/Creatinine Ratio: 22 (calc) (ref 6–22)
BUN: 11 mg/dL (ref 7–25)
CO2: 28 mmol/L (ref 20–32)
Calcium: 8.8 mg/dL (ref 8.6–10.4)
Chloride: 106 mmol/L (ref 98–110)
Creat: 0.51 mg/dL — ABNORMAL LOW (ref 0.60–1.00)
Globulin: 2.4 g/dL (calc) (ref 1.9–3.7)
Glucose, Bld: 115 mg/dL — ABNORMAL HIGH (ref 65–99)
Potassium: 4 mmol/L (ref 3.5–5.3)
Sodium: 140 mmol/L (ref 135–146)
Total Bilirubin: 0.5 mg/dL (ref 0.2–1.2)
Total Protein: 6.2 g/dL (ref 6.1–8.1)
eGFR: 99 mL/min/{1.73_m2} (ref >=60)

## 2022-08-13 LAB — LIPID PANEL
Cholesterol: 268 mg/dL — ABNORMAL HIGH (ref <200)
HDL: 82 mg/dL (ref >=50)
LDL Cholesterol (Calc): 168 mg/dL (calc) — ABNORMAL HIGH
Non-HDL Cholesterol (Calc): 186 mg/dL (calc) — ABNORMAL HIGH (ref <130)
Total CHOL/HDL Ratio: 3.3 (calc) (ref <5.0)
Triglycerides: 79 mg/dL (ref <150)

## 2022-08-13 NOTE — Assessment & Plan Note (Signed)
Continue to avoid intranasal steroid sprays she can use Atrovent, antihistamine nasal sprays or saline nasal sprays and over-the-counter antihistamine pills Symptoms have been worse with recent season and weather changes and pollen she has an postnasal drip and scratchy throat

## 2022-08-13 NOTE — Assessment & Plan Note (Signed)
Recurrent and worsening symptoms similar to when she was previously diagnosed with esophageal Candida She reports using Flonase which was previously associated with symptoms in her diagnosis she was instructed to stop Flonase and throat all the way at home, we will repeat treatment for esophageal Candida with Diflucan and oral nystatin she was strongly encouraged to follow-up with GI if her symptoms do not improve she can continue to take Protonix and encouraged to work on diet lifestyle efforts

## 2022-08-13 NOTE — Assessment & Plan Note (Signed)
Recheck A1c, not currently working on any particular diet efforts

## 2022-08-13 NOTE — Assessment & Plan Note (Signed)
Cholesterol is previously managed on rosuvastatin 5 mg daily she endorses fairly good compliance without any myalgias side effects or concerns at this time she is due for labs

## 2022-08-13 NOTE — Assessment & Plan Note (Signed)
She request refills of lidocaine patches

## 2022-08-13 NOTE — Assessment & Plan Note (Signed)
Hx of in 2022 - sx today very similar Stop flonase Tx with meds below - f/up GI if sx not improved

## 2022-08-16 ENCOUNTER — Telehealth: Payer: Self-pay | Admitting: Family Medicine

## 2022-08-16 ENCOUNTER — Other Ambulatory Visit: Payer: Self-pay

## 2022-08-16 DIAGNOSIS — M1990 Unspecified osteoarthritis, unspecified site: Secondary | ICD-10-CM

## 2022-08-16 DIAGNOSIS — M5416 Radiculopathy, lumbar region: Secondary | ICD-10-CM

## 2022-08-16 MED ORDER — LIDOCAINE 5 % EX PTCH: 1.0000 | MEDICATED_PATCH | Freq: Two times a day (BID) | CUTANEOUS | 2 refills | Status: DC

## 2022-08-16 NOTE — Telephone Encounter (Signed)
Copied from CRM [id number]. Topic: General - Other >> Aug 16, 2022 11:30 AM Everette C wrote: Reason for CRM: The patient has been directed by their pharmacy to contact their PCP and request a prior authorization for their lidocaine (LIDODERM) 5 % [[id number]]  Please contact the patient further if needed

## 2022-08-16 NOTE — Telephone Encounter (Signed)
Called pt back and spoke to Ebony [name] her spouse stating the PA has not been done since it was sent today and waiting from pharmacy faxed form to be sent over pt spouse verbalized understanding.

## 2022-08-16 NOTE — Telephone Encounter (Signed)
Pharmacy requested clarification on quantity. #1 box of 30 patches.

## 2022-08-20 ENCOUNTER — Other Ambulatory Visit: Payer: Self-pay | Admitting: Family Medicine

## 2022-08-20 DIAGNOSIS — E782 Mixed hyperlipidemia: Secondary | ICD-10-CM

## 2022-08-20 MED ORDER — ROSUVASTATIN CALCIUM 10 MG PO TABS: 10.0000 mg | ORAL_TABLET | Freq: Every day | ORAL | 1 refills | Status: DC

## 2022-08-24 DIAGNOSIS — S8002XA Contusion of left knee, initial encounter: Secondary | ICD-10-CM | POA: Diagnosis not present

## 2022-08-27 ENCOUNTER — Telehealth: Payer: Self-pay | Admitting: Family Medicine

## 2022-08-27 DIAGNOSIS — M5416 Radiculopathy, lumbar region: Secondary | ICD-10-CM

## 2022-08-27 MED ORDER — LIDOCAINE 5 % EX PTCH: 1.0000 | MEDICATED_PATCH | CUTANEOUS | 2 refills | Status: AC

## 2022-08-27 NOTE — Telephone Encounter (Signed)
[facility] is calling because they need clarification on dosage instructions for lidocaine (LIDODERM) 5 % [[id number]]. Pharmacy says the dosage is usually apply 1 every 24 hours and remove within 12 hours, but the instructions on this prescription say place one on the skin every 12 hours. Please advise.

## 2022-08-30 DIAGNOSIS — M1712 Unilateral primary osteoarthritis, left knee: Secondary | ICD-10-CM | POA: Diagnosis not present

## 2022-08-30 DIAGNOSIS — S93402A Sprain of unspecified ligament of left ankle, initial encounter: Secondary | ICD-10-CM | POA: Diagnosis not present

## 2022-09-05 ENCOUNTER — Ambulatory Visit: Payer: Medicare HMO | Admitting: Family Medicine

## 2022-09-05 ENCOUNTER — Encounter: Payer: Self-pay | Admitting: Family Medicine

## 2022-09-05 ENCOUNTER — Ambulatory Visit: Payer: Self-pay | Admitting: *Deleted

## 2022-09-05 VITALS — BP 104/68 | HR 83 | Temp 97.8°F | Resp 16 | Ht 65.25 in | Wt 120.4 lb

## 2022-09-05 DIAGNOSIS — M79672 Pain in left foot: Secondary | ICD-10-CM | POA: Diagnosis not present

## 2022-09-05 DIAGNOSIS — S93402D Sprain of unspecified ligament of left ankle, subsequent encounter: Secondary | ICD-10-CM

## 2022-09-05 DIAGNOSIS — M5432 Sciatica, left side: Secondary | ICD-10-CM | POA: Diagnosis not present

## 2022-09-05 DIAGNOSIS — M1712 Unilateral primary osteoarthritis, left knee: Secondary | ICD-10-CM

## 2022-09-05 MED ORDER — PREDNISONE 20 MG PO TABS: ORAL_TABLET | ORAL | 0 refills | Status: DC

## 2022-09-05 MED ORDER — TRAMADOL HCL 50 MG PO TABS: 50.0000 mg | ORAL_TABLET | Freq: Two times a day (BID) | ORAL | 0 refills | Status: DC | PRN

## 2022-09-05 NOTE — Progress Notes (Signed)
Patient ID: Ebony [name], female    DOB: Sep 24, 1950, 72 y.o.   MRN: [id number]  PCP: Danelle Berry, PA-C  Chief Complaint  Patient presents with   Leg Pain    Left leg, pt fell fracture part of her foot has been seen by [facility]    Subjective:   Ebony [name] is a 72 y.o. female, presents to clinic with CC of the following:  HPI  Pt with fall about 12 days ago at the time there was an ems eval -she decided not to go to the hospital, and she later went to [facility] x 2 Initial eval showed left ankle sprain and severe left knee arthritis and she went back for check up when pain was more severe a few days later (no encounter visible) and at the second visit she was diagnosed with a hairline fracture somewhere in her left foot.  She reports she was told she had severe arthritis in multiple joints, her left knee was swollen and she was given a injection in her knee which has not improved her pain.  Since using the boot she has tried to use a walker but it has caused severe pain to shoot up and down her left leg coming from her left low back and buttocks area she does have a history of low back pain with out sciatica She has tried Tylenol over-the-counter and this has not helped with pain She is stopped wearing the boot She is here today to be checked because of her severe discomfort Most bothersome pain is that shooting pain that feels like it is going up and down the left leg.  Her foot pain is minimal.  She does have some left ankle and left knee pain as well  Patient Active Problem List   Diagnosis Date Noted   Seasonal allergic rhinitis 08/13/2022   Candida infection, esophageal (HCC) 08/13/2022   Arthritis of left knee 12/14/2020   Lumbar radiculopathy 12/14/2020   Lumbar spondylosis 12/14/2020   Gastroesophageal reflux disease 05/02/2020   B12 deficiency 12/30/2018   Hyperlipidemia 07/01/2018   Prediabetes 07/01/2018   Neutropenia, unspecified type (HCC) 12/18/2016    Arthritis of knee, degenerative 07/18/2015      Current Outpatient Medications:    acetaminophen (TYLENOL) 500 MG tablet, Take 1-2 tablets (500-1,000 mg total) by mouth every 8 (eight) hours as needed for moderate pain. OTC, Disp: 30 tablet, Rfl: 0   Calcium Carbonate-Vit D-Min (CALCIUM 1200 PO), Take 600 mg by mouth daily. , Disp: , Rfl:    Cholecalciferol (VITAMIN D) 125 MCG (5000 UT) CAPS, Take 1 capsule by mouth daily., Disp: , Rfl:    CINNAMON PO, Take by mouth., Disp: , Rfl:    Cyanocobalamin (VITAMIN B-12) 1000 MCG SUBL, Place 1,000 mcg under the tongue daily. , Disp: , Rfl:    fluconazole (DIFLUCAN) 200 MG tablet, Take 400 mg po qd x1 then take 200 mg po qd x 13 days, Disp: 15 tablet, Rfl: 0   ipratropium (ATROVENT) 0.06 % nasal spray, Place 2 sprays into both nostrils 4 (four) times daily., Disp: 15 mL, Rfl: 12   levocetirizine (XYZAL) 5 MG tablet, Take 1 tablet (5 mg total) by mouth every evening., Disp: 30 tablet, Rfl: 11   lidocaine (LIDODERM) 5 %, Place 1 patch onto the skin daily. As needed for msk pain, Disp: 30 patch, Rfl: 2   meloxicam (MOBIC) 7.5 MG tablet, Take 7.5 mg by mouth daily., Disp: , Rfl:  nystatin (MYCOSTATIN) 100000 UNIT/ML suspension, USE AS DIRECTED 5 MLS (500,000 UNITS TOTAL) IN THE MOUTH OR THROAT 4 (FOUR) TIMES DAILY., Disp: 120 mL, Rfl: 0   nystatin (MYCOSTATIN) 100000 UNIT/ML suspension, Take 5 mLs (500,000 Units total) by mouth 4 (four) times daily., Disp: 60 mL, Rfl: 0   pantoprazole (PROTONIX) 40 MG tablet, Take 1 tablet (40 mg total) by mouth daily as needed. For GERD/acid reflux, Disp: 30 tablet, Rfl: 1   REFRESH CELLUVISC 1 % GEL, , Disp: , Rfl:    RESTASIS 0.05 % ophthalmic emulsion, Instill 1 drop into both eyes twice a day, Disp: , Rfl:    rosuvastatin (CRESTOR) 10 MG tablet, Take 1 tablet (10 mg total) by mouth daily., Disp: 90 tablet, Rfl: 1   tiZANidine (ZANAFLEX) 4 MG tablet, Take 1 tablet (4 mg total) by mouth every 8 (eight) hours as needed  for muscle spasms (muscle tightness)., Disp: 30 tablet, Rfl: 0   TURMERIC PO, Take by mouth., Disp: , Rfl:    TYRVAYA 0.03 MG/ACT SOLN, , Disp: , Rfl:    Allergies  Allergen Reactions   Celecoxib Anxiety, Itching and Other (See Comments)     Social History   Tobacco Use   Smoking status: Never   Smokeless tobacco: Never  Vaping Use   Vaping Use: Never used  Substance Use Topics   Alcohol use: No   Drug use: No      Chart Review Today: I personally reviewed active problem list, medication list, allergies, family history, social history, health maintenance, notes from last encounter, lab results, imaging with the patient/caregiver today.   Review of Systems  Constitutional: Negative.   HENT: Negative.    Eyes: Negative.   Respiratory: Negative.    Cardiovascular: Negative.   Gastrointestinal: Negative.   Endocrine: Negative.   Genitourinary: Negative.   Musculoskeletal: Negative.   Skin: Negative.   Allergic/Immunologic: Negative.   Neurological: Negative.   Hematological: Negative.   Psychiatric/Behavioral: Negative.    All other systems reviewed and are negative.      Objective:   Vitals:   09/05/22 1120  BP: 104/68  Pulse: 83  Resp: 16  Temp: 97.8 F (36.6 C)  TempSrc: Oral  SpO2: 98%  Weight: 120 lb 6.4 oz (54.6 kg)  Height: 5' 5.25" (1.657 m)    Body mass index is 19.88 kg/m.  Physical Exam Vitals and nursing note reviewed.  Constitutional:      Appearance: She is well-developed.  HENT:     Head: Normocephalic and atraumatic.     Nose: Nose normal.  Eyes:     General:        Right eye: No discharge.        Left eye: No discharge.     Conjunctiva/sclera: Conjunctivae normal.  Neck:     Trachea: No tracheal deviation.  Cardiovascular:     Rate and Rhythm: Normal rate and regular rhythm.  Pulmonary:     Effort: Pulmonary effort is normal. No respiratory distress.     Breath sounds: No stridor.  Musculoskeletal:     Comments: Ttp to  lumbar paraspinal and left SI joint area w/o midline tenderness Left knee swelling in compression sleeve  Skin:    General: Skin is warm and dry.     Findings: No rash.  Neurological:     Mental Status: She is alert.     Motor: No abnormal muscle tone.     Coordination: Coordination normal.     Gait: Gait  abnormal.     Comments: In [facility] for extent of visit  Psychiatric:        Behavior: Behavior normal.      Results for orders placed or performed in visit on 08/12/22  COMPLETE METABOLIC PANEL WITH GFR  Result Value Ref Range   Glucose, Bld 115 (H) 65 - 99 mg/dL   BUN 11 7 - 25 mg/dL   Creat 5.28 (L) 4.13 - 1.00 mg/dL   eGFR 99 > OR = 60 KG/MWN/0.27O5   BUN/Creatinine Ratio 22 6 - 22 (calc)   Sodium 140 135 - 146 mmol/L   Potassium 4.0 3.5 - 5.3 mmol/L   Chloride 106 98 - 110 mmol/L   CO2 28 20 - 32 mmol/L   Calcium 8.8 8.6 - 10.4 mg/dL   Total Protein 6.2 6.1 - 8.1 g/dL   Albumin 3.8 3.6 - 5.1 g/dL   Globulin 2.4 1.9 - 3.7 g/dL (calc)   AG Ratio 1.6 1.0 - 2.5 (calc)   Total Bilirubin 0.5 0.2 - 1.2 mg/dL   Alkaline phosphatase (APISO) 65 37 - 153 U/L   AST 15 10 - 35 U/L   ALT 12 6 - 29 U/L  Lipid panel  Result Value Ref Range   Cholesterol 268 (H) <200 mg/dL   HDL 82 > OR = 50 mg/dL   Triglycerides 79 <366 mg/dL   LDL Cholesterol (Calc) 168 (H) mg/dL (calc)   Total CHOL/HDL Ratio 3.3 <5.0 (calc)   Non-HDL Cholesterol (Calc) 186 (H) <130 mg/dL (calc)  Hemoglobin Y4I  Result Value Ref Range   Hgb A1c MFr Bld 6.3 (H) <5.7 % of total Hgb   Mean Plasma Glucose 134 mg/dL   eAG (mmol/L) 7.4 mmol/L       Assessment & Plan:   1. Sciatica of left side Prednisone burst may help with radicular sx/sciatica  2. Left foot pain Per ortho -encouraged her to follow-up with them about her boot and other management did encourage her to avoid ambulation and follow all Ortho instructions Did highly recommend using her walker to help avoid strain on other joints  3. Arthritis of  left knee Severe osteoarthritis recent injection by Ortho requesting records  4. Sprain of left ankle, unspecified ligament, subsequent encounter Per Ortho  Encourage patient to do over-the-counter medications alternating and taking scheduled for short period of time Doing Tylenol and NSAID, resting and elevating per Ortho instructions Given a few days worth of tramadol for severe pain, no refills, explained controlled substance prescribing laws for acute pain complaints   F/up with Ortho  Danelle Berry, PA-C 09/05/22 11:32 AM

## 2022-09-05 NOTE — Telephone Encounter (Signed)
  Chief Complaint: left leg pain Symptoms: Patient had injury to foot 4/20 and has been wearing boot for support. Patient is having severe pain in the back of that leg Frequency: 4/20  Disposition: [] ED /[] Urgent Care (no appt availability in office) / [x] Appointment(In office/virtual)/ []  [place] [facility]/ [] Home Care/ [] Refused Recommended Disposition /[] [place] [facility]/ []  Follow-up with PCP Additional Notes: Pain caused by boot she is wearing for broken toes. Appointment made to assess leg pain

## 2022-09-05 NOTE — Telephone Encounter (Signed)
Reason for Disposition  [1] SEVERE pain (e.g., excruciating, unable to do any normal activities) AND [2] not improved after 2 hours of pain medicine  Answer Assessment - Initial Assessment Questions 1. ONSET: "When did the pain start?"      Patient is wearing boot for broken toes- boot is rubbing on shin 2. LOCATION: "Where is the pain located?"      Left leg 3. PAIN: "How bad is the pain?"    (Scale 1-10; or mild, moderate, severe)   -  MILD (1-3): doesn't interfere with normal activities    -  MODERATE (4-7): interferes with normal activities (e.g., work or school) or awakens from sleep, limping    -  SEVERE (8-10): excruciating pain, unable to do any normal activities, unable to walk     severe 4. WORK OR EXERCISE: "Has there been any recent work or exercise that involved this part of the body?"      Recent injury 4/20 5. CAUSE: "What do you think is causing the leg pain?"     Boot on leg/foot  Protocols used: Leg Pain-A-AH

## 2022-09-05 NOTE — Patient Instructions (Addendum)
Tylenol dosing [phone] mg every 6 hours with maximum dose in 24 hours under 4000 mg   Pick ibuprofen, aleve or naproxen as directed on bottle or box - but for the next 1-2 weeks you would try to take these and the tylenol regularly to get you through some of this acute pain  Steroids may help the shooting nerve/leg pain  I will send in a few tramadol pain medications to help for only severe pain - for example keeping you up at night

## 2022-09-09 DIAGNOSIS — H04123 Dry eye syndrome of bilateral lacrimal glands: Secondary | ICD-10-CM | POA: Diagnosis not present

## 2022-09-09 DIAGNOSIS — H2513 Age-related nuclear cataract, bilateral: Secondary | ICD-10-CM | POA: Diagnosis not present

## 2022-09-10 ENCOUNTER — Telehealth (INDEPENDENT_AMBULATORY_CARE_PROVIDER_SITE_OTHER): Payer: Medicare HMO | Admitting: Family Medicine

## 2022-09-10 DIAGNOSIS — R634 Abnormal weight loss: Secondary | ICD-10-CM | POA: Diagnosis not present

## 2022-09-10 DIAGNOSIS — R1310 Dysphagia, unspecified: Secondary | ICD-10-CM

## 2022-09-10 DIAGNOSIS — B3781 Candidal esophagitis: Secondary | ICD-10-CM | POA: Diagnosis not present

## 2022-09-10 DIAGNOSIS — K219 Gastro-esophageal reflux disease without esophagitis: Secondary | ICD-10-CM | POA: Diagnosis not present

## 2022-09-10 NOTE — Progress Notes (Signed)
Name: Ebony [name]   MRN: [id number]    DOB: June 28, 1950   Date:09/10/2022       Progress Note  Subjective:    Chief Complaint  Chief Complaint  Patient presents with   Follow-up   Gastroesophageal Reflux    Still the same    I connected with  Ebony [name]  on 09/10/22 at  3:00 PM EDT by a video enabled telemedicine application and verified that I am speaking with the correct person using two identifiers.  I discussed the limitations of evaluation and management by telemedicine and the availability of in person appointments. The patient expressed understanding and agreed to proceed. Staff also discussed with the patient that there may be a patient responsible charge related to this service. Patient Location: home Provider Location: [facility] clinic Additional Individuals present: none  Gastroesophageal Reflux   GERD sx are not better after treating again for possible recurrent candida infection, esophageal GI upset, dysphagia  Lost a little more weight per her scale at home Stopped steroid nose spray and that helped sx a little  Food getting stuck in back of throat, can't get it down, sometimes choking regurgitating Food taste awful Sx worse at night    Patient Active Problem List   Diagnosis Date Noted   Seasonal allergic rhinitis 08/13/2022   Candida infection, esophageal (HCC) 08/13/2022   Arthritis of left knee 12/14/2020   Lumbar radiculopathy 12/14/2020   Lumbar spondylosis 12/14/2020   Gastroesophageal reflux disease 05/02/2020   B12 deficiency 12/30/2018   Hyperlipidemia 07/01/2018   Prediabetes 07/01/2018   Neutropenia, unspecified type (HCC) 12/18/2016   Arthritis of knee, degenerative 07/18/2015    Social History   Tobacco Use   Smoking status: Never   Smokeless tobacco: Never  Substance Use Topics   Alcohol use: No     Current Outpatient Medications:    acetaminophen (TYLENOL) 500 MG tablet, Take 1-2 tablets (500-1,000 mg total) by mouth every 8  (eight) hours as needed for moderate pain. OTC, Disp: 30 tablet, Rfl: 0   Calcium Carbonate-Vit D-Min (CALCIUM 1200 PO), Take 600 mg by mouth daily. , Disp: , Rfl:    Cholecalciferol (VITAMIN D) 125 MCG (5000 UT) CAPS, Take 1 capsule by mouth daily., Disp: , Rfl:    CINNAMON PO, Take by mouth., Disp: , Rfl:    Cyanocobalamin (VITAMIN B-12) 1000 MCG SUBL, Place 1,000 mcg under the tongue daily. , Disp: , Rfl:    fluconazole (DIFLUCAN) 200 MG tablet, Take 400 mg po qd x1 then take 200 mg po qd x 13 days, Disp: 15 tablet, Rfl: 0   ipratropium (ATROVENT) 0.06 % nasal spray, Place 2 sprays into both nostrils 4 (four) times daily., Disp: 15 mL, Rfl: 12   levocetirizine (XYZAL) 5 MG tablet, Take 1 tablet (5 mg total) by mouth every evening., Disp: 30 tablet, Rfl: 11   lidocaine (LIDODERM) 5 %, Place 1 patch onto the skin daily. As needed for msk pain, Disp: 30 patch, Rfl: 2   meloxicam (MOBIC) 7.5 MG tablet, Take 7.5 mg by mouth daily., Disp: , Rfl:    nystatin (MYCOSTATIN) 100000 UNIT/ML suspension, USE AS DIRECTED 5 MLS (500,000 UNITS TOTAL) IN THE MOUTH OR THROAT 4 (FOUR) TIMES DAILY., Disp: 120 mL, Rfl: 0   nystatin (MYCOSTATIN) 100000 UNIT/ML suspension, Take 5 mLs (500,000 Units total) by mouth 4 (four) times daily., Disp: 60 mL, Rfl: 0   pantoprazole (PROTONIX) 40 MG tablet, Take 1 tablet (40 mg total) by  mouth daily as needed. For GERD/acid reflux, Disp: 30 tablet, Rfl: 1   predniSONE (DELTASONE) 20 MG tablet, 2 tabs poqday 1-3, 1 tabs poqday 4-6, Disp: 9 tablet, Rfl: 0   REFRESH CELLUVISC 1 % GEL, , Disp: , Rfl:    RESTASIS 0.05 % ophthalmic emulsion, Instill 1 drop into both eyes twice a day, Disp: , Rfl:    rosuvastatin (CRESTOR) 10 MG tablet, Take 1 tablet (10 mg total) by mouth daily., Disp: 90 tablet, Rfl: 1   tiZANidine (ZANAFLEX) 4 MG tablet, Take 1 tablet (4 mg total) by mouth every 8 (eight) hours as needed for muscle spasms (muscle tightness)., Disp: 30 tablet, Rfl: 0   traMADol  (ULTRAM) 50 MG tablet, Take 1 tablet (50 mg total) by mouth every 12 (twelve) hours as needed., Disp: 10 tablet, Rfl: 0   TURMERIC PO, Take by mouth., Disp: , Rfl:    TYRVAYA 0.03 MG/ACT SOLN, , Disp: , Rfl:   Allergies  Allergen Reactions   Celecoxib Anxiety, Itching and Other (See Comments)    I personally reviewed active problem list, medication list, allergies, family history, social history, health maintenance, notes from last encounter, lab results, imaging with the patient/caregiver today.   Review of Systems  Constitutional: Negative.   HENT: Negative.    Eyes: Negative.   Respiratory: Negative.    Cardiovascular: Negative.   Gastrointestinal: Negative.   Endocrine: Negative.   Genitourinary: Negative.   Musculoskeletal: Negative.   Skin: Negative.   Allergic/Immunologic: Negative.   Neurological: Negative.   Hematological: Negative.   Psychiatric/Behavioral: Negative.    All other systems reviewed and are negative.     Objective:   Virtual encounter, vitals limited, only able to obtain the following There were no vitals filed for this visit. There is no height or weight on file to calculate BMI. Nursing Note and Vital Signs reviewed.  Physical Exam Alert, phonation clear, no acute distress, speaking in full and complete sentences, pleasant/good mood PE limited by virtual encounter  No results found for this or any previous visit (from the past 72 hour(s)).  Assessment and Plan:     ICD-10-CM   1. Gastroesophageal reflux disease, unspecified whether esophagitis present  K21.9 Ambulatory referral to Gastroenterology   sx persistent, discussed diet/lifestyle efforts including stop eating 2-3 hours prior to bedtime, elevate head of bed, avoid triggers, continue PPI f/up GI    2. Dysphagia, unspecified type  R13.10 Ambulatory referral to Gastroenterology   difficulty with chewing and swallowing food bolus, cannot get it to pass in back of throat, sometimes  chokes or regurgitates, softer food/smaller bites    3. Candida infection, esophageal (HCC)  B37.81 Ambulatory referral to Gastroenterology   hx of - sx have been similar to prior dx - we treated with same meds as GI which worked in the past, stopped flonase, no improvement    4. Weight loss  R63.4 Ambulatory referral to Gastroenterology   f/up GI       -Red flags and when to present for emergency care or RTC including fever >101.59F, chest pain, shortness of breath, new/worsening/un-resolving symptoms, reviewed with patient at time of visit. Follow up and care instructions discussed and provided in AVS. - I discussed the assessment and treatment plan with the patient. The patient was provided an opportunity to ask questions and all were answered. The patient agreed with the plan and demonstrated an understanding of the instructions.  I provided 15 minutes of non-face-to-face time during this encounter.  Danelle Berry, PA-C 09/10/22 2:27 PM

## 2022-09-13 DIAGNOSIS — M1712 Unilateral primary osteoarthritis, left knee: Secondary | ICD-10-CM | POA: Diagnosis not present

## 2022-09-13 DIAGNOSIS — M5416 Radiculopathy, lumbar region: Secondary | ICD-10-CM | POA: Diagnosis not present

## 2022-09-23 ENCOUNTER — Telehealth: Payer: Self-pay

## 2022-09-23 ENCOUNTER — Other Ambulatory Visit: Payer: Self-pay

## 2022-09-23 ENCOUNTER — Ambulatory Visit: Payer: Medicare HMO | Admitting: Physician Assistant

## 2022-09-23 ENCOUNTER — Encounter: Payer: Self-pay | Admitting: Physician Assistant

## 2022-09-23 VITALS — BP 106/74 | HR 76 | Temp 98.0°F | Ht 67.0 in | Wt 118.8 lb

## 2022-09-23 DIAGNOSIS — R1314 Dysphagia, pharyngoesophageal phase: Secondary | ICD-10-CM | POA: Diagnosis not present

## 2022-09-23 DIAGNOSIS — K219 Gastro-esophageal reflux disease without esophagitis: Secondary | ICD-10-CM | POA: Diagnosis not present

## 2022-09-23 DIAGNOSIS — B370 Candidal stomatitis: Secondary | ICD-10-CM | POA: Diagnosis not present

## 2022-09-23 DIAGNOSIS — R1310 Dysphagia, unspecified: Secondary | ICD-10-CM

## 2022-09-23 DIAGNOSIS — B3781 Candidal esophagitis: Secondary | ICD-10-CM

## 2022-09-23 MED ORDER — PEG 3350-KCL-NA BICARB-NACL 420 G PO SOLR: 4000.0000 mL | Freq: Once | ORAL | 0 refills | Status: DC

## 2022-09-23 MED ORDER — NYSTATIN 100000 UNIT/ML MT SUSP: 5.0000 mL | Freq: Four times a day (QID) | OROMUCOSAL | 0 refills | Status: DC

## 2022-09-23 MED ORDER — POLYETHYLENE GLYCOL 3350 17 GM/SCOOP PO POWD: ORAL | 0 refills | Status: DC

## 2022-09-23 NOTE — Telephone Encounter (Signed)
Tried reaching patient-mail box full-unable to leave message- patient currently scheduled 5/29 for EGD however that day is no longer available and needs to be moved.

## 2022-09-23 NOTE — Progress Notes (Signed)
Celso Amy, PA-C [address]  [address]  [place], [place] [address]  Main: [phone]  Fax: [phone]   Gastroenterology Consultation  Referring Provider:     Danelle Berry, PA-C Primary Care Physician:  Danelle Berry, PA-C Primary Gastroenterologist:  Dr. Wyline Mood / Celso Amy, PA-C  Reason for Consultation:     GERD        HPI:   Ebony [name] is a 72 y.o. y/o female referred for consultation & management  by Danelle Berry, PA-C.    She is taking pantoprazole 40 Mg once daily with fair control of acid reflux.  She is having some breakthrough heartburn.  History of esophageal candidiasis.  She reports recurrent odynophagia with eating and drinking.  She is worried about recurrent esophageal candidiasis.  Mouth is very irritated.  She took Keflex antibiotic in March.  Has also been treated with prednisone recently for back injury.  Uses Flonase steroid nasal inhaler as needed.  She does not use any asthma medications.  EGD 05/2020 showed diffuse white plaques in the entire esophagus consistent with esophageal candidiasis.  Biopsies confirmed acute candidal esophagitis.  Treated with Diflucan.  Repeat EGD done by Dr. Tobi Bastos 08/2020 showed evidence of esophageal candidiasis in the middle third of the esophagus.  KOH prep positive for yeast.  No stricture.  Normal stomach and duodenum.  Treated with Diflucan.   Colonoscopy 05/2017 by Dr. Servando Snare showed fair prep, nonbleeding internal hemorrhoids, and no polyps.  Repeat in 10 years.   Past Medical History:  Diagnosis Date   Adjustment disorder with mixed anxiety and depressed mood 10/11/2015   Allergy    Anemia    Arthritis    knees, hands   Borderline diabetes mellitus    Cholelithiasis    Complication of anesthesia    slow to wake   Dry eye syndrome of both eyes    Dysphagia 06/12/2020   GERD (gastroesophageal reflux disease)    Hypercholesterolemia    Loss of weight 12/17/2016   Pain in joint of left knee  12/14/2020   Palpitation 09/29/2019   Prehypertension 12/17/2016   Wears dentures    full upper and lower    Past Surgical History:  Procedure Laterality Date   ABDOMINAL HYSTERECTOMY     complete   COLONOSCOPY WITH PROPOFOL N/A 05/09/2017   Procedure: COLONOSCOPY WITH PROPOFOL;  Surgeon: Midge Minium, MD;  Location: [facility];  Service: Endoscopy;  Laterality: N/A;   ESOPHAGOGASTRODUODENOSCOPY (EGD) WITH PROPOFOL N/A 05/30/2020   Procedure: ESOPHAGOGASTRODUODENOSCOPY (EGD) WITH PROPOFOL;  Surgeon: Wyline Mood, MD;  Location: [facility] ENDOSCOPY;  Service: Gastroenterology;  Laterality: N/A;   ESOPHAGOGASTRODUODENOSCOPY (EGD) WITH PROPOFOL N/A 08/07/2020   Procedure: ESOPHAGOGASTRODUODENOSCOPY (EGD) WITH PROPOFOL;  Surgeon: Wyline Mood, MD;  Location[facility] END[facility]COPY;  Service: Gastroenterology;  Laterality: N/A;   EYE SURGERY  08/04/2017   KNEE SURGERY Bilateral     Prior to Admission medications   Medication Sig Start Date End Date Taking? Authorizing Provider  acetaminophen (TYLENOL) 500 MG tablet Take 1-2 tablets (500-1,000 mg total) by mouth every 8 (eight) hours as needed for moderate pain. OTC 05/02/20   Danelle Berry, PA-C  Calcium Carbonate-Vit D-Min (CALCIUM 1200 PO) Take 600 mg by mouth daily.     [provider]  Cholecalciferol (VITAMIN D) 125 MCG (5000 UT) CAPS Take 1 capsule by mouth daily.    [provider]  CINNAMON PO Take by mouth.    [provider]  Cyanocobalamin (VITAMIN B-12)  1000 MCG SUBL Place 1,000 mcg under the tongue daily.     [provider]  fluconazole (DIFLUCAN) 200 MG tablet Take 400 mg po qd x1 then take 200 mg po qd x 13 days 08/12/22   Danelle Berry, PA-C  ipratropium (ATROVENT) 0.06 % nasal spray Place 2 sprays into both nostrils 4 (four) times daily. 08/12/22   Danelle Berry, PA-C  levocetirizine (XYZAL) 5 MG tablet Take 1 tablet (5 mg total) by mouth every evening. 08/12/22   Danelle Berry, PA-C  lidocaine (LIDODERM) 5  % Place 1 patch onto the skin daily. As needed for msk pain 08/27/22   Danelle Berry, PA-C  meloxicam (MOBIC) 7.5 MG tablet Take 7.5 mg by mouth daily. 09/25/21   [provider]  nystatin (MYCOSTATIN) 100000 UNIT/ML suspension USE AS DIRECTED 5 MLS (500,000 UNITS TOTAL) IN THE MOUTH OR THROAT 4 (FOUR) TIMES DAILY. 06/26/20   Mauro Kaufmann, NP  nystatin (MYCOSTATIN) 100000 UNIT/ML suspension Take 5 mLs (500,000 Units total) by mouth 4 (four) times daily. 08/12/22   Danelle Berry, PA-C  pantoprazole (PROTONIX) 40 MG tablet Take 1 tablet (40 mg total) by mouth daily as needed. For GERD/acid reflux 08/12/22   Danelle Berry, PA-C  predniSONE (DELTASONE) 20 MG tablet 2 tabs poqday 1-3, 1 tabs poqday 4-6 09/05/22   Danelle Berry, PA-C  REFRESH CELLUVISC 1 % GEL  07/19/19   [provider]  RESTASIS 0.05 % ophthalmic emulsion Instill 1 drop into both eyes twice a day 03/08/20   [provider]  rosuvastatin (CRESTOR) 10 MG tablet Take 1 tablet (10 mg total) by mouth daily. 08/20/22   Danelle Berry, PA-C  tiZANidine (ZANAFLEX) 4 MG tablet Take 1 tablet (4 mg total) by mouth every 8 (eight) hours as needed for muscle spasms (muscle tightness). 09/13/21   Berniece Salines, FNP  traMADol (ULTRAM) 50 MG tablet Take 1 tablet (50 mg total) by mouth every 12 (twelve) hours as needed. 09/05/22   Danelle Berry, PA-C  TURMERIC PO Take by mouth.    [provider]  TYRVAYA 0.03 MG/ACT SOLN  05/23/21   [provider]    Family History  Problem Relation Age of Onset   Hypertension Mother    Stroke Mother        4 mini strokes   Thyroid disease Mother    Kidney failure Father    Diabetes Father    Hypertension Father    Kidney disease Father    Emphysema Maternal Grandmother    Diabetes Maternal Grandmother    Hypertension Maternal Grandmother    Diabetes Maternal Grandfather    Alcohol abuse Maternal Grandfather    Diabetes Paternal Grandmother    Heart attack Paternal  Grandmother    Emphysema Paternal Grandfather    Diabetes Paternal Grandfather    Heart disease Paternal Grandfather    Thyroid disease Sister    Vitamin D deficiency Sister    Arthritis Sister    COPD Sister    Fibromyalgia Sister    Hypertension Sister    Hyperlipidemia Sister    Asthma Sister    Thyroid disease Sister    Breast cancer Other    Cancer Neg Hx      Social History   Tobacco Use   Smoking status: Never   Smokeless tobacco: Never  Vaping Use   Vaping Use: Never used  Substance Use Topics   Alcohol use: No   Drug use: No    Allergies as of  09/23/2022 - Review Complete 09/10/2022  Allergen Reaction Noted   Celecoxib Anxiety, Itching, and Other (See Comments) 07/11/2015    Review of Systems:    All systems reviewed and negative except where noted in HPI.   Physical Exam:  BP 106/74   Pulse 76   Temp 98 F (36.7 C)   Ht 5\' 7"  (1.702 m)   Wt 118 lb 12.8 oz (53.9 kg)   BMI 18.61 kg/m  No LMP recorded. Patient has had a hysterectomy. Psych:  Alert and cooperative. Normal mood and affect. General:   Alert,  Well-developed, well-nourished, pleasant and cooperative in NAD Head:  Normocephalic and atraumatic. Eyes:  Sclera clear, no icterus.   Conjunctiva pink. Mouth: Is very red and irritated, a few white patches on her tongue and buccal mucosa. Neck:  Supple; no masses or thyromegaly. Lungs:  Respirations even and unlabored.  Clear throughout to auscultation.   No wheezes, crackles, or rhonchi. No acute distress. Heart:  Regular rate and rhythm; no murmurs, clicks, rubs, or gallops. Abdomen:  Normal bowel sounds.  No bruits.  Soft, and non-distended without masses, hepatosplenomegaly or hernias noted.  No Tenderness.  No guarding or rebound tenderness.    Neurologic:  Alert and oriented x3;  grossly normal neurologically. Psych:  Alert and cooperative. Normal mood and affect.  Imaging Studies: No results found.  Assessment and Plan:   SRIJA CHRISLEY is a 72 y.o. y/o female has been referred for   Esophageal Candidiasis  Scheduling Repeat EGD I discussed risks of EGD with patient to include risk of bleeding, perforation, and risk of sedation.   Patient expressed understanding and agrees to proceed with EGD.   2.   Dysphagia  Scheduling Repeat EGD to check for Recurrent Candida  3.   GERD  Continue pantoprazole 40 Mg once daily  Add OTC famotidine 20 Mg once or twice daily  Recommend Lifestyle Modifications to prevent Acid Reflux.  Rec. Avoid coffee, sodas, peppermint, citrus fruits, and spicey foods.  Avoid eating 2-3 hours before bedtime.   4.   Thrush  Rx: Nystatin Suspension  Follow up in 1 month after EGD with TG or Dr. Tobi Bastos.  Celso Amy, PA-C

## 2022-09-24 ENCOUNTER — Telehealth: Payer: Self-pay

## 2022-09-24 ENCOUNTER — Encounter: Payer: Self-pay | Admitting: Family Medicine

## 2022-09-24 NOTE — Telephone Encounter (Signed)
Tried reaching patient to let her know we need to reschedule her Procedures from /29 to later date-also left VM on her husbands phone to call office.

## 2022-10-04 DIAGNOSIS — M5416 Radiculopathy, lumbar region: Secondary | ICD-10-CM | POA: Diagnosis not present

## 2022-10-10 ENCOUNTER — Ambulatory Visit: Payer: Medicare HMO

## 2022-10-15 ENCOUNTER — Encounter: Payer: Self-pay | Admitting: Gastroenterology

## 2022-10-21 ENCOUNTER — Encounter: Payer: Self-pay | Admitting: Gastroenterology

## 2022-10-22 ENCOUNTER — Other Ambulatory Visit: Payer: Self-pay

## 2022-10-22 ENCOUNTER — Encounter: Payer: Self-pay | Admitting: Gastroenterology

## 2022-10-22 ENCOUNTER — Ambulatory Visit
Admission: RE | Admit: 2022-10-22 | Discharge: 2022-10-22 | Disposition: A | Discharge status: Home / Self Care | Payer: Medicare HMO | Attending: Gastroenterology | Admitting: Gastroenterology

## 2022-10-22 ENCOUNTER — Ambulatory Visit: Payer: Medicare HMO | Admitting: Anesthesiology

## 2022-10-22 ENCOUNTER — Encounter
Admission: RE | Disposition: A | Discharge status: Home / Self Care | Payer: Self-pay | Source: Home / Self Care | Attending: Gastroenterology

## 2022-10-22 DIAGNOSIS — K219 Gastro-esophageal reflux disease without esophagitis: Secondary | ICD-10-CM | POA: Diagnosis not present

## 2022-10-22 DIAGNOSIS — K224 Dyskinesia of esophagus: Secondary | ICD-10-CM | POA: Diagnosis not present

## 2022-10-22 DIAGNOSIS — E7800 Pure hypercholesterolemia, unspecified: Secondary | ICD-10-CM | POA: Insufficient documentation

## 2022-10-22 DIAGNOSIS — B3781 Candidal esophagitis: Secondary | ICD-10-CM | POA: Insufficient documentation

## 2022-10-22 DIAGNOSIS — M19042 Primary osteoarthritis, left hand: Secondary | ICD-10-CM | POA: Diagnosis not present

## 2022-10-22 DIAGNOSIS — M19041 Primary osteoarthritis, right hand: Secondary | ICD-10-CM | POA: Diagnosis not present

## 2022-10-22 DIAGNOSIS — R1310 Dysphagia, unspecified: Secondary | ICD-10-CM

## 2022-10-22 DIAGNOSIS — M170 Bilateral primary osteoarthritis of knee: Secondary | ICD-10-CM | POA: Insufficient documentation

## 2022-10-22 HISTORY — PX: ESOPHAGOGASTRODUODENOSCOPY (EGD) WITH PROPOFOL: SHX5813

## 2022-10-22 HISTORY — PX: ESOPHAGEAL BRUSHING: SHX6842

## 2022-10-22 HISTORY — PX: BIOPSY: SHX5522

## 2022-10-22 LAB — KOH PREP: Special Requests: NORMAL

## 2022-10-22 SURGERY — ESOPHAGOGASTRODUODENOSCOPY (EGD) WITH PROPOFOL
Anesthesia: General

## 2022-10-22 MED ORDER — GLYCOPYRROLATE 0.2 MG/ML IJ SOLN
INTRAMUSCULAR | Status: DC | PRN
  Administered 2022-10-22: .2 mg via INTRAVENOUS

## 2022-10-22 MED ORDER — PROPOFOL 10 MG/ML IV BOLUS
INTRAVENOUS | Status: AC
  Filled 2022-10-22: qty 40

## 2022-10-22 MED ORDER — PROPOFOL 500 MG/50ML IV EMUL
INTRAVENOUS | Status: DC | PRN
  Administered 2022-10-22: 100 ug/kg/min via INTRAVENOUS

## 2022-10-22 MED ORDER — GLYCOPYRROLATE 0.2 MG/ML IJ SOLN
INTRAMUSCULAR | Status: AC
  Filled 2022-10-22: qty 1

## 2022-10-22 MED ORDER — LIDOCAINE HCL (PF) 2 % IJ SOLN
INTRAMUSCULAR | Status: AC
  Filled 2022-10-22: qty 5

## 2022-10-22 MED ORDER — LIDOCAINE HCL (CARDIAC) PF 100 MG/5ML IV SOSY
PREFILLED_SYRINGE | INTRAVENOUS | Status: DC | PRN
  Administered 2022-10-22: 30 mg via INTRAVENOUS

## 2022-10-22 MED ORDER — SODIUM CHLORIDE 0.9 % IV SOLN: INTRAVENOUS | Status: DC

## 2022-10-22 MED ORDER — PROPOFOL 10 MG/ML IV BOLUS
INTRAVENOUS | Status: DC | PRN
  Administered 2022-10-22: 50 mg via INTRAVENOUS

## 2022-10-22 NOTE — Anesthesia Preprocedure Evaluation (Signed)
Anesthesia Evaluation  Patient identified by MRN, date of birth, ID band Patient awake    Reviewed: Allergy & Precautions, NPO status , Patient's Chart, lab work & pertinent test results  History of Anesthesia Complications (+) PROLONGED EMERGENCE and history of anesthetic complications  Airway Mallampati: III  TM Distance: <3 FB Neck ROM: full    Dental  (+) Missing   Pulmonary neg pulmonary ROS, neg shortness of breath   Pulmonary exam normal        Cardiovascular Exercise Tolerance: Good (-) angina negative cardio ROS Normal cardiovascular exam     Neuro/Psych  PSYCHIATRIC DISORDERS       Neuromuscular disease    GI/Hepatic Neg liver ROS,GERD  Controlled,,  Endo/Other  negative endocrine ROS    Renal/GU negative Renal ROS  negative genitourinary   Musculoskeletal   Abdominal   Peds  Hematology negative hematology ROS (+)   Anesthesia Other Findings Patient reports that they do not think that any food or pills are stuck in their throat at this time.   Past Medical History: 10/11/2015: Adjustment disorder with mixed anxiety and depressed mood No date: Allergy No date: Anemia No date: Arthritis     Comment:  knees, hands No date: Borderline diabetes mellitus No date: Cholelithiasis No date: Complication of anesthesia     Comment:  slow to wake No date: Dry eye syndrome of both eyes 06/12/2020: Dysphagia No date: GERD (gastroesophageal reflux disease) No date: Hypercholesterolemia 12/17/2016: Loss of weight 12/14/2020: Pain in joint of left knee 09/29/2019: Palpitation 12/17/2016: Prehypertension No date: Wears dentures     Comment:  full upper and lower  Past Surgical History: No date: ABDOMINAL HYSTERECTOMY     Comment:  complete 05/09/2017: COLONOSCOPY WITH PROPOFOL; N/A     Comment:  Procedure: COLONOSCOPY WITH PROPOFOL;  Surgeon: Midge Minium, MD;  Location: [facility];   Service:               Endoscopy;  Laterality: N/A; 05/30/2020: ESOPHAGOGASTRODUODENOSCOPY (EGD) WITH PROPOFOL; N/A     Comment:  Procedure: ESOPHAGOGASTRODUODENOSCOPY (EGD) WITH               PROPOFOL;  Surgeon: Wyline Mood, MD;  Location: [facility]               ENDOSCOPY;  Service: Gastroenterology;  Laterality: N/A; 08/07/2020: ESOPHAGOGASTRODUODENOSCOPY (EGD) WITH PROPOFOL; N/A     Comment:  Procedure: ESOPHAGOGASTRODUODENOSCOPY (EGD) WITH               PROPOFOL;  Surgeon: Wyline Mood, MD;  Location: [facility]               ENDOSCOPY;  Service: Gastroenterology;  Laterality: N/A; 08/04/2017: EYE SURGERY No date: KNEE SURGERY; Bilateral  BMI    Body Mass Index: 18.64 kg/m      Reproductive/Obstetrics negative OB ROS                             Anesthesia Physical Anesthesia Plan  ASA: 2  Anesthesia Plan: General   Post-op Pain Management:    Induction: Intravenous  PONV Risk Score and Plan: Propofol infusion and TIVA  Airway Management Planned: Natural Airway and Nasal Cannula  Additional Equipment:   Intra-op Plan:   Post-operative Plan:   Informed Consent: I have reviewed the patients History and Physical, chart, labs and discussed the procedure including  the risks, benefits and alternatives for the proposed anesthesia with the patient or authorized representative who has indicated his/her understanding and acceptance.     Dental Advisory Given  Plan Discussed with: Anesthesiologist, CRNA and Surgeon  Anesthesia Plan Comments: (Patient consented for risks of anesthesia including but not limited to:  - adverse reactions to medications - risk of airway placement if required - damage to eyes, teeth, lips or other oral mucosa - nerve damage due to positioning  - sore throat or hoarseness - Damage to heart, brain, nerves, lungs, other parts of body or loss of life  Patient voiced understanding.)       Anesthesia Quick Evaluation

## 2022-10-22 NOTE — Anesthesia Postprocedure Evaluation (Signed)
Anesthesia Post Note  Patient: Ebony [name]  Procedure(s) Performed: ESOPHAGOGASTRODUODENOSCOPY (EGD) WITH PROPOFOL  Patient location during evaluation: Endoscopy Anesthesia Type: General Level of consciousness: awake and alert Pain management: pain level controlled Vital Signs Assessment: post-procedure vital signs reviewed and stable Respiratory status: spontaneous breathing, nonlabored ventilation, respiratory function stable and patient connected to nasal cannula oxygen Cardiovascular status: blood pressure returned to baseline and stable Postop Assessment: no apparent nausea or vomiting Anesthetic complications: no   No notable events documented.   Last Vitals:  Vitals:   10/22/22 0841 10/22/22 0851  BP: 128/84 124/88  Pulse: 75 75  Resp: 14 15  Temp:    SpO2: 98% 97%    Last Pain:  Vitals:   10/22/22 0851  TempSrc:   PainSc: 0-No pain                 Cleda Mccreedy Emelio Schneller

## 2022-10-22 NOTE — Progress Notes (Signed)
Ebony [name] inform  EGD: candida +ve : start on diflucan 200 mg a day for 14 days   Celso Amy (FYI)  Dr Wyline Mood MD,MRCP [facility]) Gastroenterology/Hepatology Pager: [phone]

## 2022-10-22 NOTE — Op Note (Signed)
[facility] Gastroenterology Patient Name: Ebony [name] Procedure Date: 10/22/2022 8:10 AM MRN: [id number] Account #: [id number] Date of Birth: 16-Aug-1950 Admit Type: Outpatient Age: 72 Room: [facility] ENDO ROOM 2 Gender: Female Note Status: Finalized Instrument Name: Upper Endoscope [id number] Procedure:             Upper GI endoscopy Indications:           Dysphagia, Odynophagia Providers:             Wyline Mood MD, MD Referring MD:          No Local Md, MD (Referring MD) Medicines:             Monitored Anesthesia Care Complications:         No immediate complications. Procedure:             Pre-Anesthesia Assessment:                        - Prior to the procedure, a History and Physical was                         performed, and patient medications, allergies and                         sensitivities were reviewed. The patient's tolerance                         of previous anesthesia was reviewed.                        - The risks and benefits of the procedure and the                         sedation options and risks were discussed with the                         patient. All questions were answered and informed                         consent was obtained.                        - ASA Grade Assessment: II - A patient with mild                         systemic disease.                        After obtaining informed consent, the endoscope was                         passed under direct vision. Throughout the procedure,                         the patient's blood pressure, pulse, and oxygen                         saturations were monitored continuously. The Endoscope                         was  introduced through the mouth, and advanced to the                         third part of duodenum. The upper GI endoscopy was                         accomplished with ease. The patient tolerated the                         procedure well. Findings:      Abnormal  motility was noted in the esophagus. The cricopharyngeus was       abnormal. There is a decrease in motility of the esophageal body. The       distal esophagus/lower esophageal sphincter is open. Normal peristalsis       not noted. Biopsies were taken with a cold forceps for histology.      Diffuse, white plaques were found in the entire esophagus. Brushings for       KOH prep were obtained in the entire esophagus.      The stomach was normal.      The examined duodenum was normal.      The cardia and gastric fundus were normal on retroflexion. Impression:            - Abnormal esophageal motility. Biopsied.                        - Esophageal plaques were found, suspicious for                         candidiasis. Brushings performed.                        - Normal stomach.                        - Normal examined duodenum. Recommendation:        - Discharge patient to home (with escort).                        - Resume previous diet.                        - Continue present medications.                        - Await pathology results.                        - Return to my office in 8 weeks. Procedure Code(s):     --- Professional ---                        [phone], Esophagogastroduodenoscopy, flexible,                         transoral; with biopsy, single or multiple Diagnosis Code(s):     --- Professional ---                        K22.4, Dyskinesia of esophagus  K22.9, Disease of esophagus, unspecified                        R13.10, Dysphagia, unspecified CPT copyright 2022 [employer] Association. All rights reserved. The codes documented in this report are preliminary and upon coder review may  be revised to meet current compliance requirements. Wyline Mood, MD Wyline Mood MD, MD 10/22/2022 8:28:38 AM This report has been signed electronically. Number of Addenda: 0 Note Initiated On: 10/22/2022 8:10 AM Estimated Blood Loss:  Estimated blood loss:  none.      [facility]

## 2022-10-22 NOTE — H&P (Signed)
Wyline Mood, MD [address], [place], [place], [address] [address], [place], [place], [address] Phone: [phone]  Fax: [phone]  Primary Care Physician:  Berniece Salines, FNP   Pre-Procedure History & Physical: HPI:  Ebony [name] is a 72 y.o. female is here for an endoscopy    Past Medical History:  Diagnosis Date   Adjustment disorder with mixed anxiety and depressed mood 10/11/2015   Allergy    Anemia    Arthritis    knees, hands   Borderline diabetes mellitus    Cholelithiasis    Complication of anesthesia    slow to wake   Dry eye syndrome of both eyes    Dysphagia 06/12/2020   GERD (gastroesophageal reflux disease)    Hypercholesterolemia    Loss of weight 12/17/2016   Pain in joint of left knee 12/14/2020   Palpitation 09/29/2019   Prehypertension 12/17/2016   Wears dentures    full upper and lower    Past Surgical History:  Procedure Laterality Date   ABDOMINAL HYSTERECTOMY     complete   COLONOSCOPY WITH PROPOFOL N/A 05/09/2017   Procedure: COLONOSCOPY WITH PROPOFOL;  Surgeon: Midge Minium, MD;  Location: [facility];  Service: Endoscopy;  Laterality: N/A;   ESOPHAGOGASTRODUODENOSCOPY (EGD) WITH PROPOFOL N/A 05/30/2020   Procedure: ESOPHAGOGASTRODUODENOSCOPY (EGD) WITH PROPOFOL;  Surgeon: Wyline Mood, MD;  Location: [facility] ENDOSCOPY;  Service: Gastroenterology;  Laterality: N/A;   ESOPHAGOGASTRODUODENOSCOPY (EGD) WITH PROPOFOL N/A 08/07/2020   Procedure: ESOPHAGOGASTRODUODENOSCOPY (EGD) WITH PROPOFOL;  Surgeon: Wyline Mood, MD;  Location[facility];  Service: Gastroenterology;  Laterality: N/A;   EYE SURGERY  08/04/2017   KNEE SURGERY Bilateral     Prior to Admission medications   Medication Sig Start Date End Date Taking? Authorizing Provider  acetaminophen (TYLENOL) 500 MG tablet Take 1-2 tablets (500-1,000 mg total) by mouth every 8 (eight) hours as needed for moderate pain. OTC 05/02/20   Danelle Berry, PA-C   Calcium Carbonate-Vit D-Min (CALCIUM 1200 PO) Take 600 mg by mouth daily.     [provider]  Cholecalciferol (VITAMIN D) 125 MCG (5000 UT) CAPS Take 1 capsule by mouth daily.    [provider]  CINNAMON PO Take by mouth.    [provider]  Cyanocobalamin (VITAMIN B-12) 1000 MCG SUBL Place 1,000 mcg under the tongue daily.     [provider]  fluconazole (DIFLUCAN) 200 MG tablet Take 400 mg po qd x1 then take 200 mg po qd x 13 days 08/12/22   Danelle Berry, PA-C  gabapentin (NEURONTIN) 100 MG capsule Take by mouth at bedtime. 09/13/22   [provider]  ipratropium (ATROVENT) 0.06 % nasal spray Place 2 sprays into both nostrils 4 (four) times daily. 08/12/22   Danelle Berry, PA-C  levocetirizine (XYZAL) 5 MG tablet Take 1 tablet (5 mg total) by mouth every evening. 08/12/22   Danelle Berry, PA-C  lidocaine (LIDODERM) 5 % Place 1 patch onto the skin daily. As needed for msk pain 08/27/22   Danelle Berry, PA-C  meloxicam (MOBIC) 7.5 MG tablet Take 7.5 mg by mouth daily. 09/25/21   [provider]  nystatin (MYCOSTATIN) 100000 UNIT/ML suspension USE AS DIRECTED 5 MLS (500,000 UNITS TOTAL) IN THE MOUTH OR THROAT 4 (FOUR) TIMES DAILY. 06/26/20   Mauro Kaufmann, NP  nystatin (MYCOSTATIN) 100000 UNIT/ML suspension Take 5 mLs (500,000 Units total) by mouth 4 (four) times daily. 09/23/22   Celso Amy, PA-C  pantoprazole (PROTONIX) 40 MG tablet Take 1 tablet (40 mg total) by mouth daily as needed. For GERD/acid reflux 08/12/22   Danelle Berry, PA-C  predniSONE (DELTASONE) 20 MG tablet 2 tabs poqday 1-3, 1 tabs poqday 4-6 09/05/22   Danelle Berry, PA-C  REFRESH CELLUVISC 1 % GEL  07/19/19   [provider]  RESTASIS 0.05 % ophthalmic emulsion Instill 1 drop into both eyes twice a day 03/08/20   [provider]  rosuvastatin (CRESTOR) 10 MG tablet Take 1 tablet (10 mg total) by mouth daily. 08/20/22   Danelle Berry, PA-C  tiZANidine (ZANAFLEX) 4 MG  tablet Take 1 tablet (4 mg total) by mouth every 8 (eight) hours as needed for muscle spasms (muscle tightness). 09/13/21   Berniece Salines, FNP  traMADol (ULTRAM) 50 MG tablet Take 1 tablet (50 mg total) by mouth every 12 (twelve) hours as needed. 09/05/22   Danelle Berry, PA-C  TURMERIC PO Take by mouth.    [provider]  TYRVAYA 0.03 MG/ACT SOLN  05/23/21   [provider]    Allergies as of 09/23/2022 - Review Complete 09/23/2022  Allergen Reaction Noted   Celecoxib Anxiety, Itching, and Other (See Comments) 07/11/2015    Family History  Problem Relation Age of Onset   Hypertension Mother    Stroke Mother        4 mini strokes   Thyroid disease Mother    Kidney failure Father    Diabetes Father    Hypertension Father    Kidney disease Father    Emphysema Maternal Grandmother    Diabetes Maternal Grandmother    Hypertension Maternal Grandmother    Diabetes Maternal Grandfather    Alcohol abuse Maternal Grandfather    Diabetes Paternal Grandmother    Heart attack Paternal Grandmother    Emphysema Paternal Grandfather    Diabetes Paternal Grandfather    Heart disease Paternal Grandfather    Thyroid disease Sister    Vitamin D deficiency Sister    Arthritis Sister    COPD Sister    Fibromyalgia Sister    Hypertension Sister    Hyperlipidemia Sister    Asthma Sister    Thyroid disease Sister    Breast cancer Other    Cancer Neg Hx     Social History   Socioeconomic History   Marital status: Married    Spouse name: Not on file   Number of children: 3   Years of education: Not on file   Highest education level: High school graduate  Occupational History   Not on file  Tobacco Use   Smoking status: Never   Smokeless tobacco: Never  Vaping Use   Vaping Use: Never used  Substance and Sexual Activity   Alcohol use: No   Drug use: No   Sexual activity: Not Currently  Other Topics Concern   Not on file  Social History Narrative   Not on file    Social Determinants of Health   Financial Resource Strain: Low Risk  (06/28/2022)   Overall Financial Resource Strain (CARDIA)    Difficulty of Paying Living Expenses: Not hard at all  Food Insecurity: No Food Insecurity (06/28/2022)   Hunger Vital Sign    Worried About Running Out of Food in the Last Year: Never true    Ran Out of Food in the Last Year: Never true  Transportation Needs: No Transportation Needs (06/28/2022)   PRAPARE - Administrator, Civil Service (Medical): No  Lack of Transportation (Non-Medical): No  Physical Activity: Sufficiently Active (06/28/2022)   Exercise Vital Sign    Days of Exercise per Week: 7 days    Minutes of Exercise per Session: 60 min  Stress: No Stress Concern Present (06/28/2022)   [employer] of Occupational Health - Occupational Stress Questionnaire    Feeling of Stress : Not at all  Social Connections: Moderately Integrated (06/28/2022)   Social Connection and Isolation Panel [NHANES]    Frequency of Communication with Friends and Family: More than three times a week    Frequency of Social Gatherings with Friends and Family: More than three times a week    Attends Religious Services: More than 4 times per year    Active Member of [employer] or Organizations: No    Attends Banker Meetings: Never    Marital Status: Married  Catering manager Violence: Not At Risk (06/28/2022)   Humiliation, Afraid, Rape, and Kick questionnaire    Fear of Current or Ex-Partner: No    Emotionally Abused: No    Physically Abused: No    Sexually Abused: No    Review of Systems: See HPI, otherwise negative ROS  Physical Exam: There were no vitals taken for this visit. General:   Alert,  pleasant and cooperative in NAD Head:  Normocephalic and atraumatic. Neck:  Supple; no masses or thyromegaly. Lungs:  Clear throughout to auscultation, normal respiratory effort.    Heart:  +S1, +S2, Regular rate and rhythm, No edema. Abdomen:   Soft, nontender and nondistended. Normal bowel sounds, without guarding, and without rebound.   Neurologic:  Alert and  oriented x4;  grossly normal neurologically.  Impression/Plan: [name] is here for an endoscopy  to be performed for  evaluation of odynophagia    Risks, benefits, limitations, and alternatives regarding endoscopy have been reviewed with the patient.  Questions have been answered.  All parties agreeable.   Wyline Mood, MD  10/22/2022, 7:37 AM

## 2022-10-22 NOTE — Transfer of Care (Signed)
Immediate Anesthesia Transfer of Care Note  Patient: Ebony [name]  Procedure(s) Performed: ESOPHAGOGASTRODUODENOSCOPY (EGD) WITH PROPOFOL  Patient Location: PACU  Anesthesia Type:General  Level of Consciousness: drowsy  Airway & Oxygen Therapy: Patient Spontanous Breathing  Post-op Assessment: Report given to RN and Post -op Vital signs reviewed and stable  Post vital signs: stable  Last Vitals:  Vitals Value Taken Time  BP 122/83 10/22/22 0831  Temp    Pulse 80 10/22/22 0831  Resp 13 10/22/22 0831  SpO2 100 % 10/22/22 0831  Vitals shown include unvalidated device data.  Last Pain:  Vitals:   10/22/22 0831  TempSrc:   PainSc: Asleep         Complications: No notable events documented.

## 2022-10-23 ENCOUNTER — Encounter: Payer: Self-pay | Admitting: Gastroenterology

## 2022-10-24 ENCOUNTER — Telehealth: Payer: Self-pay

## 2022-10-24 ENCOUNTER — Encounter: Payer: Self-pay | Admitting: Gastroenterology

## 2022-10-24 ENCOUNTER — Ambulatory Visit: Payer: Medicare HMO | Admitting: Physician Assistant

## 2022-10-24 MED ORDER — FLUCONAZOLE 200 MG PO TABS: 200.0000 mg | ORAL_TABLET | Freq: Every day | ORAL | 0 refills | Status: DC

## 2022-10-24 NOTE — Telephone Encounter (Signed)
Patient notified of EGD results-Diflucan sent to pharmacy.

## 2022-10-24 NOTE — Telephone Encounter (Signed)
Looking into patient's chart, I realized that Velna Hatchet had contacted her earlier today.

## 2022-10-24 NOTE — Telephone Encounter (Signed)
-----   Message from Celso Amy, [place] sent at 10/24/2022  8:23 AM EDT ----- Regarding: Esophageal Candidiasis Kandis Cocking, this patient has follow-up OV with me today at 1 PM.  Has she been started on Diflucan yet? Inetta Fermo ----- Message ----- From: Wyline Mood, MD Sent: 10/22/2022   9:06 AM EDT To: Adela Ports, CMA; Celso Amy, PA-C  Marquesa Rath inform  EGD: candida +ve : start on diflucan 200 mg a day for 14 days   Celso Amy (FYI)  Dr Wyline Mood MD,MRCP [facility]) Gastroenterology/Hepatology Pager: [phone]

## 2022-10-24 NOTE — Telephone Encounter (Signed)
Called patient but her voicemail is not set up. Therefore, I was not able to leave her a message. I will have to call her back.

## 2022-10-28 ENCOUNTER — Other Ambulatory Visit: Payer: Self-pay

## 2022-10-28 MED ORDER — FLUCONAZOLE 200 MG PO TABS: 200.0000 mg | ORAL_TABLET | Freq: Every day | ORAL | 0 refills | Status: AC

## 2022-10-30 DIAGNOSIS — M5416 Radiculopathy, lumbar region: Secondary | ICD-10-CM | POA: Diagnosis not present

## 2022-11-04 DIAGNOSIS — M1711 Unilateral primary osteoarthritis, right knee: Secondary | ICD-10-CM | POA: Diagnosis not present

## 2022-11-04 DIAGNOSIS — M5416 Radiculopathy, lumbar region: Secondary | ICD-10-CM | POA: Diagnosis not present

## 2022-11-04 DIAGNOSIS — M47896 Other spondylosis, lumbar region: Secondary | ICD-10-CM | POA: Diagnosis not present

## 2022-11-10 NOTE — Progress Notes (Deleted)
Celso Amy, PA-C [address]  [address]  [place], [place] [address]  Main: [phone]  Fax: [phone]   Primary Care Physician: Berniece Salines, FNP  Primary Gastroenterologist:  Celso Amy, PA-C / Dr. Wyline Mood    CC: Follow-up esophageal candidiasis and GERD  HPI: DAKODAH GARRAND is a 72 y.o. female returns for follow-up of recurrent esophageal candidiasis and GERD.  She is taking pantoprazole 40 Mg once daily.  EGD done 10/22/2022 by Dr. Tobi Bastos showed diffuse white plaques throughout the entire esophagus and biopsies consistent with esophageal candidiasis.  Moderate esophageal dysmotility, normal stomach, and normal duodenum.  She was treated with Diflucan 200 mg daily for 14 days.  She took Keflex in March and prednisone in the past few months.  Uses Flonase steroid nasal inhaler.  No added medications.  EGDs done 05/25/2020 and 08/23/2020 showed esophageal candidiasis treated with Diflucan.  Colonoscopy 05/2017 by Dr. Servando Snare showed fair prep, nonbleeding internal hemorrhoids, and no polyps.  Repeat in 10 years.   Most recent labs 08/12/2022 showed hemoglobin A1c 6.3, glucose 115, normal LFTs, normal renal function (GFR 99).  Current Outpatient Medications  Medication Sig Dispense Refill   fluconazole (DIFLUCAN) 200 MG tablet Take 1 tablet (200 mg total) by mouth daily for 14 days. 14 tablet 0   acetaminophen (TYLENOL) 500 MG tablet Take 1-2 tablets (500-1,000 mg total) by mouth every 8 (eight) hours as needed for moderate pain. OTC 30 tablet 0   Calcium Carbonate-Vit D-Min (CALCIUM 1200 PO) Take 600 mg by mouth daily.      Cholecalciferol (VITAMIN D) 125 MCG (5000 UT) CAPS Take 1 capsule by mouth daily.     CINNAMON PO Take by mouth.     Cyanocobalamin (VITAMIN B-12) 1000 MCG SUBL Place 1,000 mcg under the tongue daily.      gabapentin (NEURONTIN) 100 MG capsule Take by mouth at bedtime. (Patient not taking: Reported on 10/22/2022)     ipratropium (ATROVENT) 0.06 %  nasal spray Place 2 sprays into both nostrils 4 (four) times daily. 15 mL 12   levocetirizine (XYZAL) 5 MG tablet Take 1 tablet (5 mg total) by mouth every evening. 30 tablet 11   lidocaine (LIDODERM) 5 % Place 1 patch onto the skin daily. As needed for msk pain 30 patch 2   meloxicam (MOBIC) 7.5 MG tablet Take 7.5 mg by mouth daily.     nystatin (MYCOSTATIN) 100000 UNIT/ML suspension USE AS DIRECTED 5 MLS (500,000 UNITS TOTAL) IN THE MOUTH OR THROAT 4 (FOUR) TIMES DAILY. 120 mL 0   nystatin (MYCOSTATIN) 100000 UNIT/ML suspension Take 5 mLs (500,000 Units total) by mouth 4 (four) times daily. 60 mL 0   pantoprazole (PROTONIX) 40 MG tablet Take 1 tablet (40 mg total) by mouth daily as needed. For GERD/acid reflux 30 tablet 1   predniSONE (DELTASONE) 20 MG tablet 2 tabs poqday 1-3, 1 tabs poqday 4-6 (Patient not taking: Reported on 10/22/2022) 9 tablet 0   REFRESH CELLUVISC 1 % GEL      RESTASIS 0.05 % ophthalmic emulsion Instill 1 drop into both eyes twice a day     rosuvastatin (CRESTOR) 10 MG tablet Take 1 tablet (10 mg total) by mouth daily. (Patient not taking: Reported on 10/22/2022) 90 tablet 1   tiZANidine (ZANAFLEX) 4 MG tablet Take 1 tablet (4 mg total) by mouth every 8 (eight) hours as needed for muscle spasms (muscle tightness). (Patient not taking: Reported on 10/22/2022) 30 tablet 0  traMADol (ULTRAM) 50 MG tablet Take 1 tablet (50 mg total) by mouth every 12 (twelve) hours as needed. 10 tablet 0   TURMERIC PO Take by mouth.     TYRVAYA 0.03 MG/ACT SOLN      No current facility-administered medications for this visit.    Allergies as of 11/11/2022 - Review Complete 10/22/2022  Allergen Reaction Noted   Celecoxib Anxiety, Itching, and Other (See Comments) 07/11/2015   Flonase [fluticasone] Other (See Comments) 09/24/2022    Past Medical History:  Diagnosis Date   Adjustment disorder with mixed anxiety and depressed mood 10/11/2015   Allergy    Anemia    Arthritis    knees,  hands   Borderline diabetes mellitus    Cholelithiasis    Complication of anesthesia    slow to wake   Dry eye syndrome of both eyes    Dysphagia 06/12/2020   GERD (gastroesophageal reflux disease)    Hypercholesterolemia    Loss of weight 12/17/2016   Pain in joint of left knee 12/14/2020   Palpitation 09/29/2019   Prehypertension 12/17/2016   Wears dentures    full upper and lower    Past Surgical History:  Procedure Laterality Date   ABDOMINAL HYSTERECTOMY     complete   BIOPSY  10/22/2022   Procedure: BIOPSY;  Surgeon: Wyline Mood, MD;  Location: [facility] ENDOSCOPY;  Service: Gastroenterology;;   COLONOSCOPY WITH PROPOFOL N/A 05/09/2017   Procedure: COLONOSCOPY WITH PROPOFOL;  Surgeon: Midge Minium, MD;  Location: [facility];  Service: Endoscopy;  Laterality: N/A;   ESOPHAGEAL BRUSHING  10/22/2022   Procedure: ESOPHAGEAL BRUSHING;  Surgeon: Wyline Mood, MD;  Location: [facility] ENDOSCOPY;  Service: Gastroenterology;;   ESOPHAGOGASTRODUODENOSCOPY (EGD) WITH PROPOFOL N/A 05/30/2020   Procedure: ESOPHAGOGASTRODUODENOSCOPY (EGD) WITH PROPOFOL;  Surgeon: Wyline Mood, MD;  Location: [facility] ENDOSCOPY;  Service: Gastroenterology;  Laterality: N/A;   ESOPHAGOGASTRODUODENOSCOPY (EGD) WITH PROPOFOL N/A 08/07/2020   Procedure: ESOPHAGOGASTRODUODENOSCOPY (EGD) WITH PROPOFOL;  Surgeon: Wyline Mood, MD;  Location: [facility] ENDOSCOPY;  Service: Gastroenterology;  Laterality: N/A;   ESOPHAGOGASTRODUODENOSCOPY (EGD) WITH PROPOFOL N/A 10/22/2022   Procedure: ESOPHAGOGASTRODUODENOSCOPY (EGD) WITH PROPOFOL;  Surgeon: Wyline Mood, MD;  Location: [facility] ENDOSCOPY;  Service: Gastroenterology;  Laterality: N/A;   EYE SURGERY  08/04/2017   KNEE SURGERY Bilateral     Review of Systems:    All systems reviewed and negative except where noted in HPI.   Physical Examination:   There were no vitals taken for this visit.  General: Well-nourished, well-developed in no acute distress.  Eyes: No icterus. Conjunctivae  pink. Mouth: Oropharyngeal mucosa moist and pink , no lesions erythema or exudate. Lungs: Clear to auscultation bilaterally. Non-labored. Heart: Regular rate and rhythm, no murmurs rubs or gallops.  Abdomen: Bowel sounds are normal; Abdomen is Soft; No hepatosplenomegaly, masses or hernias;  No Abdominal Tenderness; No guarding or rebound tenderness. Extremities: No lower extremity edema. No clubbing or deformities. Neuro: Alert and oriented x 3.  Grossly intact. Skin: Warm and dry, no jaundice.   Psych: Alert and cooperative, normal mood and affect.   Imaging Studies: No results found.  Assessment and Plan:   JANAYSHA Aayush Gelpi is a 72 y.o. y/o female returns for follow-up of:  1.  Recurrent esophageal candidiasis 2.  GERD 3.  Esophageal dysmotility    Celso Amy, PA-C  Follow up in ***  BP check ***

## 2022-11-11 ENCOUNTER — Ambulatory Visit: Payer: Medicare HMO | Admitting: Physician Assistant

## 2022-12-27 ENCOUNTER — Ambulatory Visit: Payer: Medicare HMO | Admitting: Physician Assistant

## 2022-12-27 NOTE — Progress Notes (Deleted)
Celso Amy, PA-C [address]  [address]  [place], [place] [address]  Main: [phone]  Fax: [phone]   Primary Care Physician: Berniece Salines, FNP  Primary Gastroenterologist:  ***  CC: Follow-up GERD, dysphagia, esophageal candidiasis  HPI: Ebony [name] is a 72 y.o. female returns for 68-month follow-up of GERD and dysphagia.  Previous history of esophageal candidiasis.  EGD done 10/22/2022 by Dr. Tobi Bastos showed diffuse white plaques in the entire esophagus, consistent with esophageal candidiasis.  Biopsies positive for moderate yeast with pseudohyphae.  Stomach and duodenum were normal.  There was abnormal motility in the esophagus, consistent with esophageal dysmotility.  She was treated with Diflucan 200 mg once daily for 14 days.  She also takes pantoprazole 40 Mg daily and famotidine 20 Mg twice daily for GERD.  Has used nystatin suspension for thrush.  Current symptoms:  Current Outpatient Medications  Medication Sig Dispense Refill   acetaminophen (TYLENOL) 500 MG tablet Take 1-2 tablets (500-1,000 mg total) by mouth every 8 (eight) hours as needed for moderate pain. OTC 30 tablet 0   Calcium Carbonate-Vit D-Min (CALCIUM 1200 PO) Take 600 mg by mouth daily.      Cholecalciferol (VITAMIN D) 125 MCG (5000 UT) CAPS Take 1 capsule by mouth daily.     CINNAMON PO Take by mouth.     Cyanocobalamin (VITAMIN B-12) 1000 MCG SUBL Place 1,000 mcg under the tongue daily.      gabapentin (NEURONTIN) 100 MG capsule Take by mouth at bedtime. (Patient not taking: Reported on 10/22/2022)     ipratropium (ATROVENT) 0.06 % nasal spray Place 2 sprays into both nostrils 4 (four) times daily. 15 mL 12   levocetirizine (XYZAL) 5 MG tablet Take 1 tablet (5 mg total) by mouth every evening. 30 tablet 11   lidocaine (LIDODERM) 5 % Place 1 patch onto the skin daily. As needed for msk pain 30 patch 2   meloxicam (MOBIC) 7.5 MG tablet Take 7.5 mg by mouth daily.     nystatin (MYCOSTATIN)  100000 UNIT/ML suspension USE AS DIRECTED 5 MLS (500,000 UNITS TOTAL) IN THE MOUTH OR THROAT 4 (FOUR) TIMES DAILY. 120 mL 0   nystatin (MYCOSTATIN) 100000 UNIT/ML suspension Take 5 mLs (500,000 Units total) by mouth 4 (four) times daily. 60 mL 0   pantoprazole (PROTONIX) 40 MG tablet Take 1 tablet (40 mg total) by mouth daily as needed. For GERD/acid reflux 30 tablet 1   predniSONE (DELTASONE) 20 MG tablet 2 tabs poqday 1-3, 1 tabs poqday 4-6 (Patient not taking: Reported on 10/22/2022) 9 tablet 0   REFRESH CELLUVISC 1 % GEL      RESTASIS 0.05 % ophthalmic emulsion Instill 1 drop into both eyes twice a day     rosuvastatin (CRESTOR) 10 MG tablet Take 1 tablet (10 mg total) by mouth daily. (Patient not taking: Reported on 10/22/2022) 90 tablet 1   tiZANidine (ZANAFLEX) 4 MG tablet Take 1 tablet (4 mg total) by mouth every 8 (eight) hours as needed for muscle spasms (muscle tightness). (Patient not taking: Reported on 10/22/2022) 30 tablet 0   traMADol (ULTRAM) 50 MG tablet Take 1 tablet (50 mg total) by mouth every 12 (twelve) hours as needed. 10 tablet 0   TURMERIC PO Take by mouth.     TYRVAYA 0.03 MG/ACT SOLN      No current facility-administered medications for this visit.    Allergies as of 12/27/2022 - Review Complete 10/22/2022  Allergen Reaction Noted  Celecoxib Anxiety, Itching, and Other (See Comments) 07/11/2015   Flonase [fluticasone] Other (See Comments) 09/24/2022    Past Medical History:  Diagnosis Date   Adjustment disorder with mixed anxiety and depressed mood 10/11/2015   Allergy    Anemia    Arthritis    knees, hands   Borderline diabetes mellitus    Cholelithiasis    Complication of anesthesia    slow to wake   Dry eye syndrome of both eyes    Dysphagia 06/12/2020   GERD (gastroesophageal reflux disease)    Hypercholesterolemia    Loss of weight 12/17/2016   Pain in joint of left knee 12/14/2020   Palpitation 09/29/2019   Prehypertension 12/17/2016   Wears  dentures    full upper and lower    Past Surgical History:  Procedure Laterality Date   ABDOMINAL HYSTERECTOMY     complete   BIOPSY  10/22/2022   Procedure: BIOPSY;  Surgeon: Wyline Mood, MD;  Location: [facility] ENDOSCOPY;  Service: Gastroenterology;;   COLONOSCOPY WITH PROPOFOL N/A 05/09/2017   Procedure: COLONOSCOPY WITH PROPOFOL;  Surgeon: Midge Minium, MD;  Location: [facility]GERY [facility];  Service: Endoscopy;  Laterality: N/A;   ESOPHAGEAL BRUSHING  10/22/2022   Procedure: ESOPHAGEAL BRUSHING;  Surgeon: Wyline Mood, MD;  Location: [facility] ENDOSCOPY;  Service: Gastroenterology;;   ESOPHAGOGASTRODUODENOSCOPY (EGD) WITH PROPOFOL N/A 05/30/2020   Procedure: ESOPHAGOGASTRODUODENOSCOPY (EGD) WITH PROPOFOL;  Surgeon: Wyline Mood, MD;  Location: [facility] ENDOSCOPY;  Service: Gastroenterology;  Laterality: N/A;   ESOPHAGOGASTRODUODENOSCOPY (EGD) WITH PROPOFOL N/A 08/07/2020   Procedure: ESOPHAGOGASTRODUODENOSCOPY (EGD) WITH PROPOFOL;  Surgeon: Wyline Mood, MD;  Location: [facility] ENDOSCOPY;  Service: Gastroenterology;  Laterality: N/A;   ESOPHAGOGASTRODUODENOSCOPY (EGD) WITH PROPOFOL N/A 10/22/2022   Procedure: ESOPHAGOGASTRODUODENOSCOPY (EGD) WITH PROPOFOL;  Surgeon: Wyline Mood, MD;  Location: [facility] ENDOSCOPY;  Service: Gastroenterology;  Laterality: N/A;   EYE SURGERY  08/04/2017   KNEE SURGERY Bilateral     Review of Systems:    All systems reviewed and negative except where noted in HPI.   Physical Examination:   There were no vitals taken for this visit.  General: Well-nourished, well-developed in no acute distress.  Lungs: Clear to auscultation bilaterally. Non-labored. Heart: Regular rate and rhythm, no murmurs rubs or gallops.  Abdomen: Bowel sounds are normal; Abdomen is Soft; No hepatosplenomegaly, masses or hernias;  No Abdominal Tenderness; No guarding or rebound tenderness. Neuro: Alert and oriented x 3.  Grossly intact.  Psych: Alert and cooperative, normal mood and affect.   Imaging  Studies: No results found.  Assessment and Plan:   Ebony [name] is a 72 y.o. y/o female returns for follow-up of recurrent severe esophageal candidiasis seen on recent EGD.  Also has GERD and esophageal dysmotility.  She was treated with Diflucan 200 mg daily for 14 days.  Takes pantoprazole 40 daily and famotidine 20 Mg twice daily with benefit.  Symptoms currently improved.  1.  Recurrent esophageal candidiasis  Give more Diflucan if persistent sx.  2.  Esophageal dysmotility  I gave patient education handout from [facility] regarding esophageal dysmotility.  Lengthy discussion with patient and her daughter regarding education and treatment.  Recommend eat small frequent meals.  Eat sitting up.  Try Gaviscon 30 mL after each meal.   3.  GERD  Continue pantoprazole 40 Mg daily and famotidine 20 Mg twice daily.   Celso Amy, PA-C  Follow up ***  BP check ***

## 2022-12-31 ENCOUNTER — Emergency Department: Payer: Medicare HMO

## 2022-12-31 ENCOUNTER — Emergency Department
Admission: EM | Admit: 2022-12-31 | Discharge: 2022-12-31 | Disposition: A | Discharge status: Home / Self Care | Payer: Medicare HMO | Attending: Emergency Medicine | Admitting: Emergency Medicine

## 2022-12-31 ENCOUNTER — Encounter: Payer: Self-pay | Admitting: *Deleted

## 2022-12-31 ENCOUNTER — Other Ambulatory Visit: Payer: Self-pay

## 2022-12-31 DIAGNOSIS — I2693 Single subsegmental pulmonary embolism without acute cor pulmonale: Secondary | ICD-10-CM | POA: Insufficient documentation

## 2022-12-31 DIAGNOSIS — I2699 Other pulmonary embolism without acute cor pulmonale: Secondary | ICD-10-CM | POA: Diagnosis not present

## 2022-12-31 DIAGNOSIS — R0789 Other chest pain: Secondary | ICD-10-CM | POA: Diagnosis not present

## 2022-12-31 DIAGNOSIS — R7989 Other specified abnormal findings of blood chemistry: Secondary | ICD-10-CM | POA: Diagnosis not present

## 2022-12-31 DIAGNOSIS — R002 Palpitations: Secondary | ICD-10-CM | POA: Diagnosis not present

## 2022-12-31 DIAGNOSIS — R0602 Shortness of breath: Secondary | ICD-10-CM | POA: Diagnosis not present

## 2022-12-31 DIAGNOSIS — R918 Other nonspecific abnormal finding of lung field: Secondary | ICD-10-CM | POA: Diagnosis not present

## 2022-12-31 LAB — CBC
HCT: 40.8 % (ref 36.0–46.0)
Hemoglobin: 13.1 g/dL (ref 12.0–15.0)
MCH: 28.5 pg (ref 26.0–34.0)
MCHC: 32.1 g/dL (ref 30.0–36.0)
MCV: 88.9 fL (ref 80.0–100.0)
Platelets: 254 10*3/uL (ref 150–400)
RBC: 4.59 MIL/uL (ref 3.87–5.11)
RDW: 13.6 % (ref 11.5–15.5)
WBC: 4.2 10*3/uL (ref 4.0–10.5)
nRBC: 0 % (ref 0.0–0.2)

## 2022-12-31 LAB — HEPATIC FUNCTION PANEL
ALT: 13 U/L (ref 0–44)
AST: 19 U/L (ref 15–41)
Albumin: 3.3 g/dL — ABNORMAL LOW (ref 3.5–5.0)
Alkaline Phosphatase: 71 U/L (ref 38–126)
Bilirubin, Direct: <0.1 mg/dL (ref 0.0–0.2)
Total Bilirubin: 0.6 mg/dL (ref 0.3–1.2)
Total Protein: 6.2 g/dL — ABNORMAL LOW (ref 6.5–8.1)

## 2022-12-31 LAB — BASIC METABOLIC PANEL
Anion gap: 5 (ref 5–15)
BUN: 9 mg/dL (ref 8–23)
CO2: 27 mmol/L (ref 22–32)
Calcium: 8.6 mg/dL — ABNORMAL LOW (ref 8.9–10.3)
Chloride: 106 mmol/L (ref 98–111)
Creatinine, Ser: 0.61 mg/dL (ref 0.44–1.00)
GFR, Estimated: >60 mL/min (ref >60)
Glucose, Bld: 123 mg/dL — ABNORMAL HIGH (ref 70–99)
Potassium: 3.7 mmol/L (ref 3.5–5.1)
Sodium: 138 mmol/L (ref 135–145)

## 2022-12-31 LAB — MAGNESIUM: Magnesium: 2.3 mg/dL (ref 1.7–2.4)

## 2022-12-31 LAB — TROPONIN I (HIGH SENSITIVITY)
Troponin I (High Sensitivity): 3 ng/L (ref <18)
Troponin I (High Sensitivity): <2 ng/L (ref <18)

## 2022-12-31 LAB — D-DIMER, QUANTITATIVE: D-Dimer, Quant: 0.88 ug{FEU}/mL — ABNORMAL HIGH (ref 0.00–0.50)

## 2022-12-31 LAB — TSH: TSH: 1.049 u[IU]/mL (ref 0.350–4.500)

## 2022-12-31 MED ORDER — IOHEXOL 350 MG/ML SOLN
75.0000 mL | Freq: Once | INTRAVENOUS | Status: AC | PRN
  Administered 2022-12-31: 75 mL via INTRAVENOUS

## 2022-12-31 NOTE — Discharge Instructions (Signed)
You were seen in the ER today for your palpitations.  Your blood work was overall reassuring.  Your CT scan shows that you may have a very small blood clot in your lung.  We have decided to hold off on starting treatment, but it is very important that you follow-up with your primary care doctor closely to discuss if they do recommend initiating treatment and for further evaluation.  I have included a copy of the radiology report in your paperwork.  Return to the ER immediately for any new or worsening symptoms.

## 2022-12-31 NOTE — ED Triage Notes (Signed)
Pt to triage via wheelchair.  Pt reports heart racing and pounding for 2 weeks.  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  Pt alert.

## 2022-12-31 NOTE — ED Provider Notes (Signed)
[facility] Provider Note    Event Date/Time   First MD Initiated Contact with Patient 12/31/22 1809     (approximate)   History   Palpitations   HPI  Ebony [name] is a 72 y.o. female with history of hyperlipidemia, prediabetes presenting to the emergency department for evaluation of palpitations.  Patient reports that intermittently for the last several weeks she has had episodes of palpitations.  Additionally reports some indigestion sensation.  Otherwise denies chest pain.  No fevers or chills.  Denies difficulty breathing.  Has not seen a doctor for this previously.  She went to the fire station near her where she was told that she could potentially have an arrhythmia like atrial fibrillation, so she presented to the ER for further evaluation.  Denies known history of arrhythmias.      Physical Exam   Triage Vital Signs: ED Triage Vitals  Encounter Vitals Group     BP 12/31/22 1616 (!) 141/92     Systolic BP Percentile --      Diastolic BP Percentile --      Pulse Rate 12/31/22 1616 98     Resp 12/31/22 1616 20     Temp 12/31/22 1619 98 F (36.7 C)     Temp Source 12/31/22 1616 Oral     SpO2 12/31/22 1616 98 %     Weight 12/31/22 1617 119 lb 0.8 oz (54 kg)     Height 12/31/22 1617 5\' 7"  (1.702 m)     Head Circumference --      Peak Flow --      Pain Score 12/31/22 1617 0     Pain Loc --      Pain Education --      Exclude from Growth Chart --     Most recent vital signs: Vitals:   12/31/22 1800 12/31/22 2030  BP: 139/74 136/74  Pulse: 66 (!) 59  Resp: 16 (!) 21  Temp:  98.4 F (36.9 C)  SpO2: 100% 100%     General: Awake, interactive  CV:  Regular rate, good peripheral perfusion.  Resp:  Lungs clear, unlabored respirations.  Abd:  Soft, nondistended.  Neuro:  Symmetric facial movement, fluid speech   ED Results / Procedures / Treatments   Labs (all labs ordered are listed, but only abnormal results are displayed) Labs  Reviewed  BASIC METABOLIC PANEL - Abnormal; Notable for the following components:      Result Value   Glucose, Bld 123 (*)    Calcium 8.6 (*)    All other components within normal limits  HEPATIC FUNCTION PANEL - Abnormal; Notable for the following components:   Total Protein 6.2 (*)    Albumin 3.3 (*)    All other components within normal limits  D-DIMER, QUANTITATIVE - Abnormal; Notable for the following components:   D-Dimer, Quant 0.88 (*)    All other components within normal limits  CBC  MAGNESIUM  TSH  TROPONIN I (HIGH SENSITIVITY)  TROPONIN I (HIGH SENSITIVITY)     EKG EKG independently reviewed interpreted by myself (ER attending) demonstrates:  EKG demonstrates sinus rhythm at a rate of 89, PR 126, QRS 74, QTc 452, no acute ST changes  RADIOLOGY Imaging independently reviewed and interpreted by myself demonstrates:  CXR without focal consolidation CTA of the chest without obvious PE on my review.  However, I was contacted by radiology and he does note a possible very small PE over the right lung base  that he suspects is clinically insignificant  PROCEDURES:  Critical Care performed: No  Procedures   MEDICATIONS ORDERED IN ED: Medications  iohexol (OMNIPAQUE) 350 MG/ML injection 75 mL (75 mLs Intravenous Contrast Given 12/31/22 2037)     IMPRESSION / MDM / ASSESSMENT AND PLAN / ED COURSE  I reviewed the triage vital signs and the nursing notes.  Differential diagnosis includes, but is not limited to, arrhythmia, anemia, electrolyte abnormality, thyroid dysfunction, pulmonary embolism, ACS  Patient's presentation is most consistent with acute presentation with potential threat to life or bodily function.  72 year old presenting to the Emergency Department for evaluation of palpitations.  Vital signs stable on presentation.  EKG with sinus rhythm.  Lab work notable for elevated D-dimer at 0.88 for which CTA of the chest was ordered.  I was contacted by the  radiologist regarding this.  He does suspect that the patient has a very small PE over her right lung base.  He suspects that this is not likely clinically significant.  I did review these findings with the patient and her family.  She does meet Hestia criteria for outpatient management.  I did discuss starting anticoagulation from the ER versus deferring anticoagulation for now and having her follow-up with her primary care doctor to discuss risk first benefit given very small size of pulmonary embolism.  Patient would like to hold off on anticoagulation for now and follow-up with her primary care doctor which I do think is reasonable.  Strict return precautions provided.  Patient discharged in stable condition.  Clinical Course as of 12/31/22 2147  Tue Dec 31, 2022  1952 D-Dimer, Quant(!): 0.88 D-dimer elevated, will obtain CTA of the chest [NR]    Clinical Course User Index [NR] Trinna Post, MD     FINAL CLINICAL IMPRESSION(S) / ED DIAGNOSES   Final diagnoses:  Single subsegmental pulmonary embolism without acute cor pulmonale (HCC)  Palpitations     Rx / DC Orders   ED Discharge Orders     None        Note:  This document was prepared using Dragon voice recognition software and may include unintentional dictation errors.   Trinna Post, MD 12/31/22 2352

## 2023-01-01 ENCOUNTER — Ambulatory Visit (INDEPENDENT_AMBULATORY_CARE_PROVIDER_SITE_OTHER): Payer: Medicare HMO | Admitting: Family Medicine

## 2023-01-01 ENCOUNTER — Ambulatory Visit: Payer: Medicare HMO | Attending: Family Medicine

## 2023-01-01 ENCOUNTER — Encounter: Payer: Self-pay | Admitting: Family Medicine

## 2023-01-01 VITALS — BP 108/80 | HR 87 | Temp 97.7°F | Resp 16 | Ht 67.0 in | Wt 117.3 lb

## 2023-01-01 DIAGNOSIS — R634 Abnormal weight loss: Secondary | ICD-10-CM | POA: Diagnosis not present

## 2023-01-01 DIAGNOSIS — B3781 Candidal esophagitis: Secondary | ICD-10-CM

## 2023-01-01 DIAGNOSIS — R002 Palpitations: Secondary | ICD-10-CM

## 2023-01-01 DIAGNOSIS — R0602 Shortness of breath: Secondary | ICD-10-CM | POA: Diagnosis not present

## 2023-01-01 DIAGNOSIS — I2693 Single subsegmental pulmonary embolism without acute cor pulmonale: Secondary | ICD-10-CM

## 2023-01-01 DIAGNOSIS — J329 Chronic sinusitis, unspecified: Secondary | ICD-10-CM | POA: Diagnosis not present

## 2023-01-01 DIAGNOSIS — J029 Acute pharyngitis, unspecified: Secondary | ICD-10-CM

## 2023-01-01 DIAGNOSIS — Z09 Encounter for follow-up examination after completed treatment for conditions other than malignant neoplasm: Secondary | ICD-10-CM | POA: Diagnosis not present

## 2023-01-01 MED ORDER — IPRATROPIUM BROMIDE 0.03 % NA SOLN
2.0000 | Freq: Two times a day (BID) | NASAL | 12 refills | Status: DC
Start: 2023-01-01 — End: 2024-02-25

## 2023-01-01 NOTE — Patient Instructions (Signed)
I will consult with a few colleagues to see what they think about your CT scan and the need for anticoagulation and I will reach out to you.  You should get the heart monitor at home and we will follow up after that.

## 2023-01-01 NOTE — Progress Notes (Signed)
Name: Ebony [name]   MRN: [id number]    DOB: 1951-01-31   Date:01/01/2023       Progress Note  Chief Complaint  Patient presents with   ER Follow up    Palpitations, had an episode this morning     Subjective:   Ebony [name] is a 72 y.o. female, presents to clinic for ER f/up  ER visit palpitations, SOB, ECG unremarkable, labs reviewed, d-dimer positive so pt went for CTA - which showed RLL tiny filling defect pulm artery/tiny pulm embolism of doubtful clinical sig  Pt only had SOB with palpitation episodes, no pain with breathing, no SOB, no tachycardia  Her GI and throat hurting and uncontrolled pain with eating - losing weight, throat hurting She is using nystatin sometimes, it helps a little then she stops med and then it comes back Wt Readings from Last 20 Encounters:  01/01/23 117 lb 4.8 oz (53.2 kg)  12/31/22 119 lb 0.8 oz (54 kg)  10/22/22 119 lb (54 kg)  09/23/22 118 lb 12.8 oz (53.9 kg)  09/05/22 120 lb 6.4 oz (54.6 kg)  08/12/22 120 lb 6.4 oz (54.6 kg)  07/17/22 120 lb (54.4 kg)  06/28/22 120 lb (54.4 kg)  02/01/22 131 lb 12.8 oz (59.8 kg)  12/14/21 133 lb 4.8 oz (60.5 kg)  09/25/21 135 lb 9.6 oz (61.5 kg)  09/13/21 133 lb (60.3 kg)  09/08/21 132 lb (59.9 kg)  12/14/20 132 lb 3.2 oz (60 kg)  09/08/20 135 lb (61.2 kg)  08/07/20 135 lb (61.2 kg)  06/16/20 131 lb 11.2 oz (59.7 kg)  05/30/20 130 lb (59 kg)  05/02/20 133 lb 3.2 oz (60.4 kg)  03/17/20 137 lb (62.1 kg)   BMI Readings from Last 5 Encounters:  01/01/23 18.37 kg/m  12/31/22 18.65 kg/m  10/22/22 18.64 kg/m  09/23/22 18.61 kg/m  09/05/22 19.88 kg/m   Nasal congestion bad since stopping flonase     Current Outpatient Medications:    acetaminophen (TYLENOL) 500 MG tablet, Take 1-2 tablets (500-1,000 mg total) by mouth every 8 (eight) hours as needed for moderate pain. OTC, Disp: 30 tablet, Rfl: 0   Calcium Carbonate-Vit D-Min (CALCIUM 1200 PO), Take 600 mg by mouth daily. , Disp: , Rfl:     Cholecalciferol (VITAMIN D) 125 MCG (5000 UT) CAPS, Take 1 capsule by mouth daily., Disp: , Rfl:    CINNAMON PO, Take by mouth., Disp: , Rfl:    Cyanocobalamin (VITAMIN B-12) 1000 MCG SUBL, Place 1,000 mcg under the tongue daily. , Disp: , Rfl:    ipratropium (ATROVENT) 0.06 % nasal spray, Place 2 sprays into both nostrils 4 (four) times daily., Disp: 15 mL, Rfl: 12   levocetirizine (XYZAL) 5 MG tablet, Take 1 tablet (5 mg total) by mouth every evening., Disp: 30 tablet, Rfl: 11   lidocaine (LIDODERM) 5 %, Place 1 patch onto the skin daily. As needed for msk pain, Disp: 30 patch, Rfl: 2   meloxicam (MOBIC) 7.5 MG tablet, Take 7.5 mg by mouth daily., Disp: , Rfl:    nystatin (MYCOSTATIN) 100000 UNIT/ML suspension, USE AS DIRECTED 5 MLS (500,000 UNITS TOTAL) IN THE MOUTH OR THROAT 4 (FOUR) TIMES DAILY., Disp: 120 mL, Rfl: 0   nystatin (MYCOSTATIN) 100000 UNIT/ML suspension, Take 5 mLs (500,000 Units total) by mouth 4 (four) times daily., Disp: 60 mL, Rfl: 0   pantoprazole (PROTONIX) 40 MG tablet, Take 1 tablet (40 mg total) by mouth daily as needed. For GERD/acid reflux,  Disp: 30 tablet, Rfl: 1   REFRESH CELLUVISC 1 % GEL, , Disp: , Rfl:    RESTASIS 0.05 % ophthalmic emulsion, Instill 1 drop into both eyes twice a day, Disp: , Rfl:    tiZANidine (ZANAFLEX) 4 MG tablet, Take 1 tablet (4 mg total) by mouth every 8 (eight) hours as needed for muscle spasms (muscle tightness)., Disp: 30 tablet, Rfl: 0   traMADol (ULTRAM) 50 MG tablet, Take 1 tablet (50 mg total) by mouth every 12 (twelve) hours as needed., Disp: 10 tablet, Rfl: 0   TURMERIC PO, Take by mouth., Disp: , Rfl:    TYRVAYA 0.03 MG/ACT SOLN, , Disp: , Rfl:    gabapentin (NEURONTIN) 100 MG capsule, Take by mouth at bedtime. (Patient not taking: Reported on 10/22/2022), Disp: , Rfl:    predniSONE (DELTASONE) 20 MG tablet, 2 tabs poqday 1-3, 1 tabs poqday 4-6 (Patient not taking: Reported on 10/22/2022), Disp: 9 tablet, Rfl: 0   rosuvastatin  (CRESTOR) 10 MG tablet, Take 1 tablet (10 mg total) by mouth daily. (Patient not taking: Reported on 10/22/2022), Disp: 90 tablet, Rfl: 1  Patient Active Problem List   Diagnosis Date Noted   Seasonal allergic rhinitis 08/13/2022   Candida infection, esophageal (HCC) 08/13/2022   Arthritis of left knee 12/14/2020   Lumbar radiculopathy 12/14/2020   Lumbar spondylosis 12/14/2020   Dysphagia 06/12/2020   Gastroesophageal reflux disease 05/02/2020   B12 deficiency 12/30/2018   Hyperlipidemia 07/01/2018   Prediabetes 07/01/2018   Neutropenia, unspecified type (HCC) 12/18/2016   Arthritis of knee, degenerative 07/18/2015    Past Surgical History:  Procedure Laterality Date   ABDOMINAL HYSTERECTOMY     complete   BIOPSY  10/22/2022   Procedure: BIOPSY;  Surgeon: Wyline Mood, MD;  Location: [facility] ENDOSCOPY;  Service: Gastroenterology;;   COLONOSCOPY WITH PROPOFOL N/A 05/09/2017   Procedure: COLONOSCOPY WITH PROPOFOL;  Surgeon: Midge Minium, MD;  Location: [facility] SURGERY CNTR;  Service: Endoscopy;  Laterality: N/A;   ESOPHAGEAL BRUSHING  10/22/2022   Procedure: ESOPHAGEAL BRUSHING;  Surgeon: Wyline Mood, MD;  Location: [facility] ENDOSCOPY;  Service: Gastroenterology;;   ESOPHAGOGASTRODUODENOSCOPY (EGD) WITH PROPOFOL N/A 05/30/2020   Procedure: ESOPHAGOGASTRODUODENOSCOPY (EGD) WITH PROPOFOL;  Surgeon: Wyline Mood, MD;  Location: [facility] ENDOSCOPY;  Service: Gastroenterology;  Laterality: N/A;   ESOPHAGOGASTRODUODENOSCOPY (EGD) WITH PROPOFOL N/A 08/07/2020   Procedure: ESOPHAGOGASTRODUODENOSCOPY (EGD) WITH PROPOFOL;  Surgeon: Wyline Mood, MD;  Location: [facility] ENDOSCOPY;  Service: Gastroenterology;  Laterality: N/A;   ESOPHAGOGASTRODUODENOSCOPY (EGD) WITH PROPOFOL N/A 10/22/2022   Procedure: ESOPHAGOGASTRODUODENOSCOPY (EGD) WITH PROPOFOL;  Surgeon: Wyline Mood, MD;  Location: [facility] ENDOSCOPY;  Service: Gastroenterology;  Laterality: N/A;   EYE SURGERY  08/04/2017   KNEE SURGERY Bilateral     Family History   Problem Relation Age of Onset   Hypertension Mother    Stroke Mother        4 mini strokes   Thyroid disease Mother    Kidney failure Father    Diabetes Father    Hypertension Father    Kidney disease Father    Emphysema Maternal Grandmother    Diabetes Maternal Grandmother    Hypertension Maternal Grandmother    Diabetes Maternal Grandfather    Alcohol abuse Maternal Grandfather    Diabetes Paternal Grandmother    Heart attack Paternal Grandmother    Emphysema Paternal Grandfather    Diabetes Paternal Grandfather    Heart disease Paternal Grandfather    Thyroid disease Sister    Vitamin D deficiency Sister  Arthritis Sister    COPD Sister    Fibromyalgia Sister    Hypertension Sister    Hyperlipidemia Sister    Asthma Sister    Thyroid disease Sister    Breast cancer Other    Cancer Neg Hx     Social History   Tobacco Use   Smoking status: Never   Smokeless tobacco: Never  Vaping Use   Vaping status: Never Used  Substance Use Topics   Alcohol use: No   Drug use: No     Allergies  Allergen Reactions   Celecoxib Anxiety, Itching and Other (See Comments)   Flonase [Fluticasone] Other (See Comments)    Candidal infection when she uses steroids - severe to throat and esophagus - avoid intranasal steroid sprays     Health Maintenance  Topic Date Due   DTaP/Tdap/Td (1 - Tdap) Never done   Pneumonia Vaccine 29+ Years old (1 of 1 - PCV) Never done   MAMMOGRAM  02/22/2021   DEXA SCAN  02/22/2022   INFLUENZA VACCINE  12/05/2022   COVID-19 Vaccine (5 - 2023-24 season) 01/17/2023 (Originally 05/02/2022)   Zoster Vaccines- Shingrix (1 of 2) 04/03/2023 (Originally 09/19/1999)   Medicare Annual Wellness (AWV)  06/29/2023   Colonoscopy  05/10/2027   Hepatitis C Screening  Completed   HPV VACCINES  Aged Out    Chart Review Today: I personally reviewed active problem list, medication list, allergies, family history, social history, health maintenance, notes from  last encounter, lab results, imaging with the patient/caregiver today.   Review of Systems  Constitutional: Negative.   HENT: Negative.    Eyes: Negative.   Respiratory: Negative.  Negative for cough, choking, chest tightness, shortness of breath and wheezing.   Cardiovascular:  Positive for palpitations. Negative for chest pain and leg swelling.  Gastrointestinal: Negative.   Endocrine: Negative.   Genitourinary: Negative.   Musculoskeletal: Negative.   Skin: Negative.   Allergic/Immunologic: Negative.   Neurological: Negative.   Hematological: Negative.   Psychiatric/Behavioral: Negative.    All other systems reviewed and are negative.    Objective:   Vitals:   01/01/23 0909  BP: 108/80  Pulse: 87  Resp: 16  Temp: 97.7 F (36.5 C)  TempSrc: Oral  SpO2: 97%  Weight: 117 lb 4.8 oz (53.2 kg)  Height: 5\' 7"  (1.702 m)    Body mass index is 18.37 kg/m.  Physical Exam Vitals and nursing note reviewed.  Constitutional:      General: She is not in acute distress.    Appearance: Normal appearance. She is well-developed. She is not ill-appearing, toxic-appearing or diaphoretic.  HENT:     Head: Normocephalic and atraumatic.     Nose: Nose normal.     Right Turbinates: Enlarged, swollen and pale.     Left Turbinates: Enlarged, swollen and pale.     Mouth/Throat:     Mouth: Mucous membranes are moist.     Pharynx: Posterior oropharyngeal erythema present.  Eyes:     General: No scleral icterus.       Right eye: No discharge.        Left eye: No discharge.     Conjunctiva/sclera: Conjunctivae normal.  Neck:     Trachea: No tracheal deviation.  Cardiovascular:     Rate and Rhythm: Normal rate and regular rhythm.     Pulses: Normal pulses.     Heart sounds: Normal heart sounds. No murmur heard.    No friction rub. No gallop.  Pulmonary:  Effort: Pulmonary effort is normal. No respiratory distress.     Breath sounds: No stridor.  Abdominal:     General: Bowel  sounds are normal.     Palpations: Abdomen is soft.  Skin:    General: Skin is warm and dry.     Findings: No rash.  Neurological:     Mental Status: She is alert. Mental status is at baseline.     Motor: No abnormal muscle tone.     Coordination: Coordination normal.     Gait: Gait normal.  Psychiatric:        Mood and Affect: Mood normal.        Behavior: Behavior normal.         Assessment & Plan:     ICD-10-CM   1. Palpitations  R00.2 LONG TERM MONITOR (3-14 DAYS)   labs and ECG reviewed from ER, HR RRR today, heart monitor discussed and ordered    2. Shortness of breath  R06.02    only occured with some palpitations, no SOB today    3. Single subsegmental pulmonary embolism without acute cor pulmonale (HCC)  I26.93    tiny filling defect, no current CP, SOB, tachycardia, DOE - consulting with SP/MDs in office for opinion on scan and tx options  -option to do NOAC/DOAC x 3 months and recheck CTA -no tx and watch sx -if any sx start or worsen, reevaluate and then start xarelto  Do need to also do risk/benefit assessment esp with recent GI hx, though no hx of ulcer or GI bleed On exam today lungs CTA A&P, heart RRR, VSS  Sx to watch for - pain with breathing, SOB, tachycardia, exertional sx - none sx currently Tiny peripheral subsegmental solitaty embolus in medial right lung base still seems like she is not having clinical sx of and she may choose not to treat  I did explain that I would consult some MD's and get back to her with additional recommendations - msgs sent to Dr. Carlynn Purl, Dr. Caralee Ates and other colleagues     4. Rhinosinusitis  J32.9 ipratropium (ATROVENT) 0.03 % nasal spray   enlarged nasal turbinates    5. Sore throat  J02.9    likely related to esophageal candidal infection/recurrence - reached out to GI for tx and f/up plan    6. 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 Encounter for examination following  treatment at hospital  Z09    ED visit, results, ECG, documentation CT all reviewed with pt and family present today    8. Weight loss  R63.4    lost 3 lbs, low BMI, discussed additional calories/boost/ensure, tx GI sx and f/up with GI         Consult with other providers for NOAC tx with scans Reach out to GI with plan for worse sx given recent EGD Holter monitor   Return for f/up in 3-4 weeks after holter - palpitations.   Danelle Berry, PA-C 01/01/23 9:39 AM

## 2023-01-02 ENCOUNTER — Other Ambulatory Visit: Payer: Self-pay | Admitting: Family Medicine

## 2023-01-05 DIAGNOSIS — R002 Palpitations: Secondary | ICD-10-CM

## 2023-01-07 DIAGNOSIS — M1712 Unilateral primary osteoarthritis, left knee: Secondary | ICD-10-CM | POA: Insufficient documentation

## 2023-01-07 HISTORY — DX: Unilateral primary osteoarthritis, left knee: M17.12

## 2023-01-14 ENCOUNTER — Telehealth: Payer: Self-pay

## 2023-01-14 ENCOUNTER — Telehealth: Payer: Self-pay | Admitting: Nurse Practitioner

## 2023-01-14 DIAGNOSIS — F4329 Adjustment disorder with other symptoms: Secondary | ICD-10-CM

## 2023-01-14 NOTE — Telephone Encounter (Signed)
Transition Care Management Unsuccessful Follow-up Telephone Call  Date of discharge and from where:  12/31/2022 [facility]  Attempts:  1st Attempt  Reason for unsuccessful TCM follow-up call:  No answer/busy  Nicklas Mcsweeney [facility]  [facility], [facility] Guide Direct Dial: [phone]  Website: [email or web address]

## 2023-01-14 NOTE — Telephone Encounter (Signed)
Daughter Sherri called.  She states when they were there last week, the pt told the CMA she is depressed.  Sherri would like to know if Raynelle Fanning could reach out to pt and suggest seeing a therapist. Sherri fees like if it comes from the dr, pt would be more receptive.  Sherri states pt has been dealing w/ several issues, like her husband has had 2 strokes.  And they have dr appts all the time.  She is just running herself very tired. Sherri states for her mom to actually say she is depressed, that is big.   Please advise.  Call if you need to speak to Sherri [phone]   Pt has appt

## 2023-01-15 ENCOUNTER — Telehealth: Payer: Self-pay

## 2023-01-15 NOTE — Telephone Encounter (Signed)
Transition Care Management Unsuccessful Follow-up Telephone Call  Date of discharge and from where:  12/31/2022 [facility]  Attempts:  2nd Attempt  Reason for unsuccessful TCM follow-up call:  Unable to leave message  Dorla Guizar [facility]  [facility], [facility] Guide Direct Dial: [phone]  Website: [email or web address]

## 2023-01-15 NOTE — Telephone Encounter (Signed)
Spoke to daughter she stated her mom is exhausted, tired. Think her stressed level is maxed out trying to care for everyone else. Would like you to recommend therapy for her mom. She stated at last visit she was depressed.

## 2023-01-20 ENCOUNTER — Telehealth: Payer: Self-pay

## 2023-01-20 ENCOUNTER — Telehealth: Payer: Self-pay | Admitting: Gastroenterology

## 2023-01-20 MED ORDER — FLUCONAZOLE 200 MG PO TABS: 200.0000 mg | ORAL_TABLET | Freq: Every day | ORAL | 0 refills | Status: AC

## 2023-01-20 NOTE — Telephone Encounter (Signed)
Made patient a appointment and documented call on other telephone call

## 2023-01-20 NOTE — Telephone Encounter (Signed)
Return patient call and informed patient this information and made appointment for 03/17/2023 with Inetta Fermo and sent medication to the pharmacy

## 2023-01-20 NOTE — Telephone Encounter (Signed)
-----   Message from Wyline Mood sent at 01/02/2023  9:59 AM EDT ----- Regarding: RE: GERD/throat sx Looks like the candida is recurring   I believe she is on long term steroids?  Long term management for candida is to rinse mouth after any steroid use, sit upright after meals, PPI.   Not much more we can do than that   Wont be telling her anything different than this-lets see how she does after second course of diflucan .   Kandis Cocking can we schedule OP visit with me or Inetta Fermo in 8 weeks.  Kandis Cocking can we give another course of diflucan - reorder same as last script , in 8 weeks she would have had another round of diflucan - can you also confirm she did take 1 round of diflucan and if it helped after that ?  Kiran ----- Message ----- From: Danelle Berry, PA-C Sent: 01/01/2023   5:22 PM EDT To: Wyline Mood, MD Subject: GERD/throat sx                                 Dr. Tobi Bastos Mrs Feagans came in today and is still having terrible throat pain and pain with swallowing.  It will improve a little with nystatin and then she stops it and sx return. We there anything else she was supposed to do if symptoms continue or persist?  I encouraged her to f/up with you since I had tried all I could and I wasn't sure what the long term plan or management would be, and I saw you just redid an EGD.  Thanks, LT

## 2023-01-20 NOTE — Telephone Encounter (Signed)
Patient called in because she was returning a call that she have received from the nurse.

## 2023-01-23 ENCOUNTER — Ambulatory Visit: Payer: Medicare HMO | Admitting: Nurse Practitioner

## 2023-02-12 ENCOUNTER — Other Ambulatory Visit: Payer: Self-pay | Admitting: Family Medicine

## 2023-02-12 DIAGNOSIS — E782 Mixed hyperlipidemia: Secondary | ICD-10-CM

## 2023-02-17 ENCOUNTER — Other Ambulatory Visit: Payer: Self-pay | Admitting: Family Medicine

## 2023-02-17 DIAGNOSIS — I471 Supraventricular tachycardia, unspecified: Secondary | ICD-10-CM

## 2023-02-17 DIAGNOSIS — R002 Palpitations: Secondary | ICD-10-CM

## 2023-02-17 MED ORDER — METOPROLOL SUCCINATE ER 25 MG PO TB24
12.5000 mg | ORAL_TABLET | Freq: Every day | ORAL | 0 refills | Status: DC
Start: 2023-02-17 — End: 2023-03-14

## 2023-02-19 ENCOUNTER — Ambulatory Visit (INDEPENDENT_AMBULATORY_CARE_PROVIDER_SITE_OTHER): Payer: Medicare HMO | Admitting: Physician Assistant

## 2023-02-19 ENCOUNTER — Ambulatory Visit: Payer: Medicare HMO | Admitting: Family Medicine

## 2023-02-19 ENCOUNTER — Encounter: Payer: Self-pay | Admitting: Physician Assistant

## 2023-02-19 VITALS — BP 130/74 | HR 89 | Temp 97.7°F | Resp 16 | Ht 67.0 in | Wt 123.8 lb

## 2023-02-19 DIAGNOSIS — I471 Supraventricular tachycardia, unspecified: Secondary | ICD-10-CM | POA: Diagnosis not present

## 2023-02-19 DIAGNOSIS — Z780 Asymptomatic menopausal state: Secondary | ICD-10-CM | POA: Diagnosis not present

## 2023-02-19 DIAGNOSIS — R002 Palpitations: Secondary | ICD-10-CM | POA: Diagnosis not present

## 2023-02-19 NOTE — Progress Notes (Signed)
Acute Office Visit   Patient: Ebony [name]   DOB: 01-02-1951   72 y.o. Female  MRN: [id number] Visit Date: 02/19/2023  Today's healthcare provider: Oswaldo Conroy Malik Ruffino, PA-C  Introduced myself to the patient as a Secondary school teacher and provided education on APPs in clinical practice.    Chief Complaint  Patient presents with   Palpitations    Are better but still come and go   Subjective    HPI HPI     Palpitations    Additional comments: Are better but still come and go      Last edited by Forde Radon, CMA on 02/19/2023 10:48 AM.      Palpitations  She completed long term monitor and was found to have hundreds of episodes of SVTs  PCP started metoprolol 12.5 mg PO every day to assist with HR and Cardiology referral has been placed  She has not picked up the Metoprolol yet  She reports most instances of palpitations are lasting only a few seconds She denies chest pains or dizziness, LOC  She reports she is still having the palpitations- thinks they have become less frequent and strong but she had two prior to apt starting    Medications: Outpatient Medications Prior to Visit  Medication Sig   acetaminophen (TYLENOL) 500 MG tablet Take 1-2 tablets (500-1,000 mg total) by mouth every 8 (eight) hours as needed for moderate pain. OTC   Calcium Carbonate-Vit D-Min (CALCIUM 1200 PO) Take 600 mg by mouth daily.    Cholecalciferol (VITAMIN D) 125 MCG (5000 UT) CAPS Take 1 capsule by mouth daily.   CINNAMON PO Take by mouth.   Cyanocobalamin (VITAMIN B-12) 1000 MCG SUBL Place 1,000 mcg under the tongue daily.    ipratropium (ATROVENT) 0.03 % nasal spray Place 2 sprays into both nostrils every 12 (twelve) hours.   levocetirizine (XYZAL) 5 MG tablet Take 1 tablet (5 mg total) by mouth every evening.   lidocaine (LIDODERM) 5 % Place 1 patch onto the skin daily. As needed for msk pain   meloxicam (MOBIC) 7.5 MG tablet Take 7.5 mg by mouth daily.   metoprolol succinate  (TOPROL XL) 25 MG 24 hr tablet Take 0.5 tablets (12.5 mg total) by mouth daily with breakfast.   nystatin (MYCOSTATIN) 100000 UNIT/ML suspension Take 5 mLs (500,000 Units total) by mouth 4 (four) times daily.   pantoprazole (PROTONIX) 40 MG tablet Take 1 tablet (40 mg total) by mouth daily as needed. For GERD/acid reflux   REFRESH CELLUVISC 1 % GEL    RESTASIS 0.05 % ophthalmic emulsion Instill 1 drop into both eyes twice a day   rosuvastatin (CRESTOR) 10 MG tablet TAKE 1 TABLET EVERY DAY (DOSE CHANGE)   tiZANidine (ZANAFLEX) 4 MG tablet Take 1 tablet (4 mg total) by mouth every 8 (eight) hours as needed for muscle spasms (muscle tightness).   traMADol (ULTRAM) 50 MG tablet Take 1 tablet (50 mg total) by mouth every 12 (twelve) hours as needed.   TURMERIC PO Take by mouth.   TYRVAYA 0.03 MG/ACT SOLN    No facility-administered medications prior to visit.    Review of Systems  Cardiovascular:  Positive for palpitations. Negative for chest pain and leg swelling.        Objective    BP 130/74   Pulse 89   Temp 97.7 F (36.5 C) (Oral)   Resp 16   Ht 5\' 7"  (1.702 m)   Wt 123  lb 12.8 oz (56.2 kg)   SpO2 98%   BMI 19.39 kg/m     Physical Exam Vitals reviewed.  Constitutional:      General: She is awake.     Appearance: Normal appearance. She is well-developed and well-groomed.  HENT:     Head: Normocephalic and atraumatic.  Eyes:     Extraocular Movements: Extraocular movements intact.     Conjunctiva/sclera: Conjunctivae normal.     Pupils: Pupils are equal, round, and reactive to light.  Cardiovascular:     Rate and Rhythm: Normal rate and regular rhythm.     Pulses: Normal pulses.     Heart sounds: Normal heart sounds. No murmur heard.    No friction rub. No gallop.  Pulmonary:     Effort: Pulmonary effort is normal.     Breath sounds: Normal breath sounds.  Skin:    General: Skin is warm and dry.  Neurological:     General: No focal deficit present.     Mental  Status: She is alert and oriented to person, place, and time. Mental status is at baseline.  Psychiatric:        Mood and Affect: Mood normal.        Behavior: Behavior normal. Behavior is cooperative.        Thought Content: Thought content normal.        Judgment: Judgment normal.        No results found for any visits on 02/19/23.  Assessment & Plan      No follow-ups on file.       Problem List Items Addressed This Visit       Cardiovascular and Mediastinum   Paroxysmal SVT (supraventricular tachycardia) (HCC) - Primary    Unsure of chronicity, ongoing Patient presented several weeks ago with concerns for palpitations Her PCP advised for her to wear long-term monitor which demonstrated significant quantity of SVT episodes Her PCP has since sent in a prescription for metoprolol 12.5 mg p.o. daily.  Patient has not yet picked this up from the pharmacy but was instructed to do so and start as soon as possible She has cardiology referral placed from PCP for further evaluation A significant portion of the appointment today was spent on patient education, answering questions regarding SVT, palpitations, ways to manage this, mechanism of beta-blocker action Reassurance was provided for patient as well as emergency room precautions Will follow-up after cardiology appointment for next steps in management plan or sooner if concerns arise At least 30 minutes of appointment was spent providing patient education regarding her results and answering questions      Other Visit Diagnoses     Postmenopausal estrogen deficiency       Relevant Orders   DG Bone Density   Palpitations            No follow-ups on file.   I, Wiliam Cauthorn E Minette Manders, PA-C, have reviewed all documentation for this visit. The documentation on 02/19/23 for the exam, diagnosis, procedures, and orders are all accurate and complete.   Jacquelin Hawking, MHS, PA-C [facility]

## 2023-02-19 NOTE — Patient Instructions (Addendum)
For your nasal symptoms, you can use antihistamine nasal sprays such as Azelastine to help with relief You can also use sterile saline flushes or sprays to moisturize and flush the nasal passages Please start the Metoprolol- take a half tablet to start off  You should receive a call to schedule your cardiology apt soon so keep an eye out for that.    Please call to schedule your mammogram and/or bone density: [facility]  Address: [address], [place], [place] [address] Phone: [phone]  [facility] [address] [place],  [place]  [address] Phone: [phone]

## 2023-02-19 NOTE — Assessment & Plan Note (Signed)
Unsure of chronicity, ongoing Patient presented several weeks ago with concerns for palpitations Her PCP advised for her to wear long-term monitor which demonstrated significant quantity of SVT episodes Her PCP has since sent in a prescription for metoprolol 12.5 mg p.o. daily.  Patient has not yet picked this up from the pharmacy but was instructed to do so and start as soon as possible She has cardiology referral placed from PCP for further evaluation A significant portion of the appointment today was spent on patient education, answering questions regarding SVT, palpitations, ways to manage this, mechanism of beta-blocker action Reassurance was provided for patient as well as emergency room precautions Will follow-up after cardiology appointment for next steps in management plan or sooner if concerns arise At least 30 minutes of appointment was spent providing patient education regarding her results and answering questions

## 2023-03-03 DIAGNOSIS — M1711 Unilateral primary osteoarthritis, right knee: Secondary | ICD-10-CM | POA: Diagnosis not present

## 2023-03-12 ENCOUNTER — Encounter: Payer: Self-pay | Admitting: Physician Assistant

## 2023-03-14 ENCOUNTER — Encounter: Payer: Self-pay | Admitting: Cardiovascular Disease

## 2023-03-14 ENCOUNTER — Ambulatory Visit: Payer: Medicare HMO | Attending: Cardiovascular Disease | Admitting: Cardiovascular Disease

## 2023-03-14 ENCOUNTER — Telehealth: Payer: Self-pay | Admitting: Emergency Medicine

## 2023-03-14 VITALS — BP 114/78 | HR 72 | Ht 67.0 in | Wt 121.6 lb

## 2023-03-14 DIAGNOSIS — E782 Mixed hyperlipidemia: Secondary | ICD-10-CM

## 2023-03-14 DIAGNOSIS — I700 Atherosclerosis of aorta: Secondary | ICD-10-CM

## 2023-03-14 DIAGNOSIS — I471 Supraventricular tachycardia, unspecified: Secondary | ICD-10-CM | POA: Diagnosis not present

## 2023-03-14 DIAGNOSIS — R002 Palpitations: Secondary | ICD-10-CM

## 2023-03-14 MED ORDER — METOPROLOL SUCCINATE ER 25 MG PO TB24
25.0000 mg | ORAL_TABLET | Freq: Every day | ORAL | 3 refills | Status: DC
Start: 2023-03-14 — End: 2023-12-26

## 2023-03-14 NOTE — Patient Instructions (Signed)
Medication Instructions:  Increase Toprol XL to 25 mg daily *If you need a refill on your cardiac medications before your next appointment, please call your pharmacy*  Testing/Procedures: You can look into the [facility] device by [employer]. This device is purchased by you and it connects to an application you download to your smart phone.  It can detect abnormal heart rhythms and alert you to contact your doctor for further evaluation. The web site is:  [email or web address]     Follow-Up: At [facility], you and your health needs are our priority.  As part of our continuing mission to provide you with exceptional heart care, we have created designated Provider Care Teams.  These Care Teams include your primary Cardiologist (physician) and Advanced Practice Providers (APPs -  Physician Assistants and Nurse Practitioners) who all work together to provide you with the care you need, when you need it.  We recommend signing up for the patient portal called "MyChart".  Sign up information is provided on this After Visit Summary.  MyChart is used to connect with patients for Virtual Visits (Telemedicine).  Patients are able to view lab/test results, encounter notes, upcoming appointments, etc.  Non-urgent messages can be sent to your provider as well.   To learn more about what you can do with MyChart, go to [email or web address].    Your next appointment:    Pharm D- in a few weeks- for Lipids   Dr Royann Shivers in one year

## 2023-03-14 NOTE — Telephone Encounter (Signed)
Dr Royann Shivers I put an order in for a coronary calcium score. I did not discuss this with her in clinic, but please tell her that it will help me guide her cholesterol treatment. She may prefer to have it done in [place].   Called and went over the information above with the patient. She verbalized understanding. Told her that a scheduler will reach out to her to get this scheduled. She does prefer to have this done in [place] if possible.

## 2023-03-14 NOTE — Progress Notes (Signed)
Cardiology Office Note:    Date:  03/14/2023   ID:  ATTISON GIRON, DOB [birth year], MRN [id number]  PCP:  Danelle Berry, PA-C   [facility] Providers Cardiologist:  None     Referring MD: Danelle Berry, PA-C   No chief complaint on file. Ebony [name] is a 72 y.o. female who is being seen today for the evaluation of palpitations at the request of Danelle Berry, PA-C.   History of Present Illness:    Ebony [name] is a 72 y.o. female with a hx of possible subsegmental pulmonary embolism (August 2024), hyperlipidemia, prediabetes, cholelithiasis here for evaluation of SVT.    She has had palpitations off and on for several years, but they were particularly troublesome in the last few weeks.  The palpitations are particularly frequent if she is emotional or excited or working hard.  The palpitations are described as an unpleasant fluttering in her chest that is always brief.  It is not associate dizziness or near syncope or chest pain.  She has a very brief sensation of needing to take a deep breath but does not have true dyspnea.  She is physically active, taking care of all the household chores and taking care of her husband who has had 2 strokes.  She denies angina or dyspnea at rest or with activity.  Following the monitor she was started on metoprolol succinate.  This has led to an improvement in the burden of palpitations.  An event monitor in September 2024 showed a high burden of premature atrial beats (7% of all beats) and 100s of episodes of brief nonsustained supraventricular tachycardia (longest only 14.3 seconds).  No true atrial fibrillation was seen.  A previous echocardiogram in 2020 performed for "chest pain" showed borderline LVEF 50-55% but was otherwise a normal test (she tells me that the echo was ordered for the same complaint that she is having now).  A CT of the chest performed 12/31/2022 showed mild atherosclerotic plaque in the aorta, which was however normal  in caliber.  There was a tiny subsegmental pulmonary embolism of doubtful clinical significance.  The thyroid was heterogeneous.  No major lung abnormalities were seen.  TSH was normal, as were the electrolytes.  Despite the fact that she is very lean she has prediabetes with a hemoglobin A1c that was 6.3% earlier this year.  She has significant hypercholesterolemia with a total cholesterol of 268 and LDL of 168 on labs performed in April 2024.  She has an excellent HDL at 82.  She has been prescribed rosuvastatin 10 mg daily (in the past has been on doses varying between 5-20 mg).  She has stopped taking the rosuvastatin because every time she takes that she notices a decline in her cognitive function.  She begins to forget what she was doing and feels foggy.  She had similar complaints with pravastatin in the past, although they were not as prominent.  Past Medical History:  Diagnosis Date   Adjustment disorder with mixed anxiety and depressed mood 10/11/2015   Allergy    Anemia    Arthritis    knees, hands   Borderline diabetes mellitus    Cholelithiasis    Complication of anesthesia    slow to wake   Dry eye syndrome of both eyes    Dysphagia 06/12/2020   GERD (gastroesophageal reflux disease)    Hypercholesterolemia    Loss of weight 12/17/2016   Pain in joint of left knee 12/14/2020   Palpitation  09/29/2019   Prehypertension 12/17/2016   Wears dentures    full upper and lower    Past Surgical History:  Procedure Laterality Date   ABDOMINAL HYSTERECTOMY     complete   BIOPSY  10/22/2022   Procedure: BIOPSY;  Surgeon: Wyline Mood, MD;  Location: [facility] ENDOSCOPY;  Service: Gastroenterology;;   COLONOSCOPY WITH PROPOFOL N/A 05/09/2017   Procedure: COLONOSCOPY WITH PROPOFOL;  Surgeon: Midge Minium, MD;  Location: [facility];  Service: Endoscopy;  Laterality: N/A;   ESOPHAGEAL BRUSHING  10/22/2022   Procedure: ESOPHAGEAL BRUSHING;  Surgeon: Wyline Mood, MD;  Location: [facility]  ENDOSCOPY;  Service: Gastroenterology;;   ESOPHAGOGASTRODUODENOSCOPY (EGD) WITH PROPOFOL N/A 05/30/2020   Procedure: ESOPHAGOGASTRODUODENOSCOPY (EGD) WITH PROPOFOL;  Surgeon: Wyline Mood, MD;  Location: [facility] ENDOSCOPY;  Service: Gastroenterology;  Laterality: N/A;   ESOPHAGOGASTRODUODENOSCOPY (EGD) WITH PROPOFOL N/A 08/07/2020   Procedure: ESOPHAGOGASTRODUODENOSCOPY (EGD) WITH PROPOFOL;  Surgeon: Wyline Mood, MD;  Location: [facility] ENDOSCOPY;  Service: Gastroenterology;  Laterality: N/A;   ESOPHAGOGASTRODUODENOSCOPY (EGD) WITH PROPOFOL N/A 10/22/2022   Procedure: ESOPHAGOGASTRODUODENOSCOPY (EGD) WITH PROPOFOL;  Surgeon: Wyline Mood, MD;  Location: [facility] ENDOSCOPY;  Service: Gastroenterology;  Laterality: N/A;   EYE SURGERY  08/04/2017   KNEE SURGERY Bilateral     Current Medications: Current Meds  Medication Sig   acetaminophen (TYLENOL) 500 MG tablet Take 1-2 tablets (500-1,000 mg total) by mouth every 8 (eight) hours as needed for moderate pain. OTC   BIOTIN PO Take 1 capsule by mouth daily at 6 (six) AM.   Calcium Carbonate-Vit D-Min (CALCIUM 1200 PO) Take 600 mg by mouth daily.    Cholecalciferol (VITAMIN D) 125 MCG (5000 UT) CAPS Take 1 capsule by mouth daily.   CINNAMON PO Take by mouth.   Cyanocobalamin (VITAMIN B-12) 1000 MCG SUBL Place 1,000 mcg under the tongue daily.    levocetirizine (XYZAL) 5 MG tablet Take 1 tablet (5 mg total) by mouth every evening.   lidocaine (LIDODERM) 5 % Place 1 patch onto the skin daily. As needed for msk pain   meloxicam (MOBIC) 7.5 MG tablet Take 7.5 mg by mouth daily.   nystatin (MYCOSTATIN) 100000 UNIT/ML suspension Take 5 mLs (500,000 Units total) by mouth 4 (four) times daily.   Omega-3 Fatty Acids (FISH OIL PO) Take 1 capsule by mouth daily at 6 (six) AM.   pantoprazole (PROTONIX) 40 MG tablet Take 1 tablet (40 mg total) by mouth daily as needed. For GERD/acid reflux   REFRESH CELLUVISC 1 % GEL    tiZANidine (ZANAFLEX) 4 MG tablet Take 1 tablet (4 mg  total) by mouth every 8 (eight) hours as needed for muscle spasms (muscle tightness).   traMADol (ULTRAM) 50 MG tablet Take 1 tablet (50 mg total) by mouth every 12 (twelve) hours as needed.   TURMERIC PO Take by mouth.   TYRVAYA 0.03 MG/ACT SOLN    [DISCONTINUED] metoprolol succinate (TOPROL XL) 25 MG 24 hr tablet Take 0.5 tablets (12.5 mg total) by mouth daily with breakfast.     Allergies:   Celecoxib and Flonase [fluticasone]   Social History   Socioeconomic History   Marital status: Married    Spouse name: Not on file   Number of children: 3   Years of education: Not on file   Highest education level: High school graduate  Occupational History   Not on file  Tobacco Use   Smoking status: Never   Smokeless tobacco: Never  Vaping Use   Vaping status: Never Used  Substance and  Sexual Activity   Alcohol use: No   Drug use: No   Sexual activity: Not Currently  Other Topics Concern   Not on file  Social History Narrative   Not on file   Social Determinants of Health   Financial Resource Strain: Low Risk  (06/28/2022)   Overall Financial Resource Strain (CARDIA)    Difficulty of Paying Living Expenses: Not hard at all  Food Insecurity: No Food Insecurity (06/28/2022)   Hunger Vital Sign    Worried About Running Out of Food in the Last Year: Never true    Ran Out of Food in the Last Year: Never true  Transportation Needs: No Transportation Needs (06/28/2022)   PRAPARE - Administrator, Civil Service (Medical): No    Lack of Transportation (Non-Medical): No  Physical Activity: Sufficiently Active (06/28/2022)   Exercise Vital Sign    Days of Exercise per Week: 7 days    Minutes of Exercise per Session: 60 min  Stress: No Stress Concern Present (06/28/2022)   [employer] of Occupational Health - Occupational Stress Questionnaire    Feeling of Stress : Not at all  Social Connections: Moderately Integrated (06/28/2022)   Social Connection and Isolation Panel  [NHANES]    Frequency of Communication with Friends and Family: More than three times a week    Frequency of Social Gatherings with Friends and Family: More than three times a week    Attends Religious Services: More than 4 times per year    Active Member of [employer] or Organizations: No    Attends Engineer, structural: Never    Marital Status: Married     Family History: The patient's family history includes Alcohol abuse in her maternal grandfather; Arthritis in her sister; Asthma in her sister; Breast cancer in an other family member; COPD in her sister; Diabetes in her father, maternal grandfather, maternal grandmother, paternal grandfather, and paternal grandmother; Emphysema in her maternal grandmother and paternal grandfather; Fibromyalgia in her sister; Heart attack in her paternal grandmother; Heart disease in her paternal grandfather; Hyperlipidemia in her sister; Hypertension in her father, maternal grandmother, mother, and sister; Kidney disease in her father; Kidney failure in her father; Stroke in her mother; Thyroid disease in her mother, sister, and sister; Vitamin D deficiency in her sister. There is no history of Cancer.  ROS:   Please see the history of present illness.     All other systems reviewed and are negative.  EKGs/Labs/Other Studies Reviewed:    The following studies were reviewed today:  Echocardiogram 02/26/2019   1. Left ventricular ejection fraction, by visual estimation, is 50 to  55%. The left ventricle has normal function. There is no left ventricular  hypertrophy.   2. Global right ventricle has normal systolic function.The right  ventricular size is normal. No increase in right ventricular wall  thickness.   3. Left atrial size was normal.   4. Right atrial size was normal.   5. The mitral valve is normal in structure. Mild mitral valve  regurgitation.   6. The tricuspid valve is normal in structure. Tricuspid valve  regurgitation is  trivial.   7. The aortic valve is normal in structure. Aortic valve regurgitation is  trivial by color flow Doppler.   8. The pulmonic valve was grossly normal. Pulmonic valve regurgitation is  trivial by color flow Doppler.   Arrhythmia monitor 01/24/2023    HR 53 - 198, average 78 bpm. 790 nonsustained SVT, longest 14.3 seconds  with an average rate of 153 bpm. Frequent supraventricular ectopy, 7.1%. Rare ventricular ectopy. No sustained arrhythmias. No atrial fibrillation.  Recent Labs: 12/31/2022: ALT 13; BUN 9; Creatinine, Ser 0.61; Hemoglobin 13.1; Magnesium 2.3; Platelets 254; Potassium 3.7; Sodium 138; TSH 1.049  Recent Lipid Panel    Component Value Date/Time   CHOL 268 (H) 08/12/2022 1603   CHOL 227 (H) 07/25/2015 1043   TRIG 79 08/12/2022 1603   HDL 82 08/12/2022 1603   HDL 88 07/25/2015 1043   CHOLHDL 3.3 08/12/2022 1603   VLDL 13 02/26/2019 0231   LDLCALC 168 (H) 08/12/2022 1603     Risk Assessment/Calculations:                Physical Exam:    VS:  BP 114/78 (BP Location: Left Arm, Patient Position: Sitting, Cuff Size: Normal)   Pulse 72   Ht 5\' 7"  (1.702 m)   Wt 121 lb 9.6 oz (55.2 kg)   SpO2 98%   BMI 19.05 kg/m     Wt Readings from Last 3 Encounters:  03/14/23 121 lb 9.6 oz (55.2 kg)  02/19/23 123 lb 12.8 oz (56.2 kg)  01/01/23 117 lb 4.8 oz (53.2 kg)     GEN: Appears very lean and fit, younger than stated age well nourished, well developed in no acute distress HEENT: Normal NECK: No JVD; No carotid bruits LYMPHATICS: No lymphadenopathy CARDIAC: RRR, no murmurs, rubs, gallops RESPIRATORY:  Clear to auscultation without rales, wheezing or rhonchi  ABDOMEN: Soft, non-tender, non-distended MUSCULOSKELETAL:  No edema; No deformity  SKIN: Warm and dry NEUROLOGIC:  Alert and oriented x 3 PSYCHIATRIC:  Normal affect   ASSESSMENT:    1. Paroxysmal SVT (supraventricular tachycardia) (HCC)   2. Palpitations   3. Mixed hyperlipidemia   4.  Atherosclerosis of aorta (HCC)    PLAN:    In order of problems listed above:  SVT: Appears to be nonsustained ectopic atrial tachycardia.  Has very frequent PACs and very frequent brief bursts of SVT, but no atrial fibrillation.  No major underlying structural heart disease.  No history of stroke or TIA. Aortic atherosclerosis: Noted on imaging studies.  Increases her risk of having CAD or PAD, but currently asymptomatic.  Quantify risk with a coronary calcium score, especially since we cannot reduce her LDL cholesterol level with conventional statin therapy. HLP: Statin intolerant due to cognitive dysfunction.  Will try to see if we can get approval for PCSK9 inhibitor or Leqvio.           Medication Adjustments/Labs and Tests Ordered: Current medicines are reviewed at length with the patient today.  Concerns regarding medicines are outlined above.  Orders Placed This Encounter  Procedures   AMB Referral to [facility] Pharm-D   EKG 12-Lead   Meds ordered this encounter  Medications   metoprolol succinate (TOPROL XL) 25 MG 24 hr tablet    Sig: Take 1 tablet (25 mg total) by mouth daily with breakfast.    Dispense:  90 tablet    Refill:  3    Patient Instructions  Medication Instructions:  Increase Toprol XL to 25 mg daily *If you need a refill on your cardiac medications before your next appointment, please call your pharmacy*  Testing/Procedures: You can look into the [facility] device by [employer]. This device is purchased by you and it connects to an application you download to your smart phone.  It can detect abnormal heart rhythms and alert you to contact your doctor for  further evaluation. The web site is:  [email or web address].alivecor.[email or web address]     Follow-Up: At [facility], you and your health needs are our priority.  As part of our continuing mission to provide you with exceptional heart care, we have created designated Provider Care Teams.  These Care Teams include  your primary Cardiologist (physician) and Advanced Practice Providers (APPs -  Physician Assistants and Nurse Practitioners) who all work together to provide you with the care you need, when you need it.  We recommend signing up for the patient portal called "MyChart".  Sign up information is provided on this After Visit Summary.  MyChart is used to connect with patients for Virtual Visits (Telemedicine).  Patients are able to view lab/test results, encounter notes, upcoming appointments, etc.  Non-urgent messages can be sent to your provider as well.   To learn more about what you can do with MyChart, go to [email or web address].    Your next appointment:    Pharm D- in a few weeks- for Lipids   Dr Royann Shivers in one year    Signed, Thurmon Fair, MD  03/14/2023 12:38 PM    [facility]

## 2023-03-17 ENCOUNTER — Ambulatory Visit: Payer: Medicare HMO | Admitting: Physician Assistant

## 2023-03-21 DIAGNOSIS — M170 Bilateral primary osteoarthritis of knee: Secondary | ICD-10-CM | POA: Diagnosis not present

## 2023-03-28 ENCOUNTER — Ambulatory Visit
Admission: RE | Admit: 2023-03-28 | Discharge: 2023-03-28 | Disposition: A | Discharge status: Home / Self Care | Payer: Medicare HMO | Source: Ambulatory Visit | Attending: Cardiovascular Disease | Admitting: Cardiovascular Disease

## 2023-03-28 DIAGNOSIS — E782 Mixed hyperlipidemia: Secondary | ICD-10-CM | POA: Insufficient documentation

## 2023-03-28 DIAGNOSIS — Z136 Encounter for screening for cardiovascular disorders: Secondary | ICD-10-CM | POA: Diagnosis not present

## 2023-03-28 DIAGNOSIS — I700 Atherosclerosis of aorta: Secondary | ICD-10-CM | POA: Diagnosis not present

## 2023-03-31 ENCOUNTER — Telehealth: Payer: Self-pay | Admitting: Emergency Medicine

## 2023-03-31 NOTE — Telephone Encounter (Signed)
[name], Ebony Hora, MD  Danelle Berry, PA-C Cc: Scheryl Marten, RN Very good news on the calcium score. The score is  zero.  This makes for a very low likelihood of serious coronary complications for the next several years.  Went over the information above with the patient. She verbalized understanding.

## 2023-04-16 ENCOUNTER — Ambulatory Visit: Payer: Medicare HMO

## 2023-04-16 ENCOUNTER — Ambulatory Visit: Payer: Medicare HMO | Admitting: Physician Assistant

## 2023-04-17 ENCOUNTER — Other Ambulatory Visit: Payer: Self-pay | Admitting: Family Medicine

## 2023-04-17 DIAGNOSIS — R002 Palpitations: Secondary | ICD-10-CM

## 2023-04-17 DIAGNOSIS — I471 Supraventricular tachycardia, unspecified: Secondary | ICD-10-CM

## 2023-04-21 NOTE — Progress Notes (Deleted)
Celso Amy, PA-C [address]  [address]  [place], [place] [address]  Main: [phone]  Fax: [phone]   Primary Care Physician: Danelle Berry, PA-C  Primary Gastroenterologist:  Celso Amy, PA-C / Dr. Wyline Mood    CC: F/U esophageal candidiasis, dysphagia, GERD, thrush  HPI: Ebony [name] is a 72 y.o. female returns for 49-month follow-up of esophageal candidiasis.  Past history of antibiotic use, Flonase nasal steroid, and oral prednisone.  10/2022 EGD by Dr. Tobi Bastos: Diffuse white plaques in the entire esophagus, esophageal dysmotility, normal stomach and duodenum.  Biopsies positive for yeast and erosive esophagitis.  Negative for Barrett's.  Candida treated with oral fluconazole 400 mg day 1 then 200 Mg daily for 13 more days.  EGD 05/2020 showed diffuse white plaques in the entire esophagus consistent with esophageal candidiasis.  Biopsies confirmed acute candidal esophagitis.  Treated with Diflucan.   Repeat EGD done by Dr. Tobi Bastos 08/2020 showed evidence of esophageal candidiasis in the middle third of the esophagus.  KOH prep positive for yeast.  No stricture.  Normal stomach and duodenum.  Treated with Diflucan.    Colonoscopy 05/2017 by Dr. Servando Snare showed fair prep, nonbleeding internal hemorrhoids, and no polyps.  Repeat in 10 years.  Current Outpatient Medications  Medication Sig Dispense Refill   acetaminophen (TYLENOL) 500 MG tablet Take 1-2 tablets (500-1,000 mg total) by mouth every 8 (eight) hours as needed for moderate pain. OTC 30 tablet 0   BIOTIN PO Take 1 capsule by mouth daily at 6 (six) AM.     Calcium Carbonate-Vit D-Min (CALCIUM 1200 PO) Take 600 mg by mouth daily.      Cholecalciferol (VITAMIN D) 125 MCG (5000 UT) CAPS Take 1 capsule by mouth daily.     CINNAMON PO Take by mouth.     Cyanocobalamin (VITAMIN B-12) 1000 MCG SUBL Place 1,000 mcg under the tongue daily.      ipratropium (ATROVENT) 0.03 % nasal spray Place 2 sprays into both nostrils every  12 (twelve) hours. (Patient not taking: Reported on 03/14/2023) 30 mL 12   levocetirizine (XYZAL) 5 MG tablet Take 1 tablet (5 mg total) by mouth every evening. 30 tablet 11   lidocaine (LIDODERM) 5 % Place 1 patch onto the skin daily. As needed for msk pain 30 patch 2   meloxicam (MOBIC) 7.5 MG tablet Take 7.5 mg by mouth daily.     metoprolol succinate (TOPROL XL) 25 MG 24 hr tablet Take 1 tablet (25 mg total) by mouth daily with breakfast. 90 tablet 3   nystatin (MYCOSTATIN) 100000 UNIT/ML suspension Take 5 mLs (500,000 Units total) by mouth 4 (four) times daily. 60 mL 0   Omega-3 Fatty Acids (FISH OIL PO) Take 1 capsule by mouth daily at 6 (six) AM.     pantoprazole (PROTONIX) 40 MG tablet Take 1 tablet (40 mg total) by mouth daily as needed. For GERD/acid reflux 30 tablet 1   REFRESH CELLUVISC 1 % GEL      RESTASIS 0.05 % ophthalmic emulsion Instill 1 drop into both eyes twice a day (Patient not taking: Reported on 03/14/2023)     rosuvastatin (CRESTOR) 10 MG tablet TAKE 1 TABLET EVERY DAY (DOSE CHANGE) (Patient not taking: Reported on 03/14/2023) 90 tablet 3   tiZANidine (ZANAFLEX) 4 MG tablet Take 1 tablet (4 mg total) by mouth every 8 (eight) hours as needed for muscle spasms (muscle tightness). 30 tablet 0   traMADol (ULTRAM) 50 MG tablet Take 1  tablet (50 mg total) by mouth every 12 (twelve) hours as needed. 10 tablet 0   TURMERIC PO Take by mouth.     TYRVAYA 0.03 MG/ACT SOLN      No current facility-administered medications for this visit.    Allergies as of 04/22/2023 - Review Complete 03/14/2023  Allergen Reaction Noted   Celecoxib Anxiety, Itching, and Other (See Comments) 07/11/2015   Flonase [fluticasone] Other (See Comments) 09/24/2022    Past Medical History:  Diagnosis Date   Adjustment disorder with mixed anxiety and depressed mood 10/11/2015   Allergy    Anemia    Arthritis    knees, hands   Borderline diabetes mellitus    Cholelithiasis    Complication of  anesthesia    slow to wake   Dry eye syndrome of both eyes    Dysphagia 06/12/2020   GERD (gastroesophageal reflux disease)    Hypercholesterolemia    Loss of weight 12/17/2016   Osteoarthritis of left knee 01/07/2023   Pain in joint of left knee 12/14/2020   Palpitation 09/29/2019   Prehypertension 12/17/2016   Wears dentures    full upper and lower    Past Surgical History:  Procedure Laterality Date   ABDOMINAL HYSTERECTOMY     complete   BIOPSY  10/22/2022   Procedure: BIOPSY;  Surgeon: Wyline Mood, MD;  Location: [facility] ENDOSCOPY;  Service: Gastroenterology;;   COLONOSCOPY WITH PROPOFOL N/A 05/09/2017   Procedure: COLONOSCOPY WITH PROPOFOL;  Surgeon: Midge Minium, MD;  Location: [facility];  Service: Endoscopy;  Laterality: N/A;   ESOPHAGEAL BRUSHING  10/22/2022   Procedure: ESOPHAGEAL BRUSHING;  Surgeon: Wyline Mood, MD;  Location: [facility] ENDOSCOPY;  Service: Gastroenterology;;   ESOPHAGOGASTRODUODENOSCOPY (EGD) WITH PROPOFOL N/A 05/30/2020   Procedure: ESOPHAGOGASTRODUODENOSCOPY (EGD) WITH PROPOFOL;  Surgeon: Wyline Mood, MD;  Location: [facility] ENDOSCOPY;  Service: Gastroenterology;  Laterality: N/A;   ESOPHAGOGASTRODUODENOSCOPY (EGD) WITH PROPOFOL N/A 08/07/2020   Procedure: ESOPHAGOGASTRODUODENOSCOPY (EGD) WITH PROPOFOL;  Surgeon: Wyline Mood, MD;  Location: [facility] ENDOSCOPY;  Service: Gastroenterology;  Laterality: N/A;   ESOPHAGOGASTRODUODENOSCOPY (EGD) WITH PROPOFOL N/A 10/22/2022   Procedure: ESOPHAGOGASTRODUODENOSCOPY (EGD) WITH PROPOFOL;  Surgeon: Wyline Mood, MD;  Location: [facility] ENDOSCOPY;  Service: Gastroenterology;  Laterality: N/A;   EYE SURGERY  08/04/2017   KNEE SURGERY Bilateral     Review of Systems:    All systems reviewed and negative except where noted in HPI.   Physical Examination:   There were no vitals taken for this visit.  General: Well-nourished, well-developed in no acute distress.  Lungs: Clear to auscultation bilaterally. Non-labored. Heart:  Regular rate and rhythm, no murmurs rubs or gallops.  Abdomen: Bowel sounds are normal; Abdomen is Soft; No hepatosplenomegaly, masses or hernias;  No Abdominal Tenderness; No guarding or rebound tenderness. Neuro: Alert and oriented x 3.  Grossly intact.  Psych: Alert and cooperative, normal mood and affect.   Imaging Studies: CT CARDIAC SCORING (SELF PAY ONLY) Addendum Date: 04/13/2023 ADDENDUM REPORT: 04/13/2023 16:47 EXAM: OVER-READ INTERPRETATION  CT CHEST The following report is a limited chest CT over-read performed by radiologist Dr. Jeronimo Greaves of [facility] on 03/28/2023. This over-read does not include interpretation of cardiac or coronary anatomy or pathology. The calcium score interpretation by the cardiologist is attached. COMPARISON:  CTA chest 12/30/2020 FINDINGS: No pleural fluid.  Clear imaged lungs. Aortic atherosclerosis. Tortuous thoracic aorta. No imaged thoracic adenopathy. Normal imaged portions of the liver, spleen, stomach, adrenal glands. Osteopenia. IMPRESSION: No acute findings in the imaged extracardiac chest.  Electronically Signed   By: Jeronimo Greaves M.D.   On: 04/13/2023 16:47   Result Date: 04/13/2023 CLINICAL DATA:  Risk stratification EXAM: Coronary Calcium Score TECHNIQUE: The patient was scanned on a Siemens Somatom scanner. Axial non-contrast 3 mm slices were carried out through the heart. The data set was analyzed on a dedicated work station and scored using the Agatson method. FINDINGS: Non-cardiac: See separate report from [facility]. Ascending Aorta: Normal size Pericardium: Normal Coronary arteries: Normal origin of left and right coronary arteries. Distribution of arterial calcifications if present, as noted below; LM 0 LAD 0 LCx 0 RCA 0 Total 0 IMPRESSION AND RECOMMENDATION: 1. Coronary calcium score of 0. 2. CAC 0, CAC-DRS A0. 3. Continue heart healthy lifestyle and risk factor modification. Electronically Signed: By: Debbe Odea  M.D. On: 03/28/2023 15:07    Assessment and Plan:   Ebony [name] is a 72 y.o. y/o female   1.  History of esophageal candidiasis 2.  Esophageal dysmotility 3.  Erosive esophagitis   Celso Amy, PA-C  Follow up ***  BP check ***

## 2023-04-22 ENCOUNTER — Ambulatory Visit: Payer: Medicare HMO | Admitting: Physician Assistant

## 2023-04-24 ENCOUNTER — Encounter: Payer: Self-pay | Admitting: Student

## 2023-04-24 ENCOUNTER — Telehealth: Payer: Self-pay | Admitting: Pharmacy Technician

## 2023-04-24 ENCOUNTER — Ambulatory Visit: Payer: Medicare HMO | Attending: Internal Medicine | Admitting: Student

## 2023-04-24 ENCOUNTER — Other Ambulatory Visit (HOSPITAL_COMMUNITY): Payer: Self-pay

## 2023-04-24 ENCOUNTER — Telehealth: Payer: Self-pay | Admitting: Pharmacist

## 2023-04-24 DIAGNOSIS — E7849 Other hyperlipidemia: Secondary | ICD-10-CM

## 2023-04-24 MED ORDER — REPATHA SURECLICK 140 MG/ML ~~LOC~~ SOAJ: 140.0000 mg | SUBCUTANEOUS | 3 refills | Status: AC

## 2023-04-24 NOTE — Assessment & Plan Note (Signed)
Assessment:  LDL goal: <70 mg/dl last LDLc 621 mg/dl off statin  Intolerance to statins Crestor 10 mg Pravastatin - brain fog and forgetfulness  Discussed next potential options (low dose statin, ezetimibe PCSK-9 inhibitors, bempedoic acid and inclisiran); cost, dosing efficacy, side effects  Eats fairly healthy diet and stays active around the house   Patient is in agreement to try half dose of the Crestor to see if she can tolerate that better  Given baseline LDLc and risk factors  reasonable to add PCSK9i to max tolerated statins   Plan: Start taking Crestor 5 mg daily if not tolerated take 5 mg every other day will follow up in 2 weeks via phone Will apply for PA for PCSK9i; will inform patient upon approval  Lipid lab due in 2-3 months after starting [facility]

## 2023-04-24 NOTE — Progress Notes (Signed)
Patient ID: Ebony [name]                 DOB: August 06, 1950                    MRN: [id number]      HPI: Ebony [name] is a 72 y.o. female patient referred to lipid clinic by Dr. Royann Shivers PMH is significant for hx of possible subsegmental pulmonary embolism (August 2024), hyperlipidemia, prediabetes, cholelithiasis.  Apr 2024 lab total cholesterol - 268 and LDL -168 and HDL -82. She has been prescribed rosuvastatin 10 mg daily (in the past has been on doses varying between 5-20 mg). She has stopped taking the rosuvastatin because every time she takes that she notices a decline in her cognitive function. She begins to forget what she was doing and feels foggy. She had similar complaints with pravastatin in the past, although they were not as prominent. Patient reports she is taking care of her sick mother and sick husband from Jan 2024. It is too stressful for her. She eats mainly home cooked healthy meals. She stays active around the house but no set exercise schedule.  Reviewed options for lowering LDL cholesterol, including other statin ezetimibe, PCSK-9 inhibitors, bempedoic acid and inclisiran.  Discussed mechanisms of action, dosing, side effects and potential decreases in LDL cholesterol.  Also reviewed cost information and potential options for patient assistance.  Current Medications: none  Intolerances: Crestor 10 mg Pravastatin - brain fog and forgetfulness  Risk Factors: statin intolerance, hypertension, HLD, family hx of ASCVD LDL goal:<70 mg/dl   Diet:  sometime eat out but mainly eats home cooked healthy meals   Exercise: stays active around the house, going back to water aerobics   Family History:  Relation Problem Comments  Mother (Alive) Hypertension   Stroke 4 mini strokes  Thyroid disease     Father (Deceased) Diabetes   Hypertension   Kidney disease   Kidney failure     Sister - erma Metallurgist) Arthritis   Thyroid disease   Vitamin D deficiency     Sister - deborah  Metallurgist) COPD   Fibromyalgia   Hyperlipidemia   Hypertension     Sister - brenda Metallurgist) Asthma   Thyroid disease     Brother (Deceased)   Maternal Grandmother (Deceased) Diabetes   Emphysema   Hypertension     Maternal Grandfather (Deceased) Alcohol abuse   Diabetes     Paternal Grandmother (Deceased) Diabetes   Heart attack     Paternal Grandfather (Deceased) Diabetes   Emphysema   Heart disease     Other - Niece (Deceased) Breast cancer     Social History:  Alcohol: none  Smoking: never  Labs: Lipid Panel     Component Value Date/Time   CHOL 268 (H) 08/12/2022 1603   CHOL 227 (H) 07/25/2015 1043   TRIG 79 08/12/2022 1603   HDL 82 08/12/2022 1603   HDL 88 07/25/2015 1043   CHOLHDL 3.3 08/12/2022 1603   VLDL 13 02/26/2019 0231   LDLCALC 168 (H) 08/12/2022 1603   LABVLDL 7 07/25/2015 1043    Past Medical History:  Diagnosis Date   Adjustment disorder with mixed anxiety and depressed mood 10/11/2015   Allergy    Anemia    Arthritis    knees, hands   Borderline diabetes mellitus    Cholelithiasis    Complication of anesthesia    slow to wake   Dry eye syndrome of both eyes  Dysphagia 06/12/2020   GERD (gastroesophageal reflux disease)    Hypercholesterolemia    Loss of weight 12/17/2016   Osteoarthritis of left knee 01/07/2023   Pain in joint of left knee 12/14/2020   Palpitation 09/29/2019   Prehypertension 12/17/2016   Wears dentures    full upper and lower    Current Outpatient Medications on File Prior to Visit  Medication Sig Dispense Refill   acetaminophen (TYLENOL) 500 MG tablet Take 1-2 tablets (500-1,000 mg total) by mouth every 8 (eight) hours as needed for moderate pain. OTC 30 tablet 0   BIOTIN PO Take 1 capsule by mouth daily at 6 (six) AM.     Calcium Carbonate-Vit D-Min (CALCIUM 1200 PO) Take 600 mg by mouth daily.      Cholecalciferol (VITAMIN D) 125 MCG (5000 UT) CAPS Take 1 capsule by mouth daily.     CINNAMON PO Take by  mouth.     Cyanocobalamin (VITAMIN B-12) 1000 MCG SUBL Place 1,000 mcg under the tongue daily.      ipratropium (ATROVENT) 0.03 % nasal spray Place 2 sprays into both nostrils every 12 (twelve) hours. (Patient not taking: Reported on 03/14/2023) 30 mL 12   levocetirizine (XYZAL) 5 MG tablet Take 1 tablet (5 mg total) by mouth every evening. 30 tablet 11   lidocaine (LIDODERM) 5 % Place 1 patch onto the skin daily. As needed for msk pain 30 patch 2   meloxicam (MOBIC) 7.5 MG tablet Take 7.5 mg by mouth daily.     metoprolol succinate (TOPROL XL) 25 MG 24 hr tablet Take 1 tablet (25 mg total) by mouth daily with breakfast. 90 tablet 3   nystatin (MYCOSTATIN) 100000 UNIT/ML suspension Take 5 mLs (500,000 Units total) by mouth 4 (four) times daily. 60 mL 0   Omega-3 Fatty Acids (FISH OIL PO) Take 1 capsule by mouth daily at 6 (six) AM.     pantoprazole (PROTONIX) 40 MG tablet Take 1 tablet (40 mg total) by mouth daily as needed. For GERD/acid reflux 30 tablet 1   REFRESH CELLUVISC 1 % GEL      RESTASIS 0.05 % ophthalmic emulsion Instill 1 drop into both eyes twice a day (Patient not taking: Reported on 03/14/2023)     rosuvastatin (CRESTOR) 10 MG tablet TAKE 1 TABLET EVERY DAY (DOSE CHANGE) (Patient not taking: Reported on 03/14/2023) 90 tablet 3   tiZANidine (ZANAFLEX) 4 MG tablet Take 1 tablet (4 mg total) by mouth every 8 (eight) hours as needed for muscle spasms (muscle tightness). 30 tablet 0   traMADol (ULTRAM) 50 MG tablet Take 1 tablet (50 mg total) by mouth every 12 (twelve) hours as needed. 10 tablet 0   TURMERIC PO Take by mouth.     TYRVAYA 0.03 MG/ACT SOLN      No current facility-administered medications on file prior to visit.    Allergies  Allergen Reactions   Celecoxib Anxiety, Itching and Other (See Comments)   Flonase [Fluticasone] Other (See Comments)    Candidal infection when she uses steroids - severe to throat and esophagus - avoid intranasal steroid sprays      Assessment/Plan:  1. Hyperlipidemia -  Problem  Hyperlipidemia   Current Medications: none  Intolerances: Crestor 10 mg Pravastatin - brain fog and forgetfulness  Risk Factors: statin intolerance, hypertension, HLD, family hx of ASCVD LDL goal:<70 mg/dl     Hyperlipidemia Assessment:  LDL goal: <70 mg/dl last LDLc 478 mg/dl off statin  Intolerance to statins Crestor 10  mg Pravastatin - brain fog and forgetfulness  Discussed next potential options (low dose statin, ezetimibe PCSK-9 inhibitors, bempedoic acid and inclisiran); cost, dosing efficacy, side effects  Eats fairly healthy diet and stays active around the house   Patient is in agreement to try half dose of the Crestor to see if she can tolerate that better  Given baseline LDLc and risk factors  reasonable to add PCSK9i to max tolerated statins   Plan: Start taking Crestor 5 mg daily if not tolerated take 5 mg every other day will follow up in 2 weeks via phone Will apply for PA for PCSK9i; will inform patient upon approval  Lipid lab due in 2-3 months after starting PCSK9i    Thank you,  Carmela Hurt, Pharm.D [facility] [address] [place], [place] [address]  Phone: [phone]; Fax: [phone]

## 2023-04-24 NOTE — Telephone Encounter (Signed)
Pharmacy Patient Advocate Encounter  Received notification from [facility] that Prior Authorization for repatha has been APPROVED from 05/06/22 to 05/05/24. Ran test claim, Copay is $45.00 one month. This test claim was processed through [facility]- copay amounts may vary at other pharmacies due to pharmacy/plan contracts, or as the patient moves through the different stages of their insurance plan.   PA #/Case ID/Reference #: [id number]

## 2023-04-24 NOTE — Telephone Encounter (Signed)
Pharmacy Patient Advocate Encounter   Received notification from Pt Calls Messages that prior authorization for repatha is required/requested.   Insurance verification completed.   The patient is insured through [employer] .   Per test claim: PA required; PA submitted to above mentioned insurance via CoverMyMeds Key/confirmation #/EOC [id number] Status is pending

## 2023-04-24 NOTE — Telephone Encounter (Signed)
PA has been approved

## 2023-04-24 NOTE — Telephone Encounter (Signed)
Call pt to inform about PA approval N/A and voice mail has not been activated. Prescription sent to preferred pharmacy. Will try to call again tomorrow

## 2023-04-24 NOTE — Patient Instructions (Addendum)
Your Results:             Your most recent labs Goal  Total Cholesterol 268 < 200  Triglycerides 79 < 150  HDL (happy/good cholesterol) 83 > 40  LDL (lousy/bad cholesterol 168 < 70   Medication changes: Start taking Crestor 5 mg daily if not tolerated take 5 mg every other day will follow up in 2 weeks via phone.  We will start the process to get PCSK9i (Repatha and Praluent)  covered by your insurance.  Once the prior authorization is complete, we will call you to let you know and confirm pharmacy information.    Praluent is a cholesterol medication that improved your body's ability to get rid of "bad cholesterol" known as LDL. It can lower your LDL up to 60%. It is an injection that is given under the skin every 2 weeks. The most common side effects of Praluent include runny nose, symptoms of the common cold, rarely flu or flu-like symptoms, back/muscle pain in about 3-4% of the patients, and redness, pain, or bruising at the injection site.    Repatha is a cholesterol medication that improved your body's ability to get rid of "bad cholesterol" known as LDL. It can lower your LDL up to 60%! It is an injection that is given under the skin every 2 weeks. The most common side effects of Repatha include runny nose, symptoms of the common cold, rarely flu or flu-like symptoms, back/muscle pain in about 3-4% of the patients, and redness, pain, or bruising at the injection site.   Lab orders: We want to repeat labs after 2-3 months.  We will send you a lab order to remind you once we get closer to that time.

## 2023-04-24 NOTE — Addendum Note (Signed)
Addended by: Tylene Fantasia on: 04/24/2023 04:51 PM   Modules accepted: Orders

## 2023-04-25 NOTE — Telephone Encounter (Signed)
Enrolled pt in [facility] grant   Card No. [id number] Card Status Active BIN [id number] PCN [id number] PC Group [id number]  Info provided over the phone

## 2023-05-13 NOTE — Telephone Encounter (Signed)
 Call to assess  rosuvastatin tolerability and to check patient has started taking Repatha or not

## 2023-05-17 ENCOUNTER — Other Ambulatory Visit: Payer: Self-pay | Admitting: Family Medicine

## 2023-05-17 DIAGNOSIS — K219 Gastro-esophageal reflux disease without esophagitis: Secondary | ICD-10-CM

## 2023-06-03 ENCOUNTER — Encounter: Payer: Self-pay | Admitting: Pharmacist

## 2023-06-03 NOTE — Telephone Encounter (Signed)
Error

## 2023-06-13 ENCOUNTER — Other Ambulatory Visit: Payer: Self-pay | Admitting: Physician Assistant

## 2023-06-13 DIAGNOSIS — B3781 Candidal esophagitis: Secondary | ICD-10-CM

## 2023-06-13 DIAGNOSIS — K219 Gastro-esophageal reflux disease without esophagitis: Secondary | ICD-10-CM

## 2023-06-16 ENCOUNTER — Telehealth: Payer: Self-pay

## 2023-06-16 MED ORDER — NYSTATIN 100000 UNIT/ML MT SUSP: 5.0000 mL | Freq: Four times a day (QID) | OROMUCOSAL | 0 refills | Status: DC

## 2023-06-16 NOTE — Telephone Encounter (Signed)
Patient is returning your call and would like a call back  

## 2023-06-16 NOTE — Telephone Encounter (Signed)
 Tried reaching patient- mailbox full unable to leave message- patient needs to schedule an appointment to see Brian Campanile. Nystatin  suspension sent to pharmacy.

## 2023-06-20 DIAGNOSIS — M170 Bilateral primary osteoarthritis of knee: Secondary | ICD-10-CM | POA: Diagnosis not present

## 2023-07-01 DIAGNOSIS — M170 Bilateral primary osteoarthritis of knee: Secondary | ICD-10-CM | POA: Diagnosis not present

## 2023-07-01 NOTE — Progress Notes (Deleted)
 Celso Amy, PA-C [address]  [address]  [place], [place] [address]  Main: [phone]  Fax: [phone]   Primary Care Physician: Danelle Berry, PA-C  Primary Gastroenterologist:  ***  CC:  Candida Esophagitis  HPI: Ebony [name] is a 73 y.o. female  Current Outpatient Medications  Medication Sig Dispense Refill   acetaminophen (TYLENOL) 500 MG tablet Take 1-2 tablets (500-1,000 mg total) by mouth every 8 (eight) hours as needed for moderate pain. OTC 30 tablet 0   BIOTIN PO Take 1 capsule by mouth daily at 6 (six) AM.     Calcium Carbonate-Vit D-Min (CALCIUM 1200 PO) Take 600 mg by mouth daily.      Cholecalciferol (VITAMIN D) 125 MCG (5000 UT) CAPS Take 1 capsule by mouth daily.     CINNAMON PO Take by mouth.     Cyanocobalamin (VITAMIN B-12) 1000 MCG SUBL Place 1,000 mcg under the tongue daily.      Evolocumab (REPATHA SURECLICK) 140 MG/ML SOAJ Inject 140 mg into the skin every 14 (fourteen) days. 6 mL 3   ipratropium (ATROVENT) 0.03 % nasal spray Place 2 sprays into both nostrils every 12 (twelve) hours. (Patient not taking: Reported on 03/14/2023) 30 mL 12   levocetirizine (XYZAL) 5 MG tablet Take 1 tablet (5 mg total) by mouth every evening. 30 tablet 11   lidocaine (LIDODERM) 5 % Place 1 patch onto the skin daily. As needed for msk pain 30 patch 2   meloxicam (MOBIC) 7.5 MG tablet Take 7.5 mg by mouth daily.     metoprolol succinate (TOPROL XL) 25 MG 24 hr tablet Take 1 tablet (25 mg total) by mouth daily with breakfast. 90 tablet 3   nystatin (MYCOSTATIN) 100000 UNIT/ML suspension Take 5 mLs (500,000 Units total) by mouth 4 (four) times daily. 60 mL 0   nystatin (MYCOSTATIN) 100000 UNIT/ML suspension Take 5 mLs (500,000 Units total) by mouth 4 (four) times daily. 60 mL 0   Omega-3 Fatty Acids (FISH OIL PO) Take 1 capsule by mouth daily at 6 (six) AM.     pantoprazole (PROTONIX) 40 MG tablet TAKE 1 TABLET (40 MG TOTAL) BY MOUTH DAILY AS NEEDED FOR GERD/ACID  REFLUX 90 tablet 0   REFRESH CELLUVISC 1 % GEL      RESTASIS 0.05 % ophthalmic emulsion Instill 1 drop into both eyes twice a day (Patient not taking: Reported on 03/14/2023)     rosuvastatin (CRESTOR) 10 MG tablet TAKE 1 TABLET EVERY DAY (DOSE CHANGE) (Patient not taking: Reported on 03/14/2023) 90 tablet 3   tiZANidine (ZANAFLEX) 4 MG tablet Take 1 tablet (4 mg total) by mouth every 8 (eight) hours as needed for muscle spasms (muscle tightness). 30 tablet 0   traMADol (ULTRAM) 50 MG tablet Take 1 tablet (50 mg total) by mouth every 12 (twelve) hours as needed. 10 tablet 0   TURMERIC PO Take by mouth.     TYRVAYA 0.03 MG/ACT SOLN      No current facility-administered medications for this visit.    Allergies as of 07/02/2023 - Review Complete 04/24/2023  Allergen Reaction Noted   Celecoxib Anxiety, Itching, and Other (See Comments) 07/11/2015   Flonase [fluticasone] Other (See Comments) 09/24/2022    Past Medical History:  Diagnosis Date   Adjustment disorder with mixed anxiety and depressed mood 10/11/2015   Allergy    Anemia    Arthritis    knees, hands   Borderline diabetes mellitus    Cholelithiasis  Complication of anesthesia    slow to wake   Dry eye syndrome of both eyes    Dysphagia 06/12/2020   GERD (gastroesophageal reflux disease)    Hypercholesterolemia    Loss of weight 12/17/2016   Osteoarthritis of left knee 01/07/2023   Pain in joint of left knee 12/14/2020   Palpitation 09/29/2019   Prehypertension 12/17/2016   Wears dentures    full upper and lower    Past Surgical History:  Procedure Laterality Date   ABDOMINAL HYSTERECTOMY     complete   BIOPSY  10/22/2022   Procedure: BIOPSY;  Surgeon: Wyline Mood, MD;  Location: [facility] ENDOSCOPY;  Service: Gastroenterology;;   COLONOSCOPY WITH PROPOFOL N/A 05/09/2017   Procedure: COLONOSCOPY WITH PROPOFOL;  Surgeon: Midge Minium, MD;  Location: [facility] SURGERY [facility];  Service: Endoscopy;  Laterality: N/A;   ESOPHAGEAL  BRUSHING  10/22/2022   Procedure: ESOPHAGEAL BRUSHING;  Surgeon: Wyline Mood, MD;  Location: [facility] ENDOSCOPY;  Service: Gastroenterology;;   ESOPHAGOGASTRODUODENOSCOPY (EGD) WITH PROPOFOL N/A 05/30/2020   Procedure: ESOPHAGOGASTRODUODENOSCOPY (EGD) WITH PROPOFOL;  Surgeon: Wyline Mood, MD;  Location: [facility] ENDOSCOPY;  Service: Gastroenterology;  Laterality: N/A;   ESOPHAGOGASTRODUODENOSCOPY (EGD) WITH PROPOFOL N/A 08/07/2020   Procedure: ESOPHAGOGASTRODUODENOSCOPY (EGD) WITH PROPOFOL;  Surgeon: Wyline Mood, MD;  Location: [facility] ENDOSCOPY;  Service: Gastroenterology;  Laterality: N/A;   ESOPHAGOGASTRODUODENOSCOPY (EGD) WITH PROPOFOL N/A 10/22/2022   Procedure: ESOPHAGOGASTRODUODENOSCOPY (EGD) WITH PROPOFOL;  Surgeon: Wyline Mood, MD;  Location: [facility] ENDOSCOPY;  Service: Gastroenterology;  Laterality: N/A;   EYE SURGERY  08/04/2017   KNEE SURGERY Bilateral     Review of Systems:    All systems reviewed and negative except where noted in HPI.   Physical Examination:   There were no vitals taken for this visit.  General: Well-nourished, well-developed in no acute distress.  Lungs: Clear to auscultation bilaterally. Non-labored. Heart: Regular rate and rhythm, no murmurs rubs or gallops.  Abdomen: Bowel sounds are normal; Abdomen is Soft; No hepatosplenomegaly, masses or hernias;  No Abdominal Tenderness; No guarding or rebound tenderness. Neuro: Alert and oriented x 3.  Grossly intact.  Psych: Alert and cooperative, normal mood and affect.   Imaging Studies: No results found.  Assessment and Plan:   Ebony [name] is a 73 y.o. y/o female ***    Celso Amy, PA-C  Follow up ***  BP check ***

## 2023-07-02 ENCOUNTER — Ambulatory Visit: Payer: Medicare HMO | Admitting: Physician Assistant

## 2023-07-03 ENCOUNTER — Ambulatory Visit: Payer: Medicare HMO

## 2023-07-03 DIAGNOSIS — Z0000 Encounter for general adult medical examination without abnormal findings: Secondary | ICD-10-CM | POA: Diagnosis not present

## 2023-07-03 DIAGNOSIS — Z780 Asymptomatic menopausal state: Secondary | ICD-10-CM

## 2023-07-03 DIAGNOSIS — Z1231 Encounter for screening mammogram for malignant neoplasm of breast: Secondary | ICD-10-CM

## 2023-07-03 NOTE — Progress Notes (Signed)
 Subjective:   Ebony [name] is a 73 y.o. who presents for a Medicare Wellness preventive visit.  Visit Complete: Virtual I connected with  [name] on 07/03/23 by a audio enabled telemedicine application and verified that I am speaking with the correct person using two identifiers.  Patient Location: Home  Provider Location: Office/Clinic  I discussed the limitations of evaluation and management by telemedicine. The patient expressed understanding and agreed to proceed.  Vital Signs: Because this visit was a virtual/telehealth visit, some criteria may be missing or patient reported. Any vitals not documented were not able to be obtained and vitals that have been documented are patient reported.  VideoDeclined- This patient declined Librarian, academic. Therefore the visit was completed with audio only.  AWV Questionnaire: No: Patient Medicare AWV questionnaire was not completed prior to this visit.  Cardiac Risk Factors include: advanced age (>50men, >65 women);dyslipidemia     Objective:    There were no vitals filed for this visit. There is no height or weight on file to calculate BMI.     07/03/2023   10:55 AM 12/31/2022    4:18 PM 10/22/2022    7:51 AM 06/28/2022   12:30 PM 11/01/2021   10:22 AM 09/08/2021    7:23 AM 09/08/2020    6:28 PM  Advanced Directives  Does Patient Have a Medical Advance Directive? No No Yes No No No No  Type of Advance Directive   Living will      Would patient like information on creating a medical advance directive? No - Patient declined    No - Patient declined      Current Medications (verified) Outpatient Encounter Medications as of 07/03/2023  Medication Sig   acetaminophen (TYLENOL) 500 MG tablet Take 1-2 tablets (500-1,000 mg total) by mouth every 8 (eight) hours as needed for moderate pain. OTC   BIOTIN PO Take 1 capsule by mouth daily at 6 (six) AM.   Calcium Carbonate-Vit D-Min (CALCIUM 1200 PO) Take 600  mg by mouth daily.    Cholecalciferol (VITAMIN D) 125 MCG (5000 UT) CAPS Take 1 capsule by mouth daily.   CINNAMON PO Take by mouth.   Cyanocobalamin (VITAMIN B-12) 1000 MCG SUBL Place 1,000 mcg under the tongue daily.    ipratropium (ATROVENT) 0.03 % nasal spray Place 2 sprays into both nostrils every 12 (twelve) hours.   levocetirizine (XYZAL) 5 MG tablet Take 1 tablet (5 mg total) by mouth every evening.   lidocaine (LIDODERM) 5 % Place 1 patch onto the skin daily. As needed for msk pain   meloxicam (MOBIC) 7.5 MG tablet Take 7.5 mg by mouth daily.   metoprolol succinate (TOPROL XL) 25 MG 24 hr tablet Take 1 tablet (25 mg total) by mouth daily with breakfast.   nystatin (MYCOSTATIN) 100000 UNIT/ML suspension Take 5 mLs (500,000 Units total) by mouth 4 (four) times daily.   nystatin (MYCOSTATIN) 100000 UNIT/ML suspension Take 5 mLs (500,000 Units total) by mouth 4 (four) times daily.   Omega-3 Fatty Acids (FISH OIL PO) Take 1 capsule by mouth daily at 6 (six) AM.   pantoprazole (PROTONIX) 40 MG tablet TAKE 1 TABLET (40 MG TOTAL) BY MOUTH DAILY AS NEEDED FOR GERD/ACID REFLUX   REFRESH CELLUVISC 1 % GEL    RESTASIS 0.05 % ophthalmic emulsion    rosuvastatin (CRESTOR) 10 MG tablet TAKE 1 TABLET EVERY DAY (DOSE CHANGE)   TURMERIC PO Take by mouth.   Evolocumab (REPATHA SURECLICK) 140  MG/ML SOAJ Inject 140 mg into the skin every 14 (fourteen) days. (Patient not taking: Reported on 07/03/2023)   tiZANidine (ZANAFLEX) 4 MG tablet Take 1 tablet (4 mg total) by mouth every 8 (eight) hours as needed for muscle spasms (muscle tightness). (Patient not taking: Reported on 07/03/2023)   traMADol (ULTRAM) 50 MG tablet Take 1 tablet (50 mg total) by mouth every 12 (twelve) hours as needed. (Patient not taking: Reported on 07/03/2023)   TYRVAYA 0.03 MG/ACT SOLN  (Patient not taking: Reported on 07/03/2023)   No facility-administered encounter medications on file as of 07/03/2023.    Allergies  (verified) Celecoxib and Flonase [fluticasone]   History: Past Medical History:  Diagnosis Date   Adjustment disorder with mixed anxiety and depressed mood 10/11/2015   Allergy    Anemia    Arthritis    knees, hands   Borderline diabetes mellitus    Cholelithiasis    Complication of anesthesia    slow to wake   Dry eye syndrome of both eyes    Dysphagia 06/12/2020   GERD (gastroesophageal reflux disease)    Hypercholesterolemia    Loss of weight 12/17/2016   Osteoarthritis of left knee 01/07/2023   Pain in joint of left knee 12/14/2020   Palpitation 09/29/2019   Prehypertension 12/17/2016   Wears dentures    full upper and lower   Past Surgical History:  Procedure Laterality Date   ABDOMINAL HYSTERECTOMY     complete   BIOPSY  10/22/2022   Procedure: BIOPSY;  Surgeon: Wyline Mood, MD;  Location[facility] ENDOSCOPY;  Service: Gastroenterology;;   COLONOSCOPY WITH PROPOFOL N/A 05/09/2017   Procedure: COLONOSCOPY WITH PROPOFOL;  Surgeon: Midge Minium, MD;  Location: [facility]GERY [facility];  Service: Endoscopy;  Laterality: N/A;   ESOPHAGEAL BRUSHING  10/22/2022   Procedure: ESOPHAGEAL BRUSHING;  Surgeon: Wyline Mood, MD;  [facility] ENDOSCOPY;  Service: Gastroenterology;;   ESOPHAGOGASTRODUODENOSCOPY (EGD) WITH PROPOFOL N/A 05/30/2020   Procedure: ESOPHAGOGASTRODUODENOSCOPY (EGD) WITH PROPOFOL;  Surgeon: Wyline Mood, MD;  Location: [facility] ENDOSCOPY;  Service: Gastroenterology;  Laterality: N/A;   ESOPHAGOGASTRODUODENOSCOPY (EGD) WITH PROPOFOL N/A 08/07/2020   Procedure: ESOPHAGOGASTRODUODENOSCOPY (EGD) WITH PROPOFOL;  Surgeon: Wyline Mood, MD;  Location: [facility] ENDOSCOPY;  Service: Gastroenterology;  Laterality: N/A;   ESOPHAGOGASTRODUODENOSCOPY (EGD) WITH PROPOFOL N/A 10/22/2022   Procedure: ESOPHAGOGASTRODUODENOSCOPY (EGD) WITH PROPOFOL;  Surgeon: Wyline Mood, MD;  Location: [facility] ENDOSCOPY;  Service: Gastroenterology;  Laterality: N/A;   EYE SURGERY  08/04/2017   KNEE SURGERY Bilateral     Family History  Problem Relation Age of Onset   Hypertension Mother    Stroke Mother        4 mini strokes   Thyroid disease Mother    Kidney failure Father    Diabetes Father    Hypertension Father    Kidney disease Father    Emphysema Maternal Grandmother    Diabetes Maternal Grandmother    Hypertension Maternal Grandmother    Diabetes Maternal Grandfather    Alcohol abuse Maternal Grandfather    Diabetes Paternal Grandmother    Heart attack Paternal Grandmother    Emphysema Paternal Grandfather    Diabetes Paternal Grandfather    Heart disease Paternal Grandfather    Thyroid disease Sister    Vitamin D deficiency Sister    Arthritis Sister    COPD Sister    Fibromyalgia Sister    Hypertension Sister    Hyperlipidemia Sister    Asthma Sister    Thyroid disease Sister    Breast cancer  Other    Cancer Neg Hx    Social History   Socioeconomic History   Marital status: Married    Spouse name: Not on file   Number of children: 3   Years of education: Not on file   Highest education level: High school graduate  Occupational History   Not on file  Tobacco Use   Smoking status: Never   Smokeless tobacco: Never  Vaping Use   Vaping status: Never Used  Substance and Sexual Activity   Alcohol use: No   Drug use: No   Sexual activity: Not Currently  Other Topics Concern   Not on file  Social History Narrative   Not on file   Social Drivers of Health   Financial Resource Strain: Low Risk  (07/03/2023)   Overall Financial Resource Strain (CARDIA)    Difficulty of Paying Living Expenses: Not hard at all  Food Insecurity: No Food Insecurity (07/03/2023)   Hunger Vital Sign    Worried About Running Out of Food in the Last Year: Never true    Ran Out of Food in the Last Year: Never true  Transportation Needs: No Transportation Needs (07/03/2023)   PRAPARE - Administrator, Civil Service (Medical): No    Lack of Transportation (Non-Medical): No   Physical Activity: Sufficiently Active (07/03/2023)   Exercise Vital Sign    Days of Exercise per Week: 7 days    Minutes of Exercise per Session: 150+ min  Stress: No Stress Concern Present (07/03/2023)   [employer] of Occupational Health - Occupational Stress Questionnaire    Feeling of Stress : Only a little  Social Connections: Socially Integrated (07/03/2023)   Social Connection and Isolation Panel [NHANES]    Frequency of Communication with Friends and Family: More than three times a week    Frequency of Social Gatherings with Friends and Family: More than three times a week    Attends Religious Services: More than 4 times per year    Active Member of [employer] or Organizations: Yes    Attends Engineer, structural: More than 4 times per year    Marital Status: Married    Tobacco Counseling Counseling given: Not Answered    Clinical Intake:  Pre-visit preparation completed: Yes  Pain : No/denies pain     BMI - recorded: 19 Nutritional Status: BMI of 19-24  Normal Nutritional Risks: None Diabetes: No  How often do you need to have someone help you when you read instructions, pamphlets, or other written materials from your doctor or pharmacy?: 1 - Never  Interpreter Needed?: No  Information entered by :: Kennedy Bucker, LPN   Activities of Daily Living     07/03/2023   10:55 AM 02/19/2023   10:37 AM  In your present state of health, do you have any difficulty performing the following activities:  Hearing? 0 0  Vision? 0 0  Difficulty concentrating or making decisions? 0 0  Walking or climbing stairs? 0 1  Dressing or bathing? 0 0  Doing errands, shopping? 0 1  Preparing Food and eating ? N   Using the Toilet? N   In the past six months, have you accidently leaked urine? N   Do you have problems with loss of bowel control? N   Managing your Medications? N   Managing your Finances? N   Housekeeping or managing your Housekeeping? N     Patient  Care Team: Sander Radon as PCP - General (Family  Medicine) Jeralyn Ruths, MD as Consulting Physician (Oncology) Dingeldein, Viviann Spare, MD (Ophthalmology)  Indicate any recent Medical Services you may have received from other than [facility] providers in the past year (date may be approximate).     Assessment:   This is a routine wellness examination for Ebony [name].  Hearing/Vision screen Hearing Screening - Comments:: NO AIDS Vision Screening - Comments:: WEARS GLASSES ALL THE TIME- DR.[email or web address]   Goals Addressed             This Visit's Progress    DIET - EAT MORE FRUITS AND VEGETABLES         Depression Screen     07/03/2023   10:52 AM 02/19/2023   10:48 AM 01/01/2023    9:08 AM 09/10/2022   11:13 AM 09/05/2022   11:22 AM 08/12/2022    3:21 PM 06/28/2022   10:48 AM  PHQ 2/9 Scores  PHQ - 2 Score 0 2 2 0 0 0 0  PHQ- 9 Score 0 8 5 0 0 0     Fall Risk     07/03/2023   10:55 AM 02/19/2023   10:37 AM 01/01/2023    9:08 AM 09/10/2022   11:12 AM 09/05/2022   11:22 AM  Fall Risk   Falls in the past year? 0 1 1 1 1   Number falls in past yr: 0 0 0 0 0  Injury with Fall? 0 1 1 1 1   Risk for fall due to : No Fall Risks Impaired balance/gait Impaired balance/gait Impaired balance/gait Impaired balance/gait  Follow up Falls prevention discussed;Falls evaluation completed Education provided;Falls evaluation completed;Falls prevention discussed Falls prevention discussed;Education provided;Falls evaluation completed Falls prevention discussed;Education provided;Falls evaluation completed Falls prevention discussed;Education provided;Falls evaluation completed    MEDICARE RISK AT HOME:  Medicare Risk at Home Any stairs in or around the home?: Yes If so, are there any without handrails?: No Home free of loose throw rugs in walkways, pet beds, electrical cords, etc?: Yes Adequate lighting in your home to reduce risk of falls?: Yes Life alert?: No Use of a cane, walker or w/c?: No Grab  bars in the bathroom?: Yes Shower chair or bench in shower?: Yes Elevated toilet seat or a handicapped toilet?: Yes  TIMED UP AND GO:  Was the test performed?  No  Cognitive Function: 6CIT completed        07/03/2023   10:57 AM 06/28/2022   12:30 PM 05/04/2019   10:27 AM 04/24/2018   11:29 AM 02/04/2017   12:17 PM  6CIT Screen  What Year? 0 points 0 points 0 points 0 points 0 points  What month? 0 points 0 points 0 points 0 points 0 points  What time? 0 points 0 points 0 points 0 points 0 points  Count back from 20 0 points 0 points 0 points 0 points 0 points  Months in reverse 0 points 0 points 0 points 0 points 0 points  Repeat phrase 0 points 0 points 2 points 2 points 2 points  Total Score 0 points 0 points 2 points 2 points 2 points    Immunizations Immunization History  Administered Date(s) Administered   PFIZER Comirnaty(Gray Top)Covid-19 Tri-Sucrose Vaccine 03/02/2020   PFIZER(Purple Top)SARS-COV-2 Vaccination 07/10/2019, 07/31/2019, 03/07/2022    Screening Tests Health Maintenance  Topic Date Due   DTaP/Tdap/Td (1 - Tdap) Never done   Zoster Vaccines- Shingrix (1 of 2) Never done   MAMMOGRAM  02/22/2021   DEXA SCAN  02/22/2022   COVID-19 Vaccine (5 -  2024-25 season) 01/05/2023   INFLUENZA VACCINE  08/04/2023 (Originally 12/05/2022)   Pneumonia Vaccine 50+ Years old (1 of 1 - PCV) 02/19/2024 (Originally 09/19/2014)   Medicare Annual Wellness (AWV)  07/02/2024   Colonoscopy  05/10/2027   Hepatitis C Screening  Completed   HPV VACCINES  Aged Out    Health Maintenance  Health Maintenance Due  Topic Date Due   DTaP/Tdap/Td (1 - Tdap) Never done   Zoster Vaccines- Shingrix (1 of 2) Never done   MAMMOGRAM  02/22/2021   DEXA SCAN  02/22/2022   COVID-19 Vaccine (5 - 2024-25 season) 01/05/2023   Health Maintenance Items Addressed: MAMMOGRAM AND BONE DENSITY SCAN REFERRALS SENT  Additional Screening:  Vision Screening: Recommended annual ophthalmology exams  for early detection of glaucoma and other disorders of the eye.  Dental Screening: Recommended annual dental exams for proper oral hygiene  Community Resource Referral / Chronic Care Management: CRR required this visit?  No   CCM required this visit?  No     Plan:     I have personally reviewed and noted the following in the patient's chart:   Medical and social history Use of alcohol, tobacco or illicit drugs  Current medications and supplements including opioid prescriptions. Patient is currently taking opioid prescriptions. Information provided to patient regarding non-opioid alternatives. Patient advised to discuss non-opioid treatment plan with their provider. Functional ability and status Nutritional status Physical activity Advanced directives List of other physicians Hospitalizations, surgeries, and ER visits in previous 12 months Vitals Screenings to include cognitive, depression, and falls Referrals and appointments  In addition, I have reviewed and discussed with patient certain preventive protocols, quality metrics, and best practice recommendations. A written personalized care plan for preventive services as well as general preventive health recommendations were provided to patient.     Hal Hope, LPN   09/05/7739   After Visit Summary: (MyChart) Due to this being a telephonic visit, the after visit summary with patients personalized plan was offered to patient via MyChart   Notes:  REFERRALS SENT FOR BONE DENSITY SCAN & MAMMOGRAM

## 2023-07-03 NOTE — Patient Instructions (Addendum)
 Ebony [name] , Thank you for taking time to come for your Medicare Wellness Visit. I appreciate your ongoing commitment to your health goals. Please review the following plan we discussed and let me know if I can assist you in the future.   Referrals/Orders/Follow-Ups/Clinician Recommendations: REFERRALS SENT FOR MAMMOGRAM AND BONE DENSITY SCAN  You have an order for:  []   2D Mammogram  [x]   3D Mammogram  [x]   Bone Density     Please call for appointment:  [facility]  [address] [place] [address] [phone]  [facility] [address] [place], [place] [address] [phone]  [facility] [address]. [place], [place] [address] [phone]    Make sure to wear two-piece clothing.  No lotions, powders, or deodorants the day of the appointment. Make sure to bring picture ID and insurance card.  Bring list of medications you are currently taking including any supplements.   Schedule your [place] screening mammogram through MyChart!   Log into your MyChart account.  Go to 'Visit' (or 'Appointments' if on mobile App) --> Schedule an Appointment  Under 'Select a Reason for Visit' choose the Mammogram Screening option.  Complete the pre-visit questions and select the time and place that best fits your schedule.    This is a list of the screening recommended for you and due dates:  Health Maintenance  Topic Date Due   DTaP/Tdap/Td vaccine (1 - Tdap) Never done   Zoster (Shingles) Vaccine (1 of 2) Never done   Mammogram  02/22/2021   DEXA scan (bone density measurement)  02/22/2022   COVID-19 Vaccine (5 - 2024-25 season) 01/05/2023   Flu Shot  08/04/2023*   Pneumonia Vaccine (1 of 1 - PCV) 02/19/2024*   Medicare Annual Wellness Visit  07/02/2024   Colon Cancer Screening  05/10/2027   Hepatitis C Screening  Completed   HPV  Vaccine  Aged Out  *Topic was postponed. The date shown is not the original due date.    Advanced directives: (ACP Link)Information on Advanced Care Planning can be found at [facility] Advance Health Care Directives Advance Health Care Directives ([email or web address]   Next Medicare Annual Wellness Visit scheduled for next year: Yes   07/08/24 @ 9:30 AM BY PHONE

## 2023-07-10 NOTE — Progress Notes (Signed)
 Ebony Amy, PA-C [address]  [address]  [place], [place] [address]  Main: [phone]  Fax: [phone]   Primary Care Physician: Ebony Berry, PA-C  Primary Gastroenterologist:  Ebony Amy, PA-C   CC: Recurrent Candida Esophagitis  HPI: Ebony [name] is a 73 y.o. female returns for follow-up of recurrent esophageal candidiasis.  Patient states she is having recurrent solid food dysphagia for 1 month.  It feels like when she has had esophageal candidiasis in the past.  Food is not going down well.  She is eating soft foods.  She denies any antibiotic or steroid use in the past year.  Flonase was discontinued a year ago.  Her weight is up 8 pounds in the past 4 months.  She has been treated for esophageal candidiasis multiple times since 2022.  Most recently took nystatin suspension 1 month ago with little benefit.  06/16/2023: Rx nystatin suspension 5 mL 4 times daily. 05/19/2023: Refilled pantoprazole 40 Mg once daily. 01/20/2023: Rx Diflucan 200 mg once daily for 14 days. 10/28/2022 Rx Diflucan 200 Mg once daily for 14 days. 09/23/2022 Rx nystatin suspension 5 mL 4 times daily Last antibiotic 07/2022: Keflex 500 Mg 3 times daily x 7 days.  HIV tests negative in 2020 and 2017.  Last labs 12/2022.  08/2022: A1c 6.3.  Last EGD 10/2022 by Dr. Tobi [name]: Abnormal esophageal motility.  Decreased esophageal motility.  Distal esophagus/lower esophageal sphincter is open.  Diffuse white plaques found in the entire esophagus.  Normal stomach and duodenum.  Esophageal biopsies positive for candidiasis and erosive esophagitis.  Biopsies negative for Barrett's,, virus, dysplasia, or malignancy.  She was treated with fluconazole 200 mg 1 tablet daily for 14 days.  EGD 08/2020 by Dr. Tobi [name] showed evidence of esophageal candidiasis in the middle third of the esophagus.  KOH prep positive for yeast.  No stricture.  Normal stomach and duodenum.  Treated with Diflucan.    EGD 05/2020 showed diffuse  white plaques in the entire esophagus consistent with esophageal candidiasis.  Biopsies confirmed acute candidal esophagitis.  Treated with Diflucan.   Colonoscopy 05/2017 by Dr. Servando [name] showed fair prep, nonbleeding internal hemorrhoids, and no polyps.  Repeat in 10 years.    Current Outpatient Medications  Medication Sig Dispense Refill   acetaminophen (TYLENOL) 500 MG tablet Take 1-2 tablets (500-1,000 mg total) by mouth every 8 (eight) hours as needed for moderate pain. OTC 30 tablet 0   BIOTIN PO Take 1 capsule by mouth daily at 6 (six) AM.     Calcium Carbonate-Vit D-Min (CALCIUM 1200 PO) Take 600 mg by mouth daily.      Cholecalciferol (VITAMIN D) 125 MCG (5000 UT) CAPS Take 1 capsule by mouth daily.     CINNAMON PO Take by mouth.     Cyanocobalamin (VITAMIN B-12) 1000 MCG SUBL Place 1,000 mcg under the tongue daily.      Evolocumab (REPATHA SURECLICK) 140 MG/ML SOAJ Inject 140 mg into the skin every 14 (fourteen) days. 6 mL 3   fluconazole (DIFLUCAN) 200 MG tablet Take 1 tablet (200 mg total) by mouth 2 (two) times daily for 14 days. 28 tablet 0   ipratropium (ATROVENT) 0.03 % nasal spray Place 2 sprays into both nostrils every 12 (twelve) hours. 30 mL 12   levocetirizine (XYZAL) 5 MG tablet Take 1 tablet (5 mg total) by mouth every evening. 30 tablet 11   lidocaine (LIDODERM) 5 % Place 1 patch onto the skin daily. As needed for  msk pain 30 patch 2   meloxicam (MOBIC) 7.5 MG tablet Take 7.5 mg by mouth daily.     metoprolol succinate (TOPROL XL) 25 MG 24 hr tablet Take 1 tablet (25 mg total) by mouth daily with breakfast. 90 tablet 3   nystatin (MYCOSTATIN) 100000 UNIT/ML suspension Take 5 mLs (500,000 Units total) by mouth 4 (four) times daily. 60 mL 0   nystatin (MYCOSTATIN) 100000 UNIT/ML suspension Take 5 mLs (500,000 Units total) by mouth 4 (four) times daily. 60 mL 0   Omega-3 Fatty Acids (FISH OIL PO) Take 1 capsule by mouth daily at 6 (six) AM.     pantoprazole (PROTONIX) 40 MG  tablet TAKE 1 TABLET (40 MG TOTAL) BY MOUTH DAILY AS NEEDED FOR GERD/ACID REFLUX 90 tablet 0   REFRESH CELLUVISC 1 % GEL      RESTASIS 0.05 % ophthalmic emulsion      rosuvastatin (CRESTOR) 10 MG tablet TAKE 1 TABLET EVERY DAY (DOSE CHANGE) 90 tablet 3   tiZANidine (ZANAFLEX) 4 MG tablet Take 1 tablet (4 mg total) by mouth every 8 (eight) hours as needed for muscle spasms (muscle tightness). 30 tablet 0   traMADol (ULTRAM) 50 MG tablet Take 1 tablet (50 mg total) by mouth every 12 (twelve) hours as needed. 10 tablet 0   TURMERIC PO Take by mouth.     TYRVAYA 0.03 MG/ACT SOLN      No current facility-administered medications for this visit.    Allergies as of 07/11/2023 - Review Complete 07/11/2023  Allergen Reaction Noted   Celecoxib Anxiety, Itching, and Other (See Comments) 07/11/2015   Flonase [fluticasone] Other (See Comments) 09/24/2022    Past Medical History:  Diagnosis Date   Adjustment disorder with mixed anxiety and depressed mood 10/11/2015   Allergy    Anemia    Arthritis    knees, hands   Borderline diabetes mellitus    Cholelithiasis    Complication of anesthesia    slow to wake   Dry eye syndrome of both eyes    Dysphagia 06/12/2020   GERD (gastroesophageal reflux disease)    Hypercholesterolemia    Loss of weight 12/17/2016   Osteoarthritis of left knee 01/07/2023   Pain in joint of left knee 12/14/2020   Palpitation 09/29/2019   Prehypertension 12/17/2016   Wears dentures    full upper and lower    Past Surgical History:  Procedure Laterality Date   ABDOMINAL HYSTERECTOMY     complete   BIOPSY  10/22/2022   Procedure: BIOPSY;  Surgeon: Wyline Mood, MD;  Location: [facility] ENDOSCOPY;  Service: Gastroenterology;;   COLONOSCOPY WITH PROPOFOL N/A 05/09/2017   Procedure: COLONOSCOPY WITH PROPOFOL;  Surgeon: Midge Minium, MD;  Location: [facility];  Service: Endoscopy;  Laterality: N/A;   ESOPHAGEAL BRUSHING  10/22/2022   Procedure: ESOPHAGEAL BRUSHING;   Surgeon: Wyline Mood, MD;  Location: [facility] ENDOSCOPY;  Service: Gastroenterology;;   ESOPHAGOGASTRODUODENOSCOPY (EGD) WITH PROPOFOL N/A 05/30/2020   Procedure: ESOPHAGOGASTRODUODENOSCOPY (EGD) WITH PROPOFOL;  Surgeon: Wyline Mood, MD;  Location: [facility] ENDOSCOPY;  Service: Gastroenterology;  Laterality: N/A;   ESOPHAGOGASTRODUODENOSCOPY (EGD) WITH PROPOFOL N/A 08/07/2020   Procedure: ESOPHAGOGASTRODUODENOSCOPY (EGD) WITH PROPOFOL;  Surgeon: Wyline Mood, MD;  Location: [facility] ENDOSCOPY;  Service: Gastroenterology;  Laterality: N/A;   ESOPHAGOGASTRODUODENOSCOPY (EGD) WITH PROPOFOL N/A 10/22/2022   Procedure: ESOPHAGOGASTRODUODENOSCOPY (EGD) WITH PROPOFOL;  Surgeon: Wyline Mood, MD;  Location: [facility] ENDOSCOPY;  Service: Gastroenterology;  Laterality: N/A;   EYE SURGERY  08/04/2017   KNEE SURGERY Bilateral  Review of Systems:    All systems reviewed and negative except where noted in HPI.   Physical Examination:   BP 135/84   Pulse 68   Temp 97.7 F (36.5 C)   Ht 5\' 7"  (1.702 m)   Wt 129 lb (58.5 kg)   BMI 20.20 kg/m   General: Well-nourished, well-developed in no acute distress.  Mouth: Upper and lower dentures in place.  Mild redness in the posterior oropharynx.  No evidence of thrush.  Tonsils are surgically absent.  No masses. Neck: Tender, Mildly enlarged bilateral anterior cervical lymph nodes (1 on right neck and 1 on left neck).  No neck masses or thyromegaly. Lungs: Clear to auscultation bilaterally. Non-labored. Heart: Regular rate and rhythm, no murmurs rubs or gallops.  Abdomen: Bowel sounds are normal; Abdomen is Soft; No hepatosplenomegaly, masses or hernias;  No Abdominal Tenderness; No guarding or rebound tenderness. Neuro: Alert and oriented x 3.  Grossly intact.  Psych: Alert and cooperative, normal mood and affect.  Imaging Studies: No results found.  Assessment and Plan:   Ebony [name] is a 73 y.o. y/o female presents for recurrent solid food dysphagia for 1 month,  consistent with recurrent esophageal candidiasis.  She has had multiple recurrent episodes since 2022, uncertain etiology.  No recent antibiotics or steroids in the past year.  Flonase was discontinued over a year ago.  1.  Recurrent esophageal candidiasis (Since 2022)  Labs: HIV, CBC, CMP, hemoglobin A1C  Avoid steroids and antibiotics (She has not been on any in over a year).  Rx Diflucan 200mg  1 tablet twice daily x 14 days, #28, No refills.  She may need Referal to [facility] or [facility] specialist for Refractory Infection if continues to recur.  Follow-up with Dr. Tobi [name] in 1 month to decide about scheduling repeat EGD.  2.  GERD with Erosive esophagitis  Continue pantoprazole 40 Mg once daily.  Avoid GERD trigger foods and drinks.  3.  Esophageal Dysmotility / Patulous Esophagus  3.  Mild tender bilateral anterior cervical lymphadenopathy - Likely due to recurrent esophageal candidiasis.  Re-check in 1 month after treatment.  If persists, will need f/u with PCP and neck CT.  Note: Chest CTs 12/2022 and 04/2023 showed no acute abnormality - NO mediastinal lymphadenopathy.  Ebony Amy, PA-C  Follow up with Dr. Tobi [name] in 1 month to decide about repeat EGD.

## 2023-07-11 ENCOUNTER — Ambulatory Visit (INDEPENDENT_AMBULATORY_CARE_PROVIDER_SITE_OTHER): Payer: Medicare HMO | Admitting: Physician Assistant

## 2023-07-11 ENCOUNTER — Ambulatory Visit: Admitting: Gastroenterology

## 2023-07-11 ENCOUNTER — Encounter: Payer: Self-pay | Admitting: Physician Assistant

## 2023-07-11 VITALS — BP 135/84 | HR 68 | Temp 97.7°F | Ht 67.0 in | Wt 129.0 lb

## 2023-07-11 DIAGNOSIS — K2100 Gastro-esophageal reflux disease with esophagitis, without bleeding: Secondary | ICD-10-CM

## 2023-07-11 DIAGNOSIS — B3781 Candidal esophagitis: Secondary | ICD-10-CM | POA: Diagnosis not present

## 2023-07-11 DIAGNOSIS — R590 Localized enlarged lymph nodes: Secondary | ICD-10-CM

## 2023-07-11 DIAGNOSIS — K222 Esophageal obstruction: Secondary | ICD-10-CM | POA: Diagnosis not present

## 2023-07-11 DIAGNOSIS — K219 Gastro-esophageal reflux disease without esophagitis: Secondary | ICD-10-CM

## 2023-07-11 DIAGNOSIS — R1310 Dysphagia, unspecified: Secondary | ICD-10-CM

## 2023-07-11 MED ORDER — FLUCONAZOLE 200 MG PO TABS: 200.0000 mg | ORAL_TABLET | Freq: Two times a day (BID) | ORAL | 0 refills | Status: AC

## 2023-07-12 LAB — COMPREHENSIVE METABOLIC PANEL
ALT: 8 IU/L (ref 0–32)
AST: 14 IU/L (ref 0–40)
Albumin: 4.1 g/dL (ref 3.8–4.8)
Alkaline Phosphatase: 87 IU/L (ref 44–121)
BUN/Creatinine Ratio: 23 (ref 12–28)
BUN: 13 mg/dL (ref 8–27)
Bilirubin Total: 0.6 mg/dL (ref 0.0–1.2)
CO2: 25 mmol/L (ref 20–29)
Calcium: 9.5 mg/dL (ref 8.7–10.3)
Chloride: 106 mmol/L (ref 96–106)
Creatinine, Ser: 0.56 mg/dL — ABNORMAL LOW (ref 0.57–1.00)
Globulin, Total: 2.5 g/dL (ref 1.5–4.5)
Glucose: 72 mg/dL (ref 70–99)
Potassium: 4.3 mmol/L (ref 3.5–5.2)
Sodium: 143 mmol/L (ref 134–144)
Total Protein: 6.6 g/dL (ref 6.0–8.5)
eGFR: 96 mL/min/{1.73_m2} (ref >59)

## 2023-07-12 LAB — CBC WITH DIFFERENTIAL/PLATELET
Basophils Absolute: 0.1 10*3/uL (ref 0.0–0.2)
Basos: 1 %
EOS (ABSOLUTE): 0 10*3/uL (ref 0.0–0.4)
Eos: 0 %
Hematocrit: 41.1 % (ref 34.0–46.6)
Hemoglobin: 13.2 g/dL (ref 11.1–15.9)
Immature Grans (Abs): 0 10*3/uL (ref 0.0–0.1)
Immature Granulocytes: 0 %
Lymphocytes Absolute: 1.5 10*3/uL (ref 0.7–3.1)
Lymphs: 33 %
MCH: 28.8 pg (ref 26.6–33.0)
MCHC: 32.1 g/dL (ref 31.5–35.7)
MCV: 90 fL (ref 79–97)
Monocytes Absolute: 0.5 10*3/uL (ref 0.1–0.9)
Monocytes: 10 %
Neutrophils Absolute: 2.6 10*3/uL (ref 1.4–7.0)
Neutrophils: 56 %
Platelets: 204 10*3/uL (ref 150–450)
RBC: 4.58 x10E6/uL (ref 3.77–5.28)
RDW: 12.7 % (ref 11.7–15.4)
WBC: 4.7 10*3/uL (ref 3.4–10.8)

## 2023-07-12 LAB — HIV ANTIBODY (ROUTINE TESTING W REFLEX): HIV Screen 4th Generation wRfx: NONREACTIVE

## 2023-07-12 LAB — HEMOGLOBIN A1C
Est. average glucose Bld gHb Est-mCnc: 134 mg/dL
Hgb A1c MFr Bld: 6.3 % — ABNORMAL HIGH (ref 4.8–5.6)

## 2023-07-14 ENCOUNTER — Telehealth: Payer: Self-pay

## 2023-07-14 NOTE — Telephone Encounter (Signed)
 Left message for patient to return call to office.  Call and notify patient her labs show:  1.  Normal blood sugar, kidney test, liver test, and electrolytes.  2.  Hemoglobin A1c 6.3, is in prediabetes range, however It is stable and unchanged from 1 year ago.  3.  Normal CBC.  Normal white count and hemoglobin.  No evidence of infection or anemia.  4.  HIV test is negative.  Continue with current plan to take Fluconazole to treat esophageal yeast infection and keep follow-up appointment with Dr. Tobi Bastos as scheduled.  Let us know if her dysphagia symptoms are not improving.  Celso Amy, PA-C

## 2023-07-15 ENCOUNTER — Telehealth: Payer: Self-pay

## 2023-07-15 NOTE — Telephone Encounter (Signed)
 Spoke with patient- she asked about her A1c and was instructed to follow up with PCP.   Call and notify patient her labs show:  1.  Normal blood sugar, kidney test, liver test, and electrolytes.  2.  Hemoglobin A1c 6.3, is in prediabetes range, however It is stable and unchanged from 1 year ago.  3.  Normal CBC.  Normal white count and hemoglobin.  No evidence of infection or anemia.  4.  HIV test is negative.  Continue with current plan to take Fluconazole to treat esophageal yeast infection and keep follow-up appointment with Dr. Tobi Bastos as scheduled.  Let us know if her dysphagia symptoms are not improving.  Celso Amy, PA-C

## 2023-08-01 ENCOUNTER — Other Ambulatory Visit: Payer: Self-pay | Admitting: Family Medicine

## 2023-08-01 DIAGNOSIS — K219 Gastro-esophageal reflux disease without esophagitis: Secondary | ICD-10-CM

## 2023-08-08 ENCOUNTER — Other Ambulatory Visit: Payer: Self-pay

## 2023-08-11 ENCOUNTER — Ambulatory Visit: Admitting: Gastroenterology

## 2023-08-11 NOTE — Progress Notes (Deleted)
 Wyline Mood MD, MRCP(U.K) [address]  [address]  [place], [place] [address]  Main: [phone]  Fax: [phone]   Primary Care Physician: Danelle Berry, PA-C  Primary Gastroenterologist:  Dr. Wyline Mood   No chief complaint on file.   HPI: Ebony [name] is a 73 y.o. female    Summary of history :  Ebony [name] is a 73 y.o. female returns for follow-up of recurrent esophageal candidiasis.  At her last visit she was having dysphagia for 1 month.  Flonase was discontinued a year ago.  Her weight is up 8 pounds in the past 4 months.  She has been treated for esophageal candidiasis multiple times since 2022.  Most recently took nystatin suspension 1 month ago with little benefit.   06/16/2023: Rx nystatin suspension 5 mL 4 times daily. 05/19/2023: Refilled pantoprazole 40 Mg once daily. 01/20/2023: Rx Diflucan 200 mg once daily for 14 days. 10/28/2022 Rx Diflucan 200 Mg once daily for 14 days. 09/23/2022 Rx nystatin suspension 5 mL 4 times daily Last antibiotic 07/2022: Keflex 500 Mg 3 times daily x 7 days.   HIV tests negative in 2020 and 2017.  Last labs 12/2022.  08/2022: A1c 6.3.   Last EGD 10/2022 by Dr. Tobi Bastos: Abnormal esophageal motility.  Decreased esophageal motility.  Distal esophagus/lower esophageal sphincter is open.  Diffuse white plaques found in the entire esophagus.  Normal stomach and duodenum.  Esophageal biopsies positive for candidiasis and erosive esophagitis.  Biopsies negative for Barrett's,, virus, dysplasia, or malignancy.  She was treated with fluconazole 200 mg 1 tablet daily for 14 days.   EGD 08/2020 by Dr. Tobi Bastos showed evidence of esophageal candidiasis in the middle third of the esophagus.  KOH prep positive for yeast.  No stricture.  Normal stomach and duodenum.  Treated with Diflucan.    EGD 05/2020 showed diffuse white plaques in the entire esophagus consistent with esophageal candidiasis.  Biopsies confirmed acute candidal esophagitis.  Treated  with Diflucan.   Colonoscopy 05/2017 by Dr. Servando Snare showed fair prep, nonbleeding internal hemorrhoids, and no polyps.  Repeat in 10 years.  Interval history 07/11/2023     -08/11/2023  07/11/2022 hemoglobin 13.2, HbA1c 6.3, HIV negative, CMP and CBC were normal.    ***   Current Outpatient Medications  Medication Sig Dispense Refill   acetaminophen (TYLENOL) 500 MG tablet Take 1-2 tablets (500-1,000 mg total) by mouth every 8 (eight) hours as needed for moderate pain. OTC 30 tablet 0   BIOTIN PO Take 1 capsule by mouth daily at 6 (six) AM.     Calcium Carbonate-Vit D-Min (CALCIUM 1200 PO) Take 600 mg by mouth daily.      Cholecalciferol (VITAMIN D) 125 MCG (5000 UT) CAPS Take 1 capsule by mouth daily.     CINNAMON PO Take by mouth.     Cyanocobalamin (VITAMIN B-12) 1000 MCG SUBL Place 1,000 mcg under the tongue daily.      Evolocumab (REPATHA SURECLICK) 140 MG/ML SOAJ Inject 140 mg into the skin every 14 (fourteen) days. 6 mL 3   ipratropium (ATROVENT) 0.03 % nasal spray Place 2 sprays into both nostrils every 12 (twelve) hours. 30 mL 12   levocetirizine (XYZAL) 5 MG tablet Take 1 tablet (5 mg total) by mouth every evening. 30 tablet 11   lidocaine (LIDODERM) 5 % Place 1 patch onto the skin daily. As needed for msk pain 30 patch 2   meloxicam (MOBIC) 7.5 MG tablet Take 7.5 mg by  mouth daily.     methylPREDNISolone (MEDROL PO) Take as instructed for 6 days     metoprolol succinate (TOPROL XL) 25 MG 24 hr tablet Take 1 tablet (25 mg total) by mouth daily with breakfast. 90 tablet 3   nystatin (MYCOSTATIN) 100000 UNIT/ML suspension Take 5 mLs (500,000 Units total) by mouth 4 (four) times daily. 60 mL 0   nystatin (MYCOSTATIN) 100000 UNIT/ML suspension Take 5 mLs (500,000 Units total) by mouth 4 (four) times daily. 60 mL 0   Omega-3 Fatty Acids (FISH OIL PO) Take 1 capsule by mouth daily at 6 (six) AM.     pantoprazole (PROTONIX) 40 MG tablet TAKE 1 TABLET (40 MG TOTAL) BY MOUTH DAILY AS NEEDED FOR  GERD/ACID REFLUX 90 tablet 3   REFRESH CELLUVISC 1 % GEL      RESTASIS 0.05 % ophthalmic emulsion      rosuvastatin (CRESTOR) 10 MG tablet TAKE 1 TABLET EVERY DAY (DOSE CHANGE) 90 tablet 3   tiZANidine (ZANAFLEX) 4 MG tablet Take 1 tablet (4 mg total) by mouth every 8 (eight) hours as needed for muscle spasms (muscle tightness). 30 tablet 0   traMADol (ULTRAM) 50 MG tablet Take 1 tablet (50 mg total) by mouth every 12 (twelve) hours as needed. 10 tablet 0   TURMERIC PO Take by mouth.     TYRVAYA 0.03 MG/ACT SOLN      No current facility-administered medications for this visit.    Allergies as of 08/11/2023 - Review Complete 07/11/2023  Allergen Reaction Noted   Celecoxib Anxiety, Itching, and Other (See Comments) 07/11/2015   Flonase [fluticasone] Other (See Comments) 09/24/2022      ROS:  General: Negative for anorexia, weight loss, fever, chills, fatigue, weakness. ENT: Negative for hoarseness, difficulty swallowing , nasal congestion. CV: Negative for chest pain, angina, palpitations, dyspnea on exertion, peripheral edema.  Respiratory: Negative for dyspnea at rest, dyspnea on exertion, cough, sputum, wheezing.  GI: See history of present illness. GU:  Negative for dysuria, hematuria, urinary incontinence, urinary frequency, nocturnal urination.  Endo: Negative for unusual weight change.    Physical Examination:   There were no vitals taken for this visit.  General: Well-nourished, well-developed in no acute distress.  Eyes: No icterus. Conjunctivae pink. Mouth: Oropharyngeal mucosa moist and pink , no lesions erythema or exudate. Lungs: Clear to auscultation bilaterally. Non-labored. Heart: Regular rate and rhythm, no murmurs rubs or gallops.  Abdomen: Bowel sounds are normal, nontender, nondistended, no hepatosplenomegaly or masses, no abdominal bruits or hernia , no rebound or guarding.   Extremities: No lower extremity edema. No clubbing or deformities. Neuro: Alert  and oriented x 3.  Grossly intact. Skin: Warm and dry, no jaundice.   Psych: Alert and cooperative, normal mood and affect.   Imaging Studies: No results found.  Assessment and Plan:   Ebony [name] is a 74 y.o. y/o history of recurrent esophageal candidiasis and multiple episodes since 2022.  Esophageal dysmotility noted on endoscopy evaluation negative for HIV.  HbA1c elevated.  Treated multiple times with nystatin and Diflucan.  It is possible that the candidiasis is due to esophageal dysmotility and stasis.  Limited options except sitting upright having small meals more often avoid eating for 2 to 3 hours before bedtime tight glycemic control.  I could refer her to infectious disease to find out if there is anything we can do to prevent candidiasis such as a prophylactic medications.   3.  Mild tender bilateral anterior cervical lymphadenopathy -  Likely due to recurrent esophageal candidiasis.             Re-check in 1 month after treatment.  If persists, will need f/u with PCP and neck CT.             Note: Chest CTs 12/2022 and 04/2023 showed no acute abnormality - NO mediastinal lymphadenopathy.    Dr Wyline Mood  MD,MRCP [facility]) Follow up in ***

## 2023-10-25 ENCOUNTER — Emergency Department
Admission: EM | Admit: 2023-10-25 | Discharge: 2023-10-25 | Disposition: A | Discharge status: Home / Self Care | Attending: Emergency Medicine | Admitting: Emergency Medicine

## 2023-10-25 ENCOUNTER — Other Ambulatory Visit: Payer: Self-pay

## 2023-10-25 DIAGNOSIS — Y93H2 Activity, gardening and landscaping: Secondary | ICD-10-CM | POA: Diagnosis not present

## 2023-10-25 DIAGNOSIS — X500XXA Overexertion from strenuous movement or load, initial encounter: Secondary | ICD-10-CM | POA: Diagnosis not present

## 2023-10-25 DIAGNOSIS — S39012A Strain of muscle, fascia and tendon of lower back, initial encounter: Secondary | ICD-10-CM

## 2023-10-25 DIAGNOSIS — M545 Low back pain, unspecified: Secondary | ICD-10-CM | POA: Insufficient documentation

## 2023-10-25 MED ORDER — TRAMADOL HCL 50 MG PO TABS
50.0000 mg | ORAL_TABLET | Freq: Four times a day (QID) | ORAL | 0 refills | Status: AC | PRN
Start: 2023-10-25 — End: ?

## 2023-10-25 NOTE — Discharge Instructions (Addendum)
 Please call the number provided for physiatry to arrange a follow-up appointment for further evaluation should you continue to have lower back pain.  As we discussed you may use Tylenol  as written on the box.  Please limit to less than 4 g in any 24-hour period.  You have been prescribed tramadol  to be used if needed for more significant pain.  Do not drink alcohol or drive while taking tramadol .  Return to the emergency department for any worsening back pain, any numbness of either leg or any loss of control of your bladder or bowels.  Otherwise please follow-up with your doctor for recheck.

## 2023-10-25 NOTE — ED Notes (Signed)
 Pt's family concerned about medication pt is being discharged on. Requested to speak to MD

## 2023-10-25 NOTE — ED Provider Notes (Signed)
 [facility] Provider Note    Event Date/Time   First MD Initiated Contact with Patient 10/25/23 1248     (approximate)  History   Chief Complaint: Back Pain  HPI  Ebony [name] is a 73 y.o. female with a past medical history of anemia, arthritis, gastric reflux, presents to the emergency department for back pain.  According to the patient she states she was cleaning and moving a lot of things yesterday, thinks she overdid it.  She states starting last night she began having some back pain that worsened overnight and again this morning.  Patient took Tylenol  this morning and was feeling somewhat better however she states as the day progressed she was having increased pain so she came to the emergency department for evaluation.  Patient denies any falls.  No weakness or numbness of the legs no incontinence.  Physical Exam   Triage Vital Signs: ED Triage Vitals  Encounter Vitals Group     BP 10/25/23 1244 136/83     Girls Systolic BP Percentile --      Girls Diastolic BP Percentile --      Boys Systolic BP Percentile --      Boys Diastolic BP Percentile --      Pulse Rate 10/25/23 1244 72     Resp 10/25/23 1244 16     Temp 10/25/23 1244 98 F (36.7 C)     Temp Source 10/25/23 1244 Oral     SpO2 10/25/23 1244 100 %     Weight 10/25/23 1245 121 lb (54.9 kg)     Height 10/25/23 1245 5' 6 (1.676 m)     Head Circumference --      Peak Flow --      Pain Score 10/25/23 1243 9     Pain Loc --      Pain Education --      Exclude from Growth Chart --     Most recent vital signs: Vitals:   10/25/23 1244 10/25/23 1252  BP: 136/83   Pulse: 72   Resp: 16   Temp: 98 F (36.7 C)   SpO2: 100% 100%    General: Awake, no distress.  CV:  Good peripheral perfusion. Resp:  Normal effort.  Abd:  No distention.  Other:  Mild to moderate tenderness along the muscles of the lumbar paraspinal region.  No step-off deformity or bruising noted.   ED Results /  Procedures / Treatments   MEDICATIONS ORDERED IN ED: Medications - No data to display   IMPRESSION / MDM / ASSESSMENT AND PLAN / ED COURSE  I reviewed the triage vital signs and the nursing notes.  Patient's presentation is most consistent with acute presentation with potential threat to life or bodily function.  Patient presents emergency department for worsening lower back pain.  Patient states that history of lower back pain in the past but it has been several years since she has had issues.  Patient states she was moving a lot of things yesterday.  Today she is having worsening pain in the lower back.  No red flags such as weakness numbness or incontinence.  Highly suspect more musculoskeletal pain.  Discussed with the patient Tylenol  to be used as written on the box less than 4 g/day.  Will prescribe tramadol  to be used in addition to this.  Will refer to physiatry for further evaluation should the patient continue to have discomfort.  Patient agreeable to plan.  FINAL CLINICAL IMPRESSION(S) /  ED DIAGNOSES   Low back pain  Rx / DC Orders   Tramadol   Note:  This document was prepared using Dragon voice recognition software and may include unintentional dictation errors.   Dorothyann Drivers, MD 10/25/23 1304

## 2023-10-25 NOTE — ED Triage Notes (Signed)
 Pt to ED with grandson for lower back pain since yesterday after doing lots of gardening. Pt alert, oriented. Does not use cane or walker.

## 2023-11-11 ENCOUNTER — Ambulatory Visit (INDEPENDENT_AMBULATORY_CARE_PROVIDER_SITE_OTHER): Admitting: Family Medicine

## 2023-11-11 ENCOUNTER — Encounter: Payer: Self-pay | Admitting: Family Medicine

## 2023-11-11 VITALS — BP 122/76 | HR 69 | Resp 16 | Ht 66.0 in | Wt 110.0 lb

## 2023-11-11 DIAGNOSIS — B370 Candidal stomatitis: Secondary | ICD-10-CM

## 2023-11-11 DIAGNOSIS — R634 Abnormal weight loss: Secondary | ICD-10-CM | POA: Diagnosis not present

## 2023-11-11 DIAGNOSIS — F4329 Adjustment disorder with other symptoms: Secondary | ICD-10-CM | POA: Diagnosis not present

## 2023-11-11 DIAGNOSIS — I471 Supraventricular tachycardia, unspecified: Secondary | ICD-10-CM | POA: Diagnosis not present

## 2023-11-11 DIAGNOSIS — R630 Anorexia: Secondary | ICD-10-CM | POA: Diagnosis not present

## 2023-11-11 DIAGNOSIS — B3781 Candidal esophagitis: Secondary | ICD-10-CM

## 2023-11-11 MED ORDER — FLUCONAZOLE 200 MG PO TABS: ORAL_TABLET | ORAL | 0 refills | Status: DC

## 2023-11-11 MED ORDER — NYSTATIN 100000 UNIT/ML MT SUSP
500000.0000 [IU] | Freq: Four times a day (QID) | OROMUCOSAL | 0 refills | Status: AC
Start: 2023-11-11 — End: 2023-11-18

## 2023-11-11 NOTE — Progress Notes (Signed)
 Patient ID: Ebony [name], female    DOB: 06-27-1950, 73 y.o.   MRN: [id number]  PCP: Leavy Mole, PA-C  Chief Complaint  Patient presents with   Weight Loss    Concerned about unintentional weight loss. Loss of appetite, doesn't like taste of food    Subjective:   Ebony [name] is a 73 y.o. female, presents to clinic with CC of the following:  HPI  Weight loss, decreased appetite and mouth pain/sores and GERD She has recurrent esophageal candida infection last EGD showed recurrent infection and erotion, she is here with similar sx with mouth sores/pain to gums She has been treating her mouth with hydrogen peroxide, mouth rinses, and cleaning her mouth with washcloth, trying not to wear dentures because they hurt She last got nystatin  60 mL supply from GI in March Other times she was treated with Diflucan  200 mg daily for 14 days After her initial EGD and diagnosis of candidal esophagitis she was treated with both nystatin  and oral Diflucan  Past flareups have been brought on by use of steroid inhalers or no sprays and she has not been using either of these for the past year She does have increased stress and anxiety which is worsening her reflux symptoms and she believes contributing towards loss of appetite, she has had health issues with her husband, herself, had to move suddenly, pay double rent for the past 5 months She noted weight loss more significantly this last month associate with decreased appetite mouth pain GERD, food tastes weird/different    Patient Active Problem List   Diagnosis Date Noted   Paroxysmal SVT (supraventricular tachycardia) (HCC) 02/19/2023   Single subsegmental pulmonary embolism without acute cor pulmonale (HCC) 01/01/2023   Seasonal allergic rhinitis 08/13/2022   Candida infection, esophageal (HCC) 08/13/2022   Arthritis of left knee 12/14/2020   Lumbar radiculopathy 12/14/2020   Lumbar spondylosis 12/14/2020   Dysphagia 06/12/2020    Gastroesophageal reflux disease 05/02/2020   B12 deficiency 12/30/2018   Hyperlipidemia 07/01/2018   Prediabetes 07/01/2018   Neutropenia, unspecified type (HCC) 12/18/2016   Arthritis of knee, degenerative 07/18/2015      Current Outpatient Medications:    acetaminophen  (TYLENOL ) 500 MG tablet, Take 1-2 tablets (500-1,000 mg total) by mouth every 8 (eight) hours as needed for moderate pain. OTC, Disp: 30 tablet, Rfl: 0   BIOTIN PO, Take 1 capsule by mouth daily at 6 (six) AM., Disp: , Rfl:    Calcium  Carbonate-Vit D-Min (CALCIUM  1200 PO), Take 600 mg by mouth daily. , Disp: , Rfl:    Cholecalciferol (VITAMIN D ) 125 MCG (5000 UT) CAPS, Take 1 capsule by mouth daily., Disp: , Rfl:    CINNAMON PO, Take by mouth., Disp: , Rfl:    Cyanocobalamin  (VITAMIN B-12) 1000 MCG SUBL, Place 1,000 mcg under the tongue daily. , Disp: , Rfl:    Evolocumab  (REPATHA  SURECLICK) 140 MG/ML SOAJ, Inject 140 mg into the skin every 14 (fourteen) days., Disp: 6 mL, Rfl: 3   ipratropium (ATROVENT ) 0.03 % nasal spray, Place 2 sprays into both nostrils every 12 (twelve) hours., Disp: 30 mL, Rfl: 12   levocetirizine (XYZAL ) 5 MG tablet, Take 1 tablet (5 mg total) by mouth every evening., Disp: 30 tablet, Rfl: 11   lidocaine  (LIDODERM ) 5 %, Place 1 patch onto the skin daily. As needed for msk pain, Disp: 30 patch, Rfl: 2   meloxicam (MOBIC) 7.5 MG tablet, Take 7.5 mg by mouth daily., Disp: , Rfl:  metoprolol  succinate (TOPROL  XL) 25 MG 24 hr tablet, Take 1 tablet (25 mg total) by mouth daily with breakfast., Disp: 90 tablet, Rfl: 3   Omega-3 Fatty Acids (FISH OIL PO), Take 1 capsule by mouth daily at 6 (six) AM., Disp: , Rfl:    pantoprazole  (PROTONIX ) 40 MG tablet, TAKE 1 TABLET (40 MG TOTAL) BY MOUTH DAILY AS NEEDED FOR GERD/ACID REFLUX, Disp: 90 tablet, Rfl: 3   REFRESH CELLUVISC 1 % GEL, , Disp: , Rfl:    RESTASIS 0.05 % ophthalmic emulsion, , Disp: , Rfl:    rosuvastatin  (CRESTOR ) 10 MG tablet, TAKE 1 TABLET  EVERY DAY (DOSE CHANGE), Disp: 90 tablet, Rfl: 3   tiZANidine  (ZANAFLEX ) 4 MG tablet, Take 1 tablet (4 mg total) by mouth every 8 (eight) hours as needed for muscle spasms (muscle tightness)., Disp: 30 tablet, Rfl: 0   traMADol  (ULTRAM ) 50 MG tablet, Take 1 tablet (50 mg total) by mouth every 6 (six) hours as needed., Disp: 20 tablet, Rfl: 0   TURMERIC PO, Take by mouth., Disp: , Rfl:    TYRVAYA 0.03 MG/ACT SOLN, , Disp: , Rfl:    Allergies  Allergen Reactions   Celecoxib Anxiety, Itching and Other (See Comments)   Flonase  [Fluticasone ] Other (See Comments)    Candidal infection when she uses steroids - severe to throat and esophagus - avoid intranasal steroid sprays      Social History   Tobacco Use   Smoking status: Never   Smokeless tobacco: Never  Vaping Use   Vaping status: Never Used  Substance Use Topics   Alcohol use: No   Drug use: No      Chart Review Today: I personally reviewed active problem list, medication list, allergies, family history, social history, health maintenance, notes from last encounter, lab results, imaging with the patient/caregiver today.   Review of Systems  Constitutional: Negative.   HENT: Negative.    Eyes: Negative.   Respiratory: Negative.    Cardiovascular: Negative.   Gastrointestinal: Negative.   Endocrine: Negative.   Genitourinary: Negative.   Musculoskeletal: Negative.   Skin: Negative.   Allergic/Immunologic: Negative.   Neurological: Negative.   Hematological: Negative.   Psychiatric/Behavioral: Negative.    All other systems reviewed and are negative.      Objective:   Vitals:   11/11/23 1119  BP: 122/76  Pulse: 69  Resp: 16  SpO2: 98%  Weight: 110 lb (49.9 kg)  Height: 5' 6 (1.676 m)    Body mass index is 17.75 kg/m.  Physical Exam Vitals and nursing note reviewed.  Constitutional:      General: She is not in acute distress.    Appearance: Normal appearance. She is well-developed and underweight. She  is not ill-appearing, toxic-appearing or diaphoretic.  HENT:     Head: Normocephalic and atraumatic.     Right Ear: External ear normal.     Left Ear: External ear normal.     Nose: Nose normal.     Mouth/Throat:     Pharynx: Posterior oropharyngeal erythema present.     Comments: Multiple areas of gums/cheeks erythematous and scattered white patches  Eyes:     General: No scleral icterus.       Right eye: No discharge.        Left eye: No discharge.     Conjunctiva/sclera: Conjunctivae normal.  Neck:     Trachea: No tracheal deviation.  Cardiovascular:     Rate and Rhythm: Normal rate and regular rhythm.  Pulses: Normal pulses.     Heart sounds: Normal heart sounds.  Pulmonary:     Effort: Pulmonary effort is normal. No respiratory distress.     Breath sounds: Normal breath sounds. No stridor. No wheezing, rhonchi or rales.  Abdominal:     General: Bowel sounds are normal. There is no distension.     Palpations: Abdomen is soft.     Tenderness: There is no abdominal tenderness.  Skin:    General: Skin is warm and dry.     Findings: No rash.  Neurological:     Mental Status: She is alert.     Motor: No abnormal muscle tone.     Coordination: Coordination normal.     Gait: Gait normal.  Psychiatric:        Attention and Perception: Attention normal.        Mood and Affect: Mood is anxious. Affect is tearful.        Speech: Speech normal.        Behavior: Behavior normal. Behavior is cooperative.        Thought Content: Thought content normal.      Results for orders placed or performed in visit on 07/11/23  CBC with Differential/Platelet   Collection Time: 07/11/23  2:50 PM  Result Value Ref Range   WBC 4.7 3.4 - 10.8 x10E3/uL   RBC 4.58 3.77 - 5.28 x10E6/uL   Hemoglobin 13.2 11.1 - 15.9 g/dL   Hematocrit 58.8 65.9 - 46.6 %   MCV 90 79 - 97 fL   MCH 28.8 26.6 - 33.0 pg   MCHC 32.1 31.5 - 35.7 g/dL   RDW 87.2 88.2 - 84.5 %   Platelets 204 150 - 450 x10E3/uL    Neutrophils 56 Not Estab. %   Lymphs 33 Not Estab. %   Monocytes 10 Not Estab. %   Eos 0 Not Estab. %   Basos 1 Not Estab. %   Neutrophils Absolute 2.6 1.4 - 7.0 x10E3/uL   Lymphocytes Absolute 1.5 0.7 - 3.1 x10E3/uL   Monocytes Absolute 0.5 0.1 - 0.9 x10E3/uL   EOS (ABSOLUTE) 0.0 0.0 - 0.4 x10E3/uL   Basophils Absolute 0.1 0.0 - 0.2 x10E3/uL   Immature Granulocytes 0 Not Estab. %   Immature Grans (Abs) 0.0 0.0 - 0.1 x10E3/uL  Comprehensive metabolic panel   Collection Time: 07/11/23  2:50 PM  Result Value Ref Range   Glucose 72 70 - 99 mg/dL   BUN 13 8 - 27 mg/dL   Creatinine, Ser 9.43 (L) 0.57 - 1.00 mg/dL   eGFR 96 >40 fO/fpw/8.26   BUN/Creatinine Ratio 23 12 - 28   Sodium 143 134 - 144 mmol/L   Potassium 4.3 3.5 - 5.2 mmol/L   Chloride 106 96 - 106 mmol/L   CO2 25 20 - 29 mmol/L   Calcium  9.5 8.7 - 10.3 mg/dL   Total Protein 6.6 6.0 - 8.5 g/dL   Albumin 4.1 3.8 - 4.8 g/dL   Globulin, Total 2.5 1.5 - 4.5 g/dL   Bilirubin Total 0.6 0.0 - 1.2 mg/dL   Alkaline Phosphatase 87 44 - 121 IU/L   AST 14 0 - 40 IU/L   ALT 8 0 - 32 IU/L  HIV antibody (with reflex)   Collection Time: 07/11/23  2:50 PM  Result Value Ref Range   HIV Screen 4th Generation wRfx Non Reactive Non Reactive  Hemoglobin A1c   Collection Time: 07/11/23  2:50 PM  Result Value Ref Range   Hgb  A1c MFr Bld 6.3 (H) 4.8 - 5.6 %   Est. average glucose Bld gHb Est-mCnc 134 mg/dL       Assessment & Plan:   1. Weight loss, unintentional (Primary) Decreased appetite with GERD/mouth pain/pain with swallowing and increased stress She has lost weight in the past with both stress and esophageal candidal infections which responds quickly to treatment but unfortunately has reoccurred or flared up multiple times In the past she was able to improve appetite and regained some weight once her reflux and candidal infections were treated In the past extensive workup with lab work was otherwise unrevealing so at this time we  will first treat her oral thrush and suspected gastritis esophagitis candidal infection with medications, patient was reassured, will do close follow-up to recheck her weight, if she is unable to improve symptoms or stabilize weight or regain weight we will do labs/further workup at her close follow-up visit Wt Readings from Last 5 Encounters:  11/11/23 110 lb (49.9 kg)  10/25/23 121 lb (54.9 kg)  07/11/23 129 lb (58.5 kg)  03/14/23 121 lb 9.6 oz (55.2 kg)  02/19/23 123 lb 12.8 oz (56.2 kg)   BMI Readings from Last 5 Encounters:  11/11/23 17.75 kg/m  10/25/23 19.53 kg/m  07/11/23 20.20 kg/m  03/14/23 19.05 kg/m  02/19/23 19.39 kg/m     2. Loss of appetite See #1  3. Stress and adjustment reaction Increase stress and anxiety secondary to health issues and financial stressors also had to move suddenly, states this has been going on for the past 5 months  4. Paroxysmal SVT (supraventricular tachycardia) (HCC) Symptoms have improved and she rarely has palpitations, currently on metoprolol   5. Candida infection, esophageal (HCC) Her last treatment she was given nystatin  only 60 mL which with current dosing would only allow for her to treat for about 3 days and unfortunately she has had recurrence She has been taking Protonix  daily Will treat again with longer course of nystatin  and oral Diflucan  and since candidal infection seems to return often and severely we will try to do some suppressive treatment with once a week or twice a week treatment for a few weeks or months Patient also encouraged to avoid all steroids She is going to restart a probiotic per recommendations from her pharmacist - nystatin  (MYCOSTATIN ) 100000 UNIT/ML suspension; Take 5 mLs (500,000 Units total) by mouth 4 (four) times daily for 7 days. swish in the mouth and retain for as long as possible (several minutes) before swallowing  Dispense: 280 mL; Refill: 0 - fluconazole  (DIFLUCAN ) 200 MG tablet; Take 1 tablet  (200 mg total) by mouth daily for 14 days, THEN 1 tablet (200 mg total) 2 (two) times a week for 28 days.  Dispense: 22 tablet; Refill: 0  6. Oral thrush Visible erythema and white patches or gums and cheeks and in the back of her oropharynx She is using hydrogen peroxide and multiple mouthwashes, she was encouraged to stop all of these and follow gentle normal gum and denture care, can use biotin, specifically instructed to stop using hydrogen peroxide Reviewed how to use nystatin  impress on the patient contact time on gums and throat is most important, given her a 7 to 14-day supply in addition to oral Diflucan   - nystatin  (MYCOSTATIN ) 100000 UNIT/ML suspension; Take 5 mLs (500,000 Units total) by mouth 4 (four) times daily for 7 days. swish in the mouth and retain for as long as possible (several minutes) before swallowing  Dispense: 280 mL;  Refill: 0 - fluconazole  (DIFLUCAN ) 200 MG tablet; Take 1 tablet (200 mg total) by mouth daily for 14 days, THEN 1 tablet (200 mg total) 2 (two) times a week for 28 days.  Dispense: 22 tablet; Refill: 0  Close f/up in 1 month sooner if sx aren't improving We can also get her back with GI at [facility] to be done when she f/up if not having improving sx/appetite/weight Pt reassured   Michelene Cower, PA-C 11/11/23 11:51 AM

## 2023-11-11 NOTE — Patient Instructions (Addendum)
 Stop using hydrogen peroxide in your mouth Use biotene, do normal gum care and do nystatin    Candidiasis, oropharyngeal, mild disease (alternative agent): Oral: Suspension (swish and swallow): 5 mLs units 4 times daily; swish in the mouth and retain for as long as possible (several minutes) before swallowing. Duration is for 7 to 14 days    And take oral diflucan  daily for 10 days and then once weekly for 8 weeks  Recheck in about 4 weeks

## 2023-12-15 ENCOUNTER — Other Ambulatory Visit: Payer: Self-pay | Admitting: Nurse Practitioner

## 2023-12-15 DIAGNOSIS — E782 Mixed hyperlipidemia: Secondary | ICD-10-CM

## 2023-12-17 ENCOUNTER — Ambulatory Visit: Admitting: Family Medicine

## 2023-12-17 ENCOUNTER — Encounter: Payer: Self-pay | Admitting: Family Medicine

## 2023-12-17 VITALS — BP 124/76 | HR 72 | Resp 16 | Ht 66.0 in | Wt 116.0 lb

## 2023-12-17 DIAGNOSIS — K219 Gastro-esophageal reflux disease without esophagitis: Secondary | ICD-10-CM | POA: Diagnosis not present

## 2023-12-17 DIAGNOSIS — Z5181 Encounter for therapeutic drug level monitoring: Secondary | ICD-10-CM

## 2023-12-17 DIAGNOSIS — R630 Anorexia: Secondary | ICD-10-CM

## 2023-12-17 DIAGNOSIS — D709 Neutropenia, unspecified: Secondary | ICD-10-CM | POA: Diagnosis not present

## 2023-12-17 DIAGNOSIS — R634 Abnormal weight loss: Secondary | ICD-10-CM | POA: Diagnosis not present

## 2023-12-17 DIAGNOSIS — R7303 Prediabetes: Secondary | ICD-10-CM

## 2023-12-17 DIAGNOSIS — E782 Mixed hyperlipidemia: Secondary | ICD-10-CM | POA: Diagnosis not present

## 2023-12-17 DIAGNOSIS — I471 Supraventricular tachycardia, unspecified: Secondary | ICD-10-CM

## 2023-12-17 DIAGNOSIS — B3781 Candidal esophagitis: Secondary | ICD-10-CM | POA: Diagnosis not present

## 2023-12-17 DIAGNOSIS — E538 Deficiency of other specified B group vitamins: Secondary | ICD-10-CM

## 2023-12-17 DIAGNOSIS — R1310 Dysphagia, unspecified: Secondary | ICD-10-CM | POA: Diagnosis not present

## 2023-12-17 DIAGNOSIS — B370 Candidal stomatitis: Secondary | ICD-10-CM

## 2023-12-17 MED ORDER — FLUCONAZOLE 150 MG PO TABS
150.0000 mg | ORAL_TABLET | ORAL | 0 refills | Status: AC
Start: 2023-12-17 — End: 2024-02-05

## 2023-12-17 NOTE — Patient Instructions (Signed)
 [facility] [address], [place], [place] [address] Scheduling phone #: [phone] Call to schedule your mammogram and bone density I ordered them earlier this year and the orders are good until next Feb

## 2023-12-17 NOTE — Progress Notes (Signed)
 Name: Ebony [name]   MRN: [id number]    DOB: [birth year]   Date:12/17/2023       Progress Note  Chief Complaint  Patient presents with   Follow-up    1 month  follow-up     Subjective:   Ebony [name] is a 73 y.o. female, presents to clinic for routine follow up on chronic conditions  F/up on thrush and recurrent yeast esophagitis causing weight loss, gerd/dysphagia Tx again with nystatin  and diflucan  Improved sx, no mouth pain, regained weight Wt Readings from Last 5 Encounters:  12/17/23 116 lb (52.6 kg)  11/11/23 110 lb (49.9 kg)  10/25/23 121 lb (54.9 kg)  07/11/23 129 lb (58.5 kg)  03/14/23 121 lb 9.6 oz (55.2 kg)   BMI Readings from Last 5 Encounters:  12/17/23 18.72 kg/m  11/11/23 17.75 kg/m  10/25/23 19.53 kg/m  07/11/23 20.20 kg/m  03/14/23 19.05 kg/m    Due for most of her routine f/up and labs SVT sx much improved with BB HLD due for labs started statin last year, no f/up labs, taking half dose due to SE Recheck cbc and b12 with prior abnormal labs and deficiencies    Current Outpatient Medications:    acetaminophen  (TYLENOL ) 500 MG tablet, Take 1-2 tablets (500-1,000 mg total) by mouth every 8 (eight) hours as needed for moderate pain. OTC, Disp: 30 tablet, Rfl: 0   BIOTIN PO, Take 1 capsule by mouth daily at 6 (six) AM., Disp: , Rfl:    Calcium  Carbonate-Vit D-Min (CALCIUM  1200 PO), Take 600 mg by mouth daily. , Disp: , Rfl:    Cholecalciferol (VITAMIN D ) 125 MCG (5000 UT) CAPS, Take 1 capsule by mouth daily., Disp: , Rfl:    CINNAMON PO, Take by mouth., Disp: , Rfl:    Cyanocobalamin  (VITAMIN B-12) 1000 MCG SUBL, Place 1,000 mcg under the tongue daily. , Disp: , Rfl:    Evolocumab  (REPATHA  SURECLICK) 140 MG/ML SOAJ, Inject 140 mg into the skin every 14 (fourteen) days., Disp: 6 mL, Rfl: 3   ipratropium (ATROVENT ) 0.03 % nasal spray, Place 2 sprays into both nostrils every 12 (twelve) hours., Disp: 30 mL, Rfl: 12   levocetirizine (XYZAL ) 5 MG  tablet, Take 1 tablet (5 mg total) by mouth every evening., Disp: 30 tablet, Rfl: 11   lidocaine  (LIDODERM ) 5 %, Place 1 patch onto the skin daily. As needed for msk pain, Disp: 30 patch, Rfl: 2   meloxicam (MOBIC) 7.5 MG tablet, Take 7.5 mg by mouth daily., Disp: , Rfl:    metoprolol  succinate (TOPROL  XL) 25 MG 24 hr tablet, Take 1 tablet (25 mg total) by mouth daily with breakfast., Disp: 90 tablet, Rfl: 3   Omega-3 Fatty Acids (FISH OIL PO), Take 1 capsule by mouth daily at 6 (six) AM., Disp: , Rfl:    pantoprazole  (PROTONIX ) 40 MG tablet, TAKE 1 TABLET (40 MG TOTAL) BY MOUTH DAILY AS NEEDED FOR GERD/ACID REFLUX, Disp: 90 tablet, Rfl: 3   REFRESH CELLUVISC 1 % GEL, , Disp: , Rfl:    RESTASIS 0.05 % ophthalmic emulsion, , Disp: , Rfl:    rosuvastatin  (CRESTOR ) 10 MG tablet, TAKE 1 TABLET EVERY DAY (DOSE CHANGE), Disp: 90 tablet, Rfl: 3   tiZANidine  (ZANAFLEX ) 4 MG tablet, Take 1 tablet (4 mg total) by mouth every 8 (eight) hours as needed for muscle spasms (muscle tightness)., Disp: 30 tablet, Rfl: 0   traMADol  (ULTRAM ) 50 MG tablet, Take 1 tablet (50 mg total) by  mouth every 6 (six) hours as needed., Disp: 20 tablet, Rfl: 0   TURMERIC PO, Take by mouth., Disp: , Rfl:    TYRVAYA 0.03 MG/ACT SOLN, , Disp: , Rfl:   Patient Active Problem List   Diagnosis Date Noted   Paroxysmal SVT (supraventricular tachycardia) (HCC) 02/19/2023   Single subsegmental pulmonary embolism without acute cor pulmonale (HCC) 01/01/2023   Seasonal allergic rhinitis 08/13/2022   Candida infection, esophageal (HCC) 08/13/2022   Arthritis of left knee 12/14/2020   Lumbar radiculopathy 12/14/2020   Lumbar spondylosis 12/14/2020   Dysphagia 06/12/2020   Gastroesophageal reflux disease 05/02/2020   B12 deficiency 12/30/2018   Hyperlipidemia 07/01/2018   Prediabetes 07/01/2018   Neutropenia, unspecified type (HCC) 12/18/2016   Arthritis of knee, degenerative 07/18/2015    Past Surgical History:  Procedure  Laterality Date   ABDOMINAL HYSTERECTOMY     complete   BIOPSY  10/22/2022   Procedure: BIOPSY;  Surgeon: Therisa Bi, MD;  Location: [facility] ENDOSCOPY;  Service: Gastroenterology;;   COLONOSCOPY WITH PROPOFOL  N/A 05/09/2017   Procedure: COLONOSCOPY WITH PROPOFOL ;  Surgeon: Jinny Carmine, MD;  Location: [facility];  Service: Endoscopy;  Laterality: N/A;   ESOPHAGEAL BRUSHING  10/22/2022   Procedure: ESOPHAGEAL BRUSHING;  Surgeon: Therisa Bi, MD;  Location: [facility] ENDOSCOPY;  Service: Gastroenterology;;   ESOPHAGOGASTRODUODENOSCOPY (EGD) WITH PROPOFOL  N/A 05/30/2020   Procedure: ESOPHAGOGASTRODUODENOSCOPY (EGD) WITH PROPOFOL ;  Surgeon: Therisa Bi, MD;  Location: [facility] ENDOSCOPY;  Service: Gastroenterology;  Laterality: N/A;   ESOPHAGOGASTRODUODENOSCOPY (EGD) WITH PROPOFOL  N/A 08/07/2020   Procedure: ESOPHAGOGASTRODUODENOSCOPY (EGD) WITH PROPOFOL ;  Surgeon: Therisa Bi, MD;  Location: [facility] ENDOSCOPY;  Service: Gastroenterology;  Laterality: N/A;   ESOPHAGOGASTRODUODENOSCOPY (EGD) WITH PROPOFOL  N/A 10/22/2022   Procedure: ESOPHAGOGASTRODUODENOSCOPY (EGD) WITH PROPOFOL ;  Surgeon: Therisa Bi, MD;  Location: [facility] ENDOSCOPY;  Service: Gastroenterology;  Laterality: N/A;   EYE SURGERY  08/04/2017   KNEE SURGERY Bilateral     Family History  Problem Relation Age of Onset   Hypertension Mother    Stroke Mother        4 mini strokes   Thyroid  disease Mother    Kidney failure Father    Diabetes Father    Hypertension Father    Kidney disease Father    Emphysema Maternal Grandmother    Diabetes Maternal Grandmother    Hypertension Maternal Grandmother    Diabetes Maternal Grandfather    Alcohol abuse Maternal Grandfather    Diabetes Paternal Grandmother    Heart attack Paternal Grandmother    Emphysema Paternal Grandfather    Diabetes Paternal Grandfather    Heart disease Paternal Grandfather    Thyroid  disease Sister    Vitamin D  deficiency Sister    Arthritis Sister    COPD Sister     Fibromyalgia Sister    Hypertension Sister    Hyperlipidemia Sister    Asthma Sister    Thyroid  disease Sister    Breast cancer Other    Cancer Neg Hx     Social History   Tobacco Use   Smoking status: Never   Smokeless tobacco: Never  Vaping Use   Vaping status: Never Used  Substance Use Topics   Alcohol use: No   Drug use: No     Allergies  Allergen Reactions   Celecoxib Anxiety, Itching and Other (See Comments)   Flonase  [Fluticasone ] Other (See Comments)    Candidal infection when she uses steroids - severe to throat and esophagus - avoid intranasal steroid sprays     Health  Maintenance  Topic Date Due   MAMMOGRAM  02/22/2021   DEXA SCAN  02/22/2022   COVID-19 Vaccine (5 - 2024-25 season) 01/05/2023   INFLUENZA VACCINE  12/05/2023   Zoster Vaccines- Shingrix (1 of 2) 02/11/2024 (Originally 09/19/1999)   Pneumococcal Vaccine: 50+ Years (1 of 1 - PCV) 02/19/2024 (Originally 09/19/1999)   DTaP/Tdap/Td (1 - Tdap) 11/10/2024 (Originally 09/18/1969)   Medicare Annual Wellness (AWV)  07/02/2024   Colonoscopy  05/10/2027   Hepatitis C Screening  Completed   Hepatitis B Vaccines  Aged Out   HPV VACCINES  Aged Out   Meningococcal B Vaccine  Aged Out    Chart Review Today: I personally reviewed active problem list, medication list, allergies, family history, social history, health maintenance, notes from last encounter, lab results, imaging with the patient/caregiver today.   Review of Systems  Constitutional: Negative.   HENT: Negative.    Eyes: Negative.   Respiratory: Negative.    Cardiovascular: Negative.   Gastrointestinal: Negative.   Endocrine: Negative.   Genitourinary: Negative.   Musculoskeletal: Negative.   Skin: Negative.   Allergic/Immunologic: Negative.   Neurological: Negative.   Hematological: Negative.   Psychiatric/Behavioral: Negative.    All other systems reviewed and are negative.    Objective:   Vitals:   12/17/23 1105  BP: 124/76   Pulse: 72  Resp: 16  SpO2: 98%  Weight: 116 lb (52.6 kg)  Height: 5' 6 (1.676 m)    Body mass index is 18.72 kg/m.  Physical Exam Vitals and nursing note reviewed.  Constitutional:      General: She is not in acute distress.    Appearance: Normal appearance. She is well-developed and well-groomed. She is not ill-appearing, toxic-appearing or diaphoretic.  HENT:     Head: Normocephalic and atraumatic.     Right Ear: External ear normal.     Left Ear: External ear normal.     Nose: Nose normal.     Mouth/Throat:     Mouth: Mucous membranes are moist.     Pharynx: Oropharynx is clear. No oropharyngeal exudate or posterior oropharyngeal erythema.     Comments: MM moist, no erythema or white patches Eyes:     General: No scleral icterus.       Right eye: No discharge.        Left eye: No discharge.     Conjunctiva/sclera: Conjunctivae normal.  Neck:     Trachea: No tracheal deviation.  Cardiovascular:     Rate and Rhythm: Normal rate and regular rhythm.     Pulses: Normal pulses.     Heart sounds: Normal heart sounds.  Pulmonary:     Effort: Pulmonary effort is normal. No respiratory distress.     Breath sounds: Normal breath sounds. No stridor.  Skin:    General: Skin is warm and dry.     Findings: No rash.  Neurological:     Mental Status: She is alert.     Motor: No abnormal muscle tone.     Coordination: Coordination normal.     Gait: Gait normal.  Psychiatric:        Mood and Affect: Mood normal.        Behavior: Behavior normal. Behavior is cooperative.      Functional Status Survey:   Results for orders placed or performed in visit on 07/11/23  CBC with Differential/Platelet   Collection Time: 07/11/23  2:50 PM  Result Value Ref Range   WBC 4.7 3.4 - 10.8 x10E3/uL  RBC 4.58 3.77 - 5.28 x10E6/uL   Hemoglobin 13.2 11.1 - 15.9 g/dL   Hematocrit 58.8 65.9 - 46.6 %   MCV 90 79 - 97 fL   MCH 28.8 26.6 - 33.0 pg   MCHC 32.1 31.5 - 35.7 g/dL   RDW 87.2  88.2 - 84.5 %   Platelets 204 150 - 450 x10E3/uL   Neutrophils 56 Not Estab. %   Lymphs 33 Not Estab. %   Monocytes 10 Not Estab. %   Eos 0 Not Estab. %   Basos 1 Not Estab. %   Neutrophils Absolute 2.6 1.4 - 7.0 x10E3/uL   Lymphocytes Absolute 1.5 0.7 - 3.1 x10E3/uL   Monocytes Absolute 0.5 0.1 - 0.9 x10E3/uL   EOS (ABSOLUTE) 0.0 0.0 - 0.4 x10E3/uL   Basophils Absolute 0.1 0.0 - 0.2 x10E3/uL   Immature Granulocytes 0 Not Estab. %   Immature Grans (Abs) 0.0 0.0 - 0.1 x10E3/uL  Comprehensive metabolic panel   Collection Time: 07/11/23  2:50 PM  Result Value Ref Range   Glucose 72 70 - 99 mg/dL   BUN 13 8 - 27 mg/dL   Creatinine, Ser 9.43 (L) 0.57 - 1.00 mg/dL   eGFR 96 >40 fO/fpw/8.26   BUN/Creatinine Ratio 23 12 - 28   Sodium 143 134 - 144 mmol/L   Potassium 4.3 3.5 - 5.2 mmol/L   Chloride 106 96 - 106 mmol/L   CO2 25 20 - 29 mmol/L   Calcium  9.5 8.7 - 10.3 mg/dL   Total Protein 6.6 6.0 - 8.5 g/dL   Albumin 4.1 3.8 - 4.8 g/dL   Globulin, Total 2.5 1.5 - 4.5 g/dL   Bilirubin Total 0.6 0.0 - 1.2 mg/dL   Alkaline Phosphatase 87 44 - 121 IU/L   AST 14 0 - 40 IU/L   ALT 8 0 - 32 IU/L  HIV antibody (with reflex)   Collection Time: 07/11/23  2:50 PM  Result Value Ref Range   HIV Screen 4th Generation wRfx Non Reactive Non Reactive  Hemoglobin A1c   Collection Time: 07/11/23  2:50 PM  Result Value Ref Range   Hgb A1c MFr Bld 6.3 (H) 4.8 - 5.6 %   Est. average glucose Bld gHb Est-mCnc 134 mg/dL      Assessment & Plan:   Weight loss, unintentional -     Ambulatory referral to Gastroenterology -     TSH  Loss of appetite -     Ambulatory referral to Gastroenterology  Gastroesophageal reflux disease, unspecified whether esophagitis present -     Ambulatory referral to Gastroenterology  Candida infection, esophageal (HCC) -     Ambulatory referral to Gastroenterology -     Fluconazole ; Take 1 tablet (150 mg total) by mouth once a week for 8 doses. For recurrent oral  and esophageal yeast infections  Dispense: 8 tablet; Refill: 0  Dysphagia, unspecified type -     Ambulatory referral to Gastroenterology  Thrush -     Fluconazole ; Take 1 tablet (150 mg total) by mouth once a week for 8 doses. For recurrent oral and esophageal yeast infections  Dispense: 8 tablet; Refill: 0  Prediabetes -     Hemoglobin A1c -     Comprehensive metabolic panel with GFR  Neutropenia, unspecified type (HCC) -     CBC with Differential/Platelet  B12 deficiency Assessment & Plan: Lab Results  Component Value Date   VITAMINB12 1,605 (H) 09/27/2019   On sublingual supplement Not checked in years  Orders: -     CBC with Differential/Platelet -     Vitamin B12  Mixed hyperlipidemia -     Lipid panel -     Comprehensive metabolic panel with GFR  Paroxysmal SVT (supraventricular tachycardia) (HCC) -     TSH  Encounter for medication monitoring -     CBC with Differential/Platelet -     Hemoglobin A1c -     Lipid panel -     TSH -     Comprehensive metabolic panel with GFR -     Vitamin B12   Assessment and Plan Assessment & Plan Recurrent oral and esophageal candidiasis Recurrent candidiasis with improved symptoms. Discussed antifungal treatments and maintaining oral flora balance. Emphasized early treatment to prevent severe flare-ups. - Prescribe Diflucan  (fluconazole ) once weekly as needed. Consider suppressive dose? Vs PRN dosing or early tx before severe flares  - Refill nystatin  oral suspension for prn use for oral sores or white patches, sore throat pain with swallowing - Ensure proper denture cleaning  - Avoid alcohol-based mouthwashes. - Continue Biotene for dry mouth. - Consider infectious disease referral if severe recurrences.  Supraventricular tachycardia Supraventricular tachycardia managed with metoprolol  succinate, reducing episode frequency. Discussed potential dose adjustment if episodes increase. - Continue metoprolol  succinate 25 mg  daily. - Monitor for increased palpitations and consider dose adjustment.  Gastroesophageal reflux disease GERD with occasional symptoms managed by avoiding dietary triggers. Discussed importance of identifying personal triggers. - Continue avoiding known dietary triggers, continue avoiding steroids which exacerbate yeast infection - continue to monitor and manage yeast sx -continue PPI PRN - Ensure connection with GI specialists for future needs.  Mixed hyperlipidemia Mixed hyperlipidemia managed with rosuvastatin . Dose halved due to side effects. Cholesterol labs overdue. - Order blood draw for cholesterol levels. - Continue rosuvastatin  at adjusted dose.  Prediabetes Prediabetes with ongoing monitoring. Taking cinnamon supplements. Family history of diabetes noted. - Check A1c today Lab Results  Component Value Date   HGBA1C 6.3 (H) 07/11/2023    History of vitamin B12 deficiency History of vitamin B12 deficiency managed with sublingual supplements. No recent labs for B12 levels. - Order blood draw to check B12 levels.  Stress and adjustment reaction Stress and adjustment reaction related to health concerns. Discussed stress impact on health and importance of management.  Recording duration: 21 minutes     Return in about 6 months (around 06/18/2024) for Routine follow-up Prediabetes, gerd.   Michelene Cower, PA-C 12/17/23 11:27 AM

## 2023-12-17 NOTE — Assessment & Plan Note (Signed)
 Lab Results  Component Value Date   VITAMINB12 1,605 (H) 09/27/2019   On sublingual supplement Not checked in years

## 2023-12-18 LAB — CBC WITH DIFFERENTIAL/PLATELET
Absolute Lymphocytes: 1440 {cells}/uL (ref 850–3900)
Absolute Monocytes: 440 {cells}/uL (ref 200–950)
Basophils Absolute: 40 {cells}/uL (ref 0–200)
Basophils Relative: 1 %
Eosinophils Absolute: 0 {cells}/uL — ABNORMAL LOW (ref 15–500)
Eosinophils Relative: 0 %
HCT: 42.8 % (ref 35.0–45.0)
Hemoglobin: 13.5 g/dL (ref 11.7–15.5)
MCH: 29.2 pg (ref 27.0–33.0)
MCHC: 31.5 g/dL — ABNORMAL LOW (ref 32.0–36.0)
MCV: 92.6 fL (ref 80.0–100.0)
MPV: 10.2 fL (ref 7.5–12.5)
Monocytes Relative: 11 %
Neutro Abs: 2080 {cells}/uL (ref 1500–7800)
Neutrophils Relative %: 52 %
Platelets: 222 Thousand/uL (ref 140–400)
RBC: 4.62 Million/uL (ref 3.80–5.10)
RDW: 12.9 % (ref 11.0–15.0)
Total Lymphocyte: 36 %
WBC: 4 Thousand/uL (ref 3.8–10.8)

## 2023-12-18 LAB — COMPREHENSIVE METABOLIC PANEL WITH GFR
AG Ratio: 1.4 (calc) (ref 1.0–2.5)
ALT: 14 U/L (ref 6–29)
AST: 16 U/L (ref 10–35)
Albumin: 3.8 g/dL (ref 3.6–5.1)
Alkaline phosphatase (APISO): 91 U/L (ref 37–153)
BUN/Creatinine Ratio: 33 (calc) — ABNORMAL HIGH (ref 6–22)
BUN: 18 mg/dL (ref 7–25)
CO2: 30 mmol/L (ref 20–32)
Calcium: 9.3 mg/dL (ref 8.6–10.4)
Chloride: 106 mmol/L (ref 98–110)
Creat: 0.55 mg/dL — ABNORMAL LOW (ref 0.60–1.00)
Globulin: 2.7 g/dL (ref 1.9–3.7)
Glucose, Bld: 77 mg/dL (ref 65–99)
Potassium: 4.9 mmol/L (ref 3.5–5.3)
Sodium: 142 mmol/L (ref 135–146)
Total Bilirubin: 0.4 mg/dL (ref 0.2–1.2)
Total Protein: 6.5 g/dL (ref 6.1–8.1)
eGFR: 96 mL/min/1.73m2 (ref >=60)

## 2023-12-18 LAB — LIPID PANEL
Cholesterol: 314 mg/dL — ABNORMAL HIGH (ref <200)
HDL: 91 mg/dL (ref >=50)
LDL Cholesterol (Calc): 202 mg/dL — ABNORMAL HIGH
Non-HDL Cholesterol (Calc): 223 mg/dL — ABNORMAL HIGH (ref <130)
Total CHOL/HDL Ratio: 3.5 (calc) (ref <5.0)
Triglycerides: 89 mg/dL (ref <150)

## 2023-12-18 LAB — TSH: TSH: 1.64 m[IU]/L (ref 0.40–4.50)

## 2023-12-18 LAB — HEMOGLOBIN A1C
Hgb A1c MFr Bld: 6.7 % — ABNORMAL HIGH (ref <5.7)
Mean Plasma Glucose: 146 mg/dL
eAG (mmol/L): 8.1 mmol/L

## 2023-12-18 LAB — VITAMIN B12: Vitamin B-12: >2000 pg/mL — ABNORMAL HIGH (ref 200–1100)

## 2023-12-18 NOTE — Telephone Encounter (Signed)
 Requested Prescriptions  Pending Prescriptions Disp Refills   rosuvastatin  (CRESTOR ) 10 MG tablet [Pharmacy Med Name: ROSUVASTATIN  CALCIUM  10 MG Oral Tablet] 90 tablet 3    Sig: TAKE 1 TABLET EVERY DAY (DOSE CHANGE)     Cardiovascular:  Antilipid - Statins 2 Failed - 12/18/2023 12:52 PM      Failed - Cr in normal range and within 360 days    Creat  Date Value Ref Range Status  12/17/2023 0.55 (L) 0.60 - 1.00 mg/dL Final         Failed - Lipid Panel in normal range within the last 12 months    Cholesterol, Total  Date Value Ref Range Status  07/25/2015 227 (H) 100 - 199 mg/dL Final   Cholesterol  Date Value Ref Range Status  12/17/2023 314 (H) <200 mg/dL Final   LDL Cholesterol (Calc)  Date Value Ref Range Status  12/17/2023 202 (H) mg/dL (calc) Final    Comment:    LDL-C levels > or = 190 mg/dL may indicate familial  hypercholesterolemia (FH). Clinical assessment and  measurement of blood lipid levels should be  considered for all first degree relatives of patients with an FH diagnosis. LDL Cholesterol (LDL-C) levels > or = 300 mg/dL may indicate homozygous familial hypercholesterolemia (HoFH). Untreated,  these extremely high LDL-C levels can result in premature CV events and mortality. Patients should be identified early and provided appropriate interventions to reduce the cumulative LDL-C burden from birth. . For questions about testing for familial hypercholesterolemia, please call Engineer, materials at 1.866.GENE.INFO. SABRA Veatrice DASEN, et al. J National Lipid Association  Recommendations for Patient-Centered Management of Dyslipidemia: Part 1 Journal of  Clinical Lipidology 2015;9(2), 129-169. Cuchel, M. et al. (2014). Homozygous familial hypercholesterolaemia: new insights and guidance for clinicians to improve detection and clinical management. European Heart Journal, 35(32), [phone]. Reference range: <100 . Desirable range <100 mg/dL for primary  prevention;   <70 mg/dL for patients with CHD or diabetic patients  with > or = 2 CHD risk factors. SABRA LDL-C is now calculated using the Martin-Hopkins  calculation, which is a validated novel method providing  better accuracy than the Friedewald equation in the  estimation of LDL-C.  Gladis APPLETHWAITE et al. SANDREA. 7986;689(80): 2061-2068  (http://education.[employer].com/faq/FAQ164)    HDL  Date Value Ref Range Status  12/17/2023 91 > OR = 50 mg/dL Final  96/78/7982 88 >60 mg/dL Final   Triglycerides  Date Value Ref Range Status  12/17/2023 89 <150 mg/dL Final         Passed - Patient is not pregnant      Passed - Valid encounter within last 12 months    Recent Outpatient Visits           Yesterday Weight loss, unintentional   [facility] Leavy Mole, PA-C   1 month ago Weight loss, unintentional   [facility] Leavy Mole, [place]       Future Appointments             In 6 months Leavy Mole, PA-C [facility], [facility]

## 2023-12-25 ENCOUNTER — Other Ambulatory Visit: Payer: Self-pay | Admitting: Cardiovascular Disease

## 2023-12-25 DIAGNOSIS — R002 Palpitations: Secondary | ICD-10-CM

## 2023-12-25 DIAGNOSIS — I471 Supraventricular tachycardia, unspecified: Secondary | ICD-10-CM

## 2023-12-30 DIAGNOSIS — M170 Bilateral primary osteoarthritis of knee: Secondary | ICD-10-CM | POA: Diagnosis not present

## 2023-12-31 ENCOUNTER — Ambulatory Visit (INDEPENDENT_AMBULATORY_CARE_PROVIDER_SITE_OTHER): Admitting: Family Medicine

## 2023-12-31 ENCOUNTER — Ambulatory Visit: Payer: Self-pay | Admitting: Family Medicine

## 2023-12-31 ENCOUNTER — Encounter: Payer: Self-pay | Admitting: Family Medicine

## 2023-12-31 VITALS — BP 136/84 | HR 73 | Resp 16 | Ht 66.0 in | Wt 116.0 lb

## 2023-12-31 DIAGNOSIS — R634 Abnormal weight loss: Secondary | ICD-10-CM

## 2023-12-31 DIAGNOSIS — M5416 Radiculopathy, lumbar region: Secondary | ICD-10-CM | POA: Diagnosis not present

## 2023-12-31 DIAGNOSIS — R600 Localized edema: Secondary | ICD-10-CM | POA: Diagnosis not present

## 2023-12-31 DIAGNOSIS — E782 Mixed hyperlipidemia: Secondary | ICD-10-CM | POA: Diagnosis not present

## 2023-12-31 DIAGNOSIS — K219 Gastro-esophageal reflux disease without esophagitis: Secondary | ICD-10-CM

## 2023-12-31 DIAGNOSIS — I471 Supraventricular tachycardia, unspecified: Secondary | ICD-10-CM

## 2023-12-31 DIAGNOSIS — E119 Type 2 diabetes mellitus without complications: Secondary | ICD-10-CM | POA: Diagnosis not present

## 2023-12-31 DIAGNOSIS — B3781 Candidal esophagitis: Secondary | ICD-10-CM | POA: Diagnosis not present

## 2023-12-31 DIAGNOSIS — B370 Candidal stomatitis: Secondary | ICD-10-CM | POA: Diagnosis not present

## 2023-12-31 NOTE — Assessment & Plan Note (Signed)
 New onset, reviewed her labs with her Dm foot exam done today and UACR, will need DM eye exam Discussed diet efforts starting with cutting out sugary sodas and drinks - referral to diabetes educator/nutritionalist Lab Results  Component Value Date   HGBA1C 6.7 (H) 12/17/2023

## 2023-12-31 NOTE — Assessment & Plan Note (Signed)
 Recurrent causing decreased appetite, weight loss, pain and difficulty swallowing and worse GERD sx, has responded to longer course of diflucan  and oral nystatin , currently she completed 2 weeks of diflucan  and is doing weekly dose to see if episodes can be better suppressed She has GI f/up

## 2023-12-31 NOTE — Progress Notes (Signed)
 Patient ID: Ebony [name], female    DOB: May 18, 1950, 73 y.o.   MRN: [id number]  PCP: Leavy Mole, PA-C  Chief Complaint  Patient presents with   Edema    B/L legs. Recently saw ortho for knee injections- told it was unrelated    Subjective:   Ebony [name] is a 73 y.o. female, presents to clinic with CC of the following:  HPI  Here for LE edema onset the last couple days She is barely ambulatory due to severe joint pain, she got shots in both knees yesterday and felt lightheaded afterwards and came by our office after to get checked for swelling and dizziness Dizziness has resolved and the LE edema is better today than yesterday R wearing knee brace on right knee No CP SOB palpitations, urinary changes or swelling anywhere else BP Readings from Last 3 Encounters:  12/31/23 136/84  12/17/23 124/76  11/11/23 122/76   Pulse Readings from Last 3 Encounters:  12/31/23 73  12/17/23 72  11/11/23 69   Labs from recent OV reviewed with pt New onset DM  Drinking sprite, tea, eating rice and a lot of high calorie foods in effort to regain weight.  Patient Active Problem List   Diagnosis Date Noted   Paroxysmal SVT (supraventricular tachycardia) (HCC) 02/19/2023   Single subsegmental pulmonary embolism without acute cor pulmonale (HCC) 01/01/2023   Seasonal allergic rhinitis 08/13/2022   Candida infection, esophageal (HCC) 08/13/2022   Arthritis of left knee 12/14/2020   Lumbar radiculopathy 12/14/2020   Lumbar spondylosis 12/14/2020   Dysphagia 06/12/2020   Gastroesophageal reflux disease 05/02/2020   B12 deficiency 12/30/2018   Hyperlipidemia 07/01/2018   Prediabetes 07/01/2018   Neutropenia, unspecified type (HCC) 12/18/2016   Arthritis of knee, degenerative 07/18/2015      Current Outpatient Medications:    acetaminophen  (TYLENOL ) 500 MG tablet, Take 1-2 tablets (500-1,000 mg total) by mouth every 8 (eight) hours as needed for moderate pain. OTC, Disp: 30 tablet,  Rfl: 0   BIOTIN PO, Take 1 capsule by mouth daily at 6 (six) AM., Disp: , Rfl:    Calcium  Carbonate-Vit D-Min (CALCIUM  1200 PO), Take 600 mg by mouth daily. , Disp: , Rfl:    Cholecalciferol (VITAMIN D ) 125 MCG (5000 UT) CAPS, Take 1 capsule by mouth daily., Disp: , Rfl:    CINNAMON PO, Take by mouth., Disp: , Rfl:    Cyanocobalamin  (VITAMIN B-12) 1000 MCG SUBL, Place 1,000 mcg under the tongue daily. , Disp: , Rfl:    Evolocumab  (REPATHA  SURECLICK) 140 MG/ML SOAJ, Inject 140 mg into the skin every 14 (fourteen) days., Disp: 6 mL, Rfl: 3   fluconazole  (DIFLUCAN ) 150 MG tablet, Take 1 tablet (150 mg total) by mouth once a week for 8 doses. For recurrent oral and esophageal yeast infections, Disp: 8 tablet, Rfl: 0   ipratropium (ATROVENT ) 0.03 % nasal spray, Place 2 sprays into both nostrils every 12 (twelve) hours., Disp: 30 mL, Rfl: 12   levocetirizine (XYZAL ) 5 MG tablet, Take 1 tablet (5 mg total) by mouth every evening., Disp: 30 tablet, Rfl: 11   lidocaine  (LIDODERM ) 5 %, Place 1 patch onto the skin daily. As needed for msk pain, Disp: 30 patch, Rfl: 2   meloxicam (MOBIC) 7.5 MG tablet, Take 7.5 mg by mouth daily., Disp: , Rfl:    metoprolol  succinate (TOPROL -XL) 25 MG 24 hr tablet, TAKE 1 TABLET EVERY DAY WITH BREAKFAST, Disp: 90 tablet, Rfl: 0   Omega-3 Fatty Acids (  FISH OIL PO), Take 1 capsule by mouth daily at 6 (six) AM., Disp: , Rfl:    pantoprazole  (PROTONIX ) 40 MG tablet, TAKE 1 TABLET (40 MG TOTAL) BY MOUTH DAILY AS NEEDED FOR GERD/ACID REFLUX, Disp: 90 tablet, Rfl: 3   REFRESH CELLUVISC 1 % GEL, , Disp: , Rfl:    RESTASIS 0.05 % ophthalmic emulsion, , Disp: , Rfl:    rosuvastatin  (CRESTOR ) 10 MG tablet, TAKE 1 TABLET EVERY DAY (DOSE CHANGE), Disp: 90 tablet, Rfl: 3   tiZANidine  (ZANAFLEX ) 4 MG tablet, Take 1 tablet (4 mg total) by mouth every 8 (eight) hours as needed for muscle spasms (muscle tightness)., Disp: 30 tablet, Rfl: 0   traMADol  (ULTRAM ) 50 MG tablet, Take 1 tablet (50 mg  total) by mouth every 6 (six) hours as needed., Disp: 20 tablet, Rfl: 0   TURMERIC PO, Take by mouth., Disp: , Rfl:    TYRVAYA 0.03 MG/ACT SOLN, , Disp: , Rfl:    Allergies  Allergen Reactions   Celecoxib Anxiety, Itching and Other (See Comments)   Flonase  [Fluticasone ] Other (See Comments)    Candidal infection when she uses steroids - severe to throat and esophagus - avoid intranasal steroid sprays      Social History   Tobacco Use   Smoking status: Never   Smokeless tobacco: Never  Vaping Use   Vaping status: Never Used  Substance Use Topics   Alcohol use: No   Drug use: No      Chart Review Today: I personally reviewed active problem list, medication list, allergies, family history, social history, health maintenance, notes from last encounter, lab results, imaging with the patient/caregiver today.   Review of Systems  Constitutional: Negative.   HENT: Negative.    Eyes: Negative.   Respiratory: Negative.    Cardiovascular: Negative.   Gastrointestinal: Negative.   Endocrine: Negative.   Genitourinary: Negative.   Musculoskeletal: Negative.   Skin: Negative.   Allergic/Immunologic: Negative.   Neurological: Negative.   Hematological: Negative.   Psychiatric/Behavioral: Negative.    All other systems reviewed and are negative.      Objective:   Vitals:   12/31/23 1116  BP: 136/84  Pulse: 73  Resp: 16  SpO2: 98%  Weight: 116 lb (52.6 kg)  Height: 5' 6 (1.676 m)    Body mass index is 18.72 kg/m.  Physical Exam Vitals and nursing note reviewed.  Constitutional:      General: She is not in acute distress.    Appearance: Normal appearance. She is well-developed. She is not ill-appearing, toxic-appearing or diaphoretic.  HENT:     Head: Normocephalic and atraumatic.     Right Ear: External ear normal.     Left Ear: External ear normal.     Nose: Nose normal.  Eyes:     General: No scleral icterus.       Right eye: No discharge.        Left  eye: No discharge.     Conjunctiva/sclera: Conjunctivae normal.  Neck:     Trachea: No tracheal deviation.  Cardiovascular:     Rate and Rhythm: Normal rate. Extrasystoles are present.    Chest Wall: PMI is not displaced.     Pulses:          Radial pulses are 2+ on the right side and 2+ on the left side.     Heart sounds: Heart sounds not distant. No murmur heard.    No friction rub. No gallop.  Pulmonary:  Effort: Pulmonary effort is normal. No respiratory distress.     Breath sounds: No stridor.  Musculoskeletal:     Right lower leg: 2+ Edema present.     Left lower leg: 1+ Edema present.  Skin:    General: Skin is warm and dry.     Findings: No rash.  Neurological:     Mental Status: She is alert.     Motor: No abnormal muscle tone.     Coordination: Coordination normal.     Gait: Gait abnormal (antalgic gait).  Psychiatric:        Mood and Affect: Mood normal.        Behavior: Behavior normal.      Results for orders placed or performed in visit on 12/17/23  CBC with Differential/Platelet   Collection Time: 12/17/23 11:51 AM  Result Value Ref Range   WBC 4.0 3.8 - 10.8 Thousand/uL   RBC 4.62 3.80 - 5.10 Million/uL   Hemoglobin 13.5 11.7 - 15.5 g/dL   HCT 57.1 64.9 - 54.9 %   MCV 92.6 80.0 - 100.0 fL   MCH 29.2 27.0 - 33.0 pg   MCHC 31.5 (L) 32.0 - 36.0 g/dL   RDW 87.0 88.9 - 84.9 %   Platelets 222 140 - 400 Thousand/uL   MPV 10.2 7.5 - 12.5 fL   Neutro Abs 2,080 1,500 - 7,800 cells/uL   Absolute Lymphocytes 1,440 850 - 3,900 cells/uL   Absolute Monocytes 440 200 - 950 cells/uL   Eosinophils Absolute 0 (L) 15 - 500 cells/uL   Basophils Absolute 40 0 - 200 cells/uL   Neutrophils Relative % 52 %   Total Lymphocyte 36.0 %   Monocytes Relative 11.0 %   Eosinophils Relative 0.0 %   Basophils Relative 1.0 %  Hemoglobin A1c   Collection Time: 12/17/23 11:51 AM  Result Value Ref Range   Hgb A1c MFr Bld 6.7 (H) <5.7 %   Mean Plasma Glucose 146 mg/dL   eAG  (mmol/L) 8.1 mmol/L  Lipid panel   Collection Time: 12/17/23 11:51 AM  Result Value Ref Range   Cholesterol 314 (H) <200 mg/dL   HDL 91 > OR = 50 mg/dL   Triglycerides 89 <849 mg/dL   LDL Cholesterol (Calc) 202 (H) mg/dL (calc)   Total CHOL/HDL Ratio 3.5 <5.0 (calc)   Non-HDL Cholesterol (Calc) 223 (H) <130 mg/dL (calc)  TSH   Collection Time: 12/17/23 11:51 AM  Result Value Ref Range   TSH 1.64 0.40 - 4.50 mIU/L  Comprehensive metabolic panel with GFR   Collection Time: 12/17/23 11:51 AM  Result Value Ref Range   Glucose, Bld 77 65 - 99 mg/dL   BUN 18 7 - 25 mg/dL   Creat 9.44 (L) 9.39 - 1.00 mg/dL   eGFR 96 > OR = 60 fO/fpw/8.26f7   BUN/Creatinine Ratio 33 (H) 6 - 22 (calc)   Sodium 142 135 - 146 mmol/L   Potassium 4.9 3.5 - 5.3 mmol/L   Chloride 106 98 - 110 mmol/L   CO2 30 20 - 32 mmol/L   Calcium  9.3 8.6 - 10.4 mg/dL   Total Protein 6.5 6.1 - 8.1 g/dL   Albumin 3.8 3.6 - 5.1 g/dL   Globulin 2.7 1.9 - 3.7 g/dL (calc)   AG Ratio 1.4 1.0 - 2.5 (calc)   Total Bilirubin 0.4 0.2 - 1.2 mg/dL   Alkaline phosphatase (APISO) 91 37 - 153 U/L   AST 16 10 - 35 U/L   ALT 14 6 -  29 U/L  Vitamin B12   Collection Time: 12/17/23 11:51 AM  Result Value Ref Range   Vitamin B-12 >2,000 (H) 200 - 1,100 pg/mL       Assessment & Plan:   Pt is 73 y/o female here with acute sx of LE Edema onset last 1-2 d    Bilateral lower extremity edema    -  Primary  Patient had increased pitting edema to bilateral lower extremities over the last day or 2 she also has recently increasing pain to low back of bilateral legs and knees with significant decreased to her activity and ambulation daily for the past several weeks.  She got injections in both her knees yesterday with her orthopedic specialist and she is wearing a knee brace on her right knee she reports that the swelling improved since its onset yesterday her right lower extremity swelling is slightly worse than her left She denies any  associated orthopnea, weight gain, palpitations, PND DOE -patient appears euvolemic, doubt lower extremity edema because to be anything cardiac More likely suspect that decreased ambulation and mobility has increased dependent edema further evidenced by were swelling on her right leg than her left, both legs improving but wearing a right knee brace which is likely causing R>L edema Advised continued monitoring - watchful waiting since it is already improving, continue ambulation as tolerated, elevated legs, wear compression socks or stockings which she does have, low-salt diet  Recommend follow-up appointment and completing labs if the dependent edema does not gradually improve over the next 1 to 2 weeks     Relevant Orders   Comprehensive metabolic panel with GFR   TSH   B Nat Peptide   CBC with Differential/Platelet       Reviewed her recent labs from OV, new DM, abnormal labs: Problem List Items Addressed This Visit       Cardiovascular and Mediastinum   Paroxysmal SVT (supraventricular tachycardia) (HCC)   Currently controlled with BB, HR WNL, no palpitations Pulse Readings from Last 3 Encounters:  12/31/23 73  12/17/23 72  11/11/23 69          Digestive   Gastroesophageal reflux disease   Much better control of sx currently, continue PPI and GI f/up appt scheduled      Candida infection, esophageal (HCC)   Recurrent causing decreased appetite, weight loss, pain and difficulty swallowing and worse GERD sx, has responded to longer course of diflucan  and oral nystatin , currently she completed 2 weeks of diflucan  and is doing weekly dose to see if episodes can be better suppressed She has GI f/up      Thrush   Recurrent thrush, responded well to last treatment Will need to observe over time if flare ups are related to higher sugars - if so consider tx of new onset DM to also help manage thrush/esophageal candidiasis          Endocrine   Type 2 diabetes mellitus  without complication, without long-term current use of insulin (HCC)   New onset, reviewed her labs with her Dm foot exam done today and UACR, will need DM eye exam Discussed diet efforts starting with cutting out sugary sodas and drinks - referral to diabetes educator/nutritionalist Lab Results  Component Value Date   HGBA1C 6.7 (H) 12/17/2023         Relevant Orders   Microalbumin / creatinine urine ratio   Referral to Nutrition and Diabetes Services     Nervous and Auditory   Lumbar  radiculopathy     Other   Hyperlipidemia (Chronic)   Last lipid panel very poorly controlled Reviewed labs with her today on crestor  - better med compliance encouraged Recheck LP at next routine f/up visit in Feb 2026 Lab Results  Component Value Date   CHOL 314 (H) 12/17/2023   HDL 91 12/17/2023   LDLCALC 202 (H) 12/17/2023   TRIG 89 12/17/2023   CHOLHDL 3.5 12/17/2023         Weight loss, unintentional   Weight is improving after GERD/thrush/candidial esophagitis was treated again Wt Readings from Last 5 Encounters:  12/31/23 116 lb (52.6 kg)  12/17/23 116 lb (52.6 kg)  11/11/23 110 lb (49.9 kg)  10/25/23 121 lb (54.9 kg)  07/11/23 129 lb (58.5 kg)   BMI Readings from Last 5 Encounters:  12/31/23 18.72 kg/m  12/17/23 18.72 kg/m  11/11/23 17.75 kg/m  10/25/23 19.53 kg/m  07/11/23 20.20 kg/m   She has increased simple sugars and carbs in diet to try and regain weight and she will need to cut back on this and add in more protein and healthy fats instead due to increasing A1c and new onset T2DM       Relevant Orders   Referral to Nutrition and Diabetes Services       Michelene Cower, PA-C 12/31/23 11:30 AM

## 2023-12-31 NOTE — Assessment & Plan Note (Signed)
 Much better control of sx currently, continue PPI and GI f/up appt scheduled

## 2023-12-31 NOTE — Assessment & Plan Note (Signed)
 Currently controlled with BB, HR WNL, no palpitations Pulse Readings from Last 3 Encounters:  12/31/23 73  12/17/23 72  11/11/23 69

## 2023-12-31 NOTE — Assessment & Plan Note (Signed)
 Weight is improving after GERD/thrush/candidial esophagitis was treated again Wt Readings from Last 5 Encounters:  12/31/23 116 lb (52.6 kg)  12/17/23 116 lb (52.6 kg)  11/11/23 110 lb (49.9 kg)  10/25/23 121 lb (54.9 kg)  07/11/23 129 lb (58.5 kg)   BMI Readings from Last 5 Encounters:  12/31/23 18.72 kg/m  12/17/23 18.72 kg/m  11/11/23 17.75 kg/m  10/25/23 19.53 kg/m  07/11/23 20.20 kg/m   She has increased simple sugars and carbs in diet to try and regain weight and she will need to cut back on this and add in more protein and healthy fats instead due to increasing A1c and new onset T2DM

## 2023-12-31 NOTE — Assessment & Plan Note (Signed)
 Recurrent thrush, responded well to last treatment Will need to observe over time if flare ups are related to higher sugars - if so consider tx of new onset DM to also help manage thrush/esophageal candidiasis

## 2023-12-31 NOTE — Patient Instructions (Signed)
 I think your leg swelling is most likely dependent edema which can be worse when you are not moving around or walking as much. Elevate legs Wear compression socks Do low salt diet If the swelling is not continuing to gradually improve over the next 1-2 weeks then please come back to complete labs and get a follow visit.  Health Maintenance  Topic Date Due   Complete foot exam   Never done   Eye exam for diabetics  Never done   Yearly kidney health urinalysis for diabetes  Never done   Mammogram  02/22/2021   DEXA scan (bone density measurement)  02/22/2022   COVID-19 Vaccine (5 - 2024-25 season) 01/05/2023   Flu Shot  12/05/2023   Zoster (Shingles) Vaccine (1 of 2) 02/11/2024*   Pneumococcal Vaccine for age over 74 (1 of 2 - PCV) 02/19/2024*   DTaP/Tdap/Td vaccine (1 - Tdap) 11/10/2024*   Hemoglobin A1C  06/18/2024   Medicare Annual Wellness Visit  07/02/2024   Yearly kidney function blood test for diabetes  12/16/2024   Colon Cancer Screening  05/10/2027   Hepatitis C Screening  Completed   HPV Vaccine  Aged Out   Meningitis B Vaccine  Aged Out  *Topic was postponed. The date shown is not the original due date.

## 2023-12-31 NOTE — Assessment & Plan Note (Signed)
 Last lipid panel very poorly controlled Reviewed labs with her today on crestor  - better med compliance encouraged Recheck LP at next routine f/up visit in Feb 2026 Lab Results  Component Value Date   CHOL 314 (H) 12/17/2023   HDL 91 12/17/2023   LDLCALC 202 (H) 12/17/2023   TRIG 89 12/17/2023   CHOLHDL 3.5 12/17/2023

## 2024-01-01 ENCOUNTER — Ambulatory Visit: Payer: Self-pay | Admitting: Family Medicine

## 2024-01-01 LAB — MICROALBUMIN / CREATININE URINE RATIO
Creatinine, Urine: 126 mg/dL (ref 20–275)
Microalb Creat Ratio: 9 mg/g{creat} (ref <30)
Microalb, Ur: 1.1 mg/dL

## 2024-01-20 ENCOUNTER — Ambulatory Visit
Admission: RE | Admit: 2024-01-20 | Discharge: 2024-01-20 | Disposition: A | Discharge status: Home / Self Care | Source: Ambulatory Visit | Attending: Family Medicine

## 2024-01-20 ENCOUNTER — Ambulatory Visit: Payer: Self-pay | Admitting: Family Medicine

## 2024-01-20 ENCOUNTER — Ambulatory Visit
Admission: RE | Admit: 2024-01-20 | Discharge: 2024-01-20 | Disposition: A | Discharge status: Home / Self Care | Source: Ambulatory Visit | Attending: Family Medicine | Admitting: Family Medicine

## 2024-01-20 DIAGNOSIS — M810 Age-related osteoporosis without current pathological fracture: Secondary | ICD-10-CM | POA: Diagnosis not present

## 2024-01-20 DIAGNOSIS — Z1231 Encounter for screening mammogram for malignant neoplasm of breast: Secondary | ICD-10-CM | POA: Insufficient documentation

## 2024-01-20 DIAGNOSIS — Z780 Asymptomatic menopausal state: Secondary | ICD-10-CM | POA: Diagnosis not present

## 2024-01-20 DIAGNOSIS — Z9181 History of falling: Secondary | ICD-10-CM | POA: Insufficient documentation

## 2024-01-20 DIAGNOSIS — E559 Vitamin D deficiency, unspecified: Secondary | ICD-10-CM

## 2024-01-21 ENCOUNTER — Telehealth: Payer: Self-pay | Admitting: Family Medicine

## 2024-01-21 NOTE — Telephone Encounter (Signed)
 Copied from CRM [id number]. Topic: General - Call Back - No Documentation >> Jan 21, 2024  2:37 PM Precious C wrote: Reason for CRM: Patient called regarding a missed telephone call from yesterday. I informed the patient that the call was related to follow-up results for her bone density test. Provider left a message for CMA Clotilda to review the results with the patient.  I briefly went over the results with the patient, then reached out to CAL to check if Clotilda could go into more detail. Clotilda was unavailable at the time, as she was assisting other patients. I was advised to send a message requesting a call back.  Per CAL, Clotilda did attempt to call the patient but was unable to leave a voicemail, as there is no mailbox set up. Patient was informed that Clotilda may be able to return the call today, but if not, she should expect a call tomorrow.  Callback number: [phone].

## 2024-01-22 NOTE — Telephone Encounter (Signed)
 Called and 3, on third call got husband, but patient not home. Relayed results to husband and mailed a copy as well.

## 2024-01-22 NOTE — Telephone Encounter (Signed)
 Patient called back abut bone density results, I let her know of message she will be ontacted back today

## 2024-01-27 ENCOUNTER — Ambulatory Visit (INDEPENDENT_AMBULATORY_CARE_PROVIDER_SITE_OTHER): Admitting: Family Medicine

## 2024-01-27 ENCOUNTER — Encounter: Payer: Self-pay | Admitting: Family Medicine

## 2024-01-27 VITALS — BP 132/70 | HR 72 | Resp 16 | Ht 66.0 in | Wt 116.0 lb

## 2024-01-27 DIAGNOSIS — M1711 Unilateral primary osteoarthritis, right knee: Secondary | ICD-10-CM

## 2024-01-27 DIAGNOSIS — M810 Age-related osteoporosis without current pathological fracture: Secondary | ICD-10-CM

## 2024-01-27 DIAGNOSIS — Z7409 Other reduced mobility: Secondary | ICD-10-CM

## 2024-01-27 DIAGNOSIS — Z789 Other specified health status: Secondary | ICD-10-CM | POA: Diagnosis not present

## 2024-01-27 DIAGNOSIS — Z9181 History of falling: Secondary | ICD-10-CM | POA: Diagnosis not present

## 2024-01-27 NOTE — Patient Instructions (Signed)
 Bisphosphonates - medicines to strengthen bones and prevent fracture.  Ask your dentist if they have any concerns with you taking fosamax  for osteoporosis.  Take medication once a week, drink a full glass of water  afterwards and do not sit lay down for 1 hour or more afterwards because of rare medication side effect that can cause problems swallowing.  We can ask your GI doctor about this as well.

## 2024-01-27 NOTE — Progress Notes (Unsigned)
 Patient ID: Ebony [name], female    DOB: Nov 25, 1950, 73 y.o.   MRN: [id number]  PCP: Leavy Mole, PA-C  Chief Complaint  Patient presents with   Osteoporosis    Subjective:   Ebony [name] is a 73 y.o. female, presents to clinic with CC of the following:  HPI  Here to discuss dexa results MM 3D SCREENING MAMMOGRAM BILATERAL BREAST CLINICAL DATA:  Screening.  EXAM: DIGITAL SCREENING BILATERAL MAMMOGRAM WITH TOMOSYNTHESIS AND CAD  TECHNIQUE: Bilateral screening digital craniocaudal and mediolateral oblique mammograms were obtained. Bilateral screening digital breast tomosynthesis was performed. The images were evaluated with computer-aided detection.  COMPARISON:  Previous exam(s).  ACR Breast Density Category c: The breasts are heterogeneously dense, which may obscure small masses.  FINDINGS: There are no findings suspicious for malignancy.  IMPRESSION: No mammographic evidence of malignancy. A result letter of this screening mammogram will be mailed directly to the patient.  RECOMMENDATION: Screening mammogram in one year. (Code:[id number])  BI-RADS CATEGORY  1: Negative.  Electronically Signed   By: Dina  Arceo M.D.   On: 01/21/2024 13:14  Discussed the use of AI scribe software for clinical note transcription with the patient, who gave verbal consent to proceed.  History of Present Illness Ebony [name] is a 73 year old female with osteoporosis and osteoarthritis who presents with impaired mobility and knee pain.  Knee pain and impaired mobility - Significant knee pain, particularly in the right knee, described as 'bone on bone' - Pain affects mobility, especially in the mornings - Cortisone and gel injections administered for knee pain; cortisone provided more effective relief despite initial increased pain - No use of walker or cane, but considering these options to prevent falls - Sometimes uses her husband's bathroom for support due to mobility  difficulties  Osteoporosis and fracture risk - Diagnosed with osteoporosis, resulting in brittle bones and increased risk of fractures, particularly hip fractures - No history of frequent fractures - No use of bone-strengthening medications such as Fosamax , alendronate , or Prolia - Concerned about potential for hip fracture  Functional limitations and fall risk - Difficulty with mobility, especially in the mornings - No current use of assistive devices, but considering walker or cane to prevent falls  Family history of arthritis - Both parents and two aunts affected by arthritis - Concern about becoming 'crippled' like relatives  Dentition - Has dentures    Patient Active Problem List   Diagnosis Date Noted   Osteoporosis without current pathological fracture 01/20/2024   At high risk for injury related to fall 01/20/2024   Type 2 diabetes mellitus without complication, without long-term current use of insulin (HCC) 12/31/2023   Thrush 12/31/2023   Paroxysmal SVT (supraventricular tachycardia) 02/19/2023   Single subsegmental pulmonary embolism without acute cor pulmonale (HCC) 01/01/2023   Seasonal allergic rhinitis 08/13/2022   Candida infection, esophageal (HCC) 08/13/2022   Arthritis of left knee 12/14/2020   Lumbar radiculopathy 12/14/2020   Lumbar spondylosis 12/14/2020   Gastroesophageal reflux disease 05/02/2020   B12 deficiency 12/30/2018   Hyperlipidemia 07/01/2018   Neutropenia, unspecified type 12/18/2016   Weight loss, unintentional 12/17/2016   Arthritis of knee, degenerative 07/18/2015      Current Outpatient Medications:    acetaminophen  (TYLENOL ) 500 MG tablet, Take 1-2 tablets (500-1,000 mg total) by mouth every 8 (eight) hours as needed for moderate pain. OTC, Disp: 30 tablet, Rfl: 0   BIOTIN PO, Take 1 capsule by mouth daily at 6 (six) AM., Disp: ,  Rfl:    Calcium  Carbonate-Vit D-Min (CALCIUM  1200 PO), Take 600 mg by mouth daily. , Disp: , Rfl:     Cholecalciferol (VITAMIN D ) 125 MCG (5000 UT) CAPS, Take 1 capsule by mouth daily., Disp: , Rfl:    CINNAMON PO, Take by mouth., Disp: , Rfl:    Cyanocobalamin  (VITAMIN B-12) 1000 MCG SUBL, Place 1,000 mcg under the tongue daily. , Disp: , Rfl:    Evolocumab  (REPATHA  SURECLICK) 140 MG/ML SOAJ, Inject 140 mg into the skin every 14 (fourteen) days., Disp: 6 mL, Rfl: 3   fluconazole  (DIFLUCAN ) 150 MG tablet, Take 1 tablet (150 mg total) by mouth once a week for 8 doses. For recurrent oral and esophageal yeast infections, Disp: 8 tablet, Rfl: 0   ipratropium (ATROVENT ) 0.03 % nasal spray, Place 2 sprays into both nostrils every 12 (twelve) hours., Disp: 30 mL, Rfl: 12   levocetirizine (XYZAL ) 5 MG tablet, Take 1 tablet (5 mg total) by mouth every evening., Disp: 30 tablet, Rfl: 11   lidocaine  (LIDODERM ) 5 %, Place 1 patch onto the skin daily. As needed for msk pain, Disp: 30 patch, Rfl: 2   meloxicam (MOBIC) 7.5 MG tablet, Take 7.5 mg by mouth daily., Disp: , Rfl:    metoprolol  succinate (TOPROL -XL) 25 MG 24 hr tablet, TAKE 1 TABLET EVERY DAY WITH BREAKFAST, Disp: 90 tablet, Rfl: 0   Omega-3 Fatty Acids (FISH OIL PO), Take 1 capsule by mouth daily at 6 (six) AM., Disp: , Rfl:    pantoprazole  (PROTONIX ) 40 MG tablet, TAKE 1 TABLET (40 MG TOTAL) BY MOUTH DAILY AS NEEDED FOR GERD/ACID REFLUX, Disp: 90 tablet, Rfl: 3   REFRESH CELLUVISC 1 % GEL, , Disp: , Rfl:    RESTASIS 0.05 % ophthalmic emulsion, , Disp: , Rfl:    rosuvastatin  (CRESTOR ) 10 MG tablet, TAKE 1 TABLET EVERY DAY (DOSE CHANGE), Disp: 90 tablet, Rfl: 3   tiZANidine  (ZANAFLEX ) 4 MG tablet, Take 1 tablet (4 mg total) by mouth every 8 (eight) hours as needed for muscle spasms (muscle tightness)., Disp: 30 tablet, Rfl: 0   traMADol  (ULTRAM ) 50 MG tablet, Take 1 tablet (50 mg total) by mouth every 6 (six) hours as needed., Disp: 20 tablet, Rfl: 0   TURMERIC PO, Take by mouth., Disp: , Rfl:    TYRVAYA 0.03 MG/ACT SOLN, , Disp: , Rfl:    Allergies   Allergen Reactions   Celecoxib Anxiety, Itching and Other (See Comments)   Flonase  [Fluticasone ] Other (See Comments)    Candidal infection when she uses steroids - severe to throat and esophagus - avoid intranasal steroid sprays      Social History   Tobacco Use   Smoking status: Never   Smokeless tobacco: Never  Vaping Use   Vaping status: Never Used  Substance Use Topics   Alcohol use: No   Drug use: No      Chart Review Today: I personally reviewed active problem list, medication list, allergies, family history, social history, health maintenance, notes from last encounter, lab results, imaging with the patient/caregiver today.   Review of Systems  Constitutional: Negative.   HENT: Negative.    Eyes: Negative.   Respiratory: Negative.    Cardiovascular: Negative.   Gastrointestinal: Negative.   Endocrine: Negative.   Genitourinary: Negative.   Musculoskeletal: Negative.   Skin: Negative.   Allergic/Immunologic: Negative.   Neurological: Negative.   Hematological: Negative.   Psychiatric/Behavioral: Negative.    All other systems reviewed and are negative.  Objective:   Vitals:   01/27/24 0826  BP: 132/70  Pulse: 72  Resp: 16  SpO2: 98%  Weight: 116 lb (52.6 kg)  Height: 5' 6 (1.676 m)    Body mass index is 18.72 kg/m.  Physical Exam Vitals and nursing note reviewed.  Constitutional:      General: She is not in acute distress.    Appearance: Normal appearance. She is well-developed. She is not ill-appearing, toxic-appearing or diaphoretic.  HENT:     Head: Normocephalic and atraumatic.     Right Ear: External ear normal.     Left Ear: External ear normal.     Nose: Nose normal.  Eyes:     General: No scleral icterus.       Right eye: No discharge.        Left eye: No discharge.     Conjunctiva/sclera: Conjunctivae normal.  Neck:     Trachea: No tracheal deviation.  Cardiovascular:     Rate and Rhythm: Normal rate.  Pulmonary:      Effort: Pulmonary effort is normal. No respiratory distress.     Breath sounds: No stridor.  Musculoskeletal:     Right knee: Swelling present. Decreased range of motion.  Skin:    General: Skin is warm and dry.     Findings: No rash.  Neurological:     Mental Status: She is alert. Mental status is at baseline.     Motor: No abnormal muscle tone.     Coordination: Coordination normal.     Gait: Gait abnormal.  Psychiatric:        Mood and Affect: Mood normal.        Behavior: Behavior normal.      Results for orders placed or performed in visit on 12/31/23  Microalbumin / creatinine urine ratio   Collection Time: 12/31/23 11:53 AM  Result Value Ref Range   Creatinine, Urine 126 20 - 275 mg/dL   Microalb, Ur 1.1 mg/dL   Microalb Creat Ratio 9 <30 mg/g creat       Assessment & Plan:   1. Osteoporosis without current pathological fracture, unspecified osteoporosis type (Primary) *** - For home use only DME Other see comment  2. At high risk for injury related to fall *** - For home use only DME Other see comment  3. Osteoarthritis of right knee, unspecified osteoarthritis type *** - For home use only DME Other see comment  4. Impaired mobility and activities of daily living *** - For home use only DME Other see comment   Assessment and Plan Assessment & Plan Osteoporosis with high risk of falls Osteoporosis with increased bone brittleness, raising the risk of fractures, particularly hip fractures. Emphasized the importance of fall prevention due to the high risk of morbidity and mortality associated with hip fractures. Discussed the benefits of medications like Fosamax  to strengthen bones by inhibiting osteoclast activity and promoting bone density. Highlighted the need for careful consideration of the risks and benefits of starting such medications, especially given her dysphagia and potential dental concerns. Discussed the importance of optimizing vitamin D  and  calcium  levels to support bone health. Emphasized the need for assistive devices and home modifications to prevent falls. - Initiate Fosamax  once weekly with a full glass of water , ensuring she remains upright after ingestion. - Check vitamin D  and calcium  levels to ensure they are within optimal range before starting Fosamax . - Discuss with her GI doctor regarding the safety of Fosamax  given her dysphagia. -  Consult with her dentist to ensure no contraindications for Fosamax  use due to dental health. - Order a rolling walker to aid in mobility and prevent falls. - Encourage installation of handrails and use of assistive devices at home to prevent falls.  Knee osteoarthritis, right worse than left, with impaired mobility Severe osteoarthritis in the knees, particularly the right knee, causing significant pain and impaired mobility. Discussed the potential benefits of a total knee replacement, emphasizing improved mobility and reduced pain post-surgery compared to current bone-on-bone grinding. Highlighted the importance of addressing knee issues to prevent falls and improve quality of life. Discussed the risks and benefits of knee replacement surgery, including potential complications and the importance of optimizing health prior to surgery. Emphasized that a planned elective knee replacement is safer and has better outcomes than emergency surgery following a fall. Discussed the need for thorough preoperative evaluation and optimization to minimize surgical risks. - Discuss knee replacement surgery with her orthopedic surgeon, focusing on risks, benefits, and expected outcomes. - Consider physical therapy to assess and recommend appropriate assistive devices for mobility. - Ensure she has access to a rolling walker to aid in mobility and prevent falls.  Recording duration: 21 minutes     Michelene Cower, PA-C 01/27/24 2:19 PM

## 2024-01-28 LAB — COMPREHENSIVE METABOLIC PANEL WITH GFR
AG Ratio: 1.8 (calc) (ref 1.0–2.5)
ALT: 6 U/L (ref 6–29)
AST: 12 U/L (ref 10–35)
Albumin: 3.6 g/dL (ref 3.6–5.1)
Alkaline phosphatase (APISO): 70 U/L (ref 37–153)
BUN/Creatinine Ratio: 24 (calc) — ABNORMAL HIGH (ref 6–22)
BUN: 9 mg/dL (ref 7–25)
CO2: 30 mmol/L (ref 20–32)
Calcium: 8.6 mg/dL (ref 8.6–10.4)
Chloride: 106 mmol/L (ref 98–110)
Creat: 0.37 mg/dL — ABNORMAL LOW (ref 0.60–1.00)
Globulin: 2 g/dL (ref 1.9–3.7)
Glucose, Bld: 102 mg/dL — ABNORMAL HIGH (ref 65–99)
Potassium: 3.9 mmol/L (ref 3.5–5.3)
Sodium: 142 mmol/L (ref 135–146)
Total Bilirubin: 0.4 mg/dL (ref 0.2–1.2)
Total Protein: 5.6 g/dL — ABNORMAL LOW (ref 6.1–8.1)
eGFR: 106 mL/min/1.73m2 (ref >=60)

## 2024-01-28 LAB — VITAMIN D 25 HYDROXY (VIT D DEFICIENCY, FRACTURES): Vit D, 25-Hydroxy: 29 ng/mL — ABNORMAL LOW (ref 30–100)

## 2024-01-28 LAB — PARATHYROID HORMONE, INTACT (NO CA): PTH: 47 pg/mL (ref 16–77)

## 2024-01-29 MED ORDER — ALENDRONATE SODIUM 70 MG PO TABS: 70.0000 mg | ORAL_TABLET | ORAL | 3 refills | Status: AC

## 2024-01-29 MED ORDER — VITAMIN D (ERGOCALCIFEROL) 1.25 MG (50000 UNIT) PO CAPS: 50000.0000 [IU] | ORAL_CAPSULE | ORAL | 1 refills | Status: AC

## 2024-01-29 NOTE — Addendum Note (Signed)
 Addended by: Demani Mcbrien on: 01/29/2024 04:03 AM   Modules accepted: Orders

## 2024-02-02 DIAGNOSIS — I10 Essential (primary) hypertension: Secondary | ICD-10-CM | POA: Diagnosis not present

## 2024-02-02 DIAGNOSIS — J302 Other seasonal allergic rhinitis: Secondary | ICD-10-CM | POA: Diagnosis not present

## 2024-02-02 DIAGNOSIS — M5440 Lumbago with sciatica, unspecified side: Secondary | ICD-10-CM | POA: Diagnosis not present

## 2024-02-02 DIAGNOSIS — K219 Gastro-esophageal reflux disease without esophagitis: Secondary | ICD-10-CM | POA: Diagnosis not present

## 2024-02-02 DIAGNOSIS — M2010 Hallux valgus (acquired), unspecified foot: Secondary | ICD-10-CM | POA: Diagnosis not present

## 2024-02-02 DIAGNOSIS — Z8249 Family history of ischemic heart disease and other diseases of the circulatory system: Secondary | ICD-10-CM | POA: Diagnosis not present

## 2024-02-02 DIAGNOSIS — I471 Supraventricular tachycardia, unspecified: Secondary | ICD-10-CM | POA: Diagnosis not present

## 2024-02-02 DIAGNOSIS — M810 Age-related osteoporosis without current pathological fracture: Secondary | ICD-10-CM | POA: Diagnosis not present

## 2024-02-02 DIAGNOSIS — K224 Dyskinesia of esophagus: Secondary | ICD-10-CM | POA: Diagnosis not present

## 2024-02-02 DIAGNOSIS — H269 Unspecified cataract: Secondary | ICD-10-CM | POA: Diagnosis not present

## 2024-02-02 DIAGNOSIS — M1990 Unspecified osteoarthritis, unspecified site: Secondary | ICD-10-CM | POA: Diagnosis not present

## 2024-02-02 DIAGNOSIS — E785 Hyperlipidemia, unspecified: Secondary | ICD-10-CM | POA: Diagnosis not present

## 2024-02-02 DIAGNOSIS — Z818 Family history of other mental and behavioral disorders: Secondary | ICD-10-CM | POA: Diagnosis not present

## 2024-02-02 DIAGNOSIS — M62838 Other muscle spasm: Secondary | ICD-10-CM | POA: Diagnosis not present

## 2024-02-02 DIAGNOSIS — I700 Atherosclerosis of aorta: Secondary | ICD-10-CM | POA: Diagnosis not present

## 2024-02-02 DIAGNOSIS — R32 Unspecified urinary incontinence: Secondary | ICD-10-CM | POA: Diagnosis not present

## 2024-02-02 DIAGNOSIS — K5900 Constipation, unspecified: Secondary | ICD-10-CM | POA: Diagnosis not present

## 2024-02-02 DIAGNOSIS — B372 Candidiasis of skin and nail: Secondary | ICD-10-CM | POA: Diagnosis not present

## 2024-02-02 DIAGNOSIS — F329 Major depressive disorder, single episode, unspecified: Secondary | ICD-10-CM | POA: Diagnosis not present

## 2024-02-18 ENCOUNTER — Encounter: Payer: Self-pay | Admitting: Dietician

## 2024-02-18 ENCOUNTER — Encounter: Attending: Family Medicine | Admitting: Dietician

## 2024-02-18 VITALS — Ht 66.0 in | Wt 128.3 lb

## 2024-02-18 DIAGNOSIS — R634 Abnormal weight loss: Secondary | ICD-10-CM | POA: Diagnosis not present

## 2024-02-18 DIAGNOSIS — Z6820 Body mass index (BMI) 20.0-20.9, adult: Secondary | ICD-10-CM | POA: Insufficient documentation

## 2024-02-18 DIAGNOSIS — E119 Type 2 diabetes mellitus without complications: Secondary | ICD-10-CM | POA: Insufficient documentation

## 2024-02-18 DIAGNOSIS — Z713 Dietary counseling and surveillance: Secondary | ICD-10-CM | POA: Insufficient documentation

## 2024-02-18 DIAGNOSIS — M170 Bilateral primary osteoarthritis of knee: Secondary | ICD-10-CM | POA: Diagnosis not present

## 2024-02-18 NOTE — Progress Notes (Signed)
 Diabetes Self-Management Education  Visit Type: First/Initial  Appt. Start Time: 1100 Appt. End Time: 1205  02/18/2024  [name], identified by name and date of birth, is a 73 y.o. female with a diagnosis of Diabetes: Type 2.   ASSESSMENT Height 5' 6 (1.676 m), weight 128 lb 4.8 oz (58.2 kg). Body mass index is 20.71 kg/m.  Pt reports wanting to gain weight, reports high amount of stress this year and lost significant weight due to decline in appetite since high stress started, states they lost taste for most things, lowest weight reported of 106 lbs. Pt reports beginning to regain weight over the last couple of months, trying to eat more now. Pt states that they used to graze all day, always had a good appetite previously. Pt reports lack of mobility due to needing R knee replacement, frustrated that they can't be as active when stressed due to knee pain. Pt states they are in the process of getting set up for full replacement surgery.   Diabetes Self-Management Education - 02/18/24 1114       Visit Information   Visit Type First/Initial      Initial Visit   Diabetes Type Type 2    Date Diagnosed 12/17/2023    Are you currently following a meal plan? No    Are you taking your medications as prescribed? Not on Medications      Health Coping   How would you rate your overall health? Good      Psychosocial Assessment   Patient Belief/Attitude about Diabetes Afraid    What is the hardest part about your diabetes right now, causing you the most concern, or is the most worrisome to you about your diabetes?   Making healty food and beverage choices;Checking blood sugar;Getting support / problem solving    Self-care barriers Debilitated state due to current medical condition   R knee needs replacment, limited mobility   Self-management support Doctor's office    Other persons present Patient    Patient Concerns Nutrition/Meal planning;Weight Control;Healthy Lifestyle    Gaining weight   Special Needs None    Preferred Learning Style Auditory;Hands on    Learning Readiness Ready    How often do you need to have someone help you when you read instructions, pamphlets, or other written materials from your doctor or pharmacy? 1 - Never    What is the last grade level you completed in school? 12th grade      Pre-Education Assessment   Patient understands the diabetes disease and treatment process. Needs Instruction    Patient understands incorporating nutritional management into lifestyle. Needs Instruction    Patient undertands incorporating physical activity into lifestyle. Needs Instruction    Patient understands using medications safely. N/A (comment)    Patient understands monitoring blood glucose, interpreting and using results N/A (comment)    Patient understands prevention, detection, and treatment of acute complications. Needs Instruction    Patient understands prevention, detection, and treatment of chronic complications. Needs Instruction    Patient understands how to develop strategies to address psychosocial issues. Needs Instruction    Patient understands how to develop strategies to promote health/change behavior. Needs Instruction      Complications   Last HgB A1C per patient/outside source 6.7 %   12/17/2023   How often do you check your blood sugar? Not recommended by provider    Have you had a dilated eye exam in the past 12 months? No    Have you  had a dental exam in the past 12 months? No    Are you checking your feet? No      Dietary Intake   Breakfast Coffee, Pack of PB nabs    Snack (afternoon) 6 pack of Oreos    Dinner Shrimp and broccoli pasta    Beverage(s) Coffee, sweet tea, water       Activity / Exercise   Activity / Exercise Type ADL's      Patient Education   Previous Diabetes Education No    Disease Pathophysiology Explored patient's options for treatment of their diabetes    Healthy Eating Role of diet in the treatment  of diabetes and the relationship between the three main macronutrients and blood glucose level;Meal options for control of blood glucose level and chronic complications.   Healthy fats/protein for weight gain   Chronic complications Relationship between chronic complications and blood glucose control    Diabetes Stress and Support Identified and addressed patients feelings and concerns about diabetes;Worked with patient to identify barriers to care and solutions;Role of stress on diabetes    Lifestyle and Health Coping Lifestyle issues that need to be addressed for better diabetes care      Individualized Goals (developed by patient)   Nutrition Follow meal plan discussed    Physical Activity Not Applicable    Medications Not Applicable    Monitoring  Not Applicable    Problem Solving Eating Pattern    Reducing Risk Not Applicable    Health Coping Ask for help with psychological, social, or emotional issues;Discuss barriers to diabetes care with support person/system (comment specifics as needed)      Post-Education Assessment   Patient understands the diabetes disease and treatment process. Needs Review    Patient understands incorporating nutritional management into lifestyle. Comprehends key points    Patient undertands incorporating physical activity into lifestyle. N/A    Patient understands using medications safely. N/A    Patient understands monitoring blood glucose, interpreting and using results N/A    Patient understands prevention, detection, and treatment of acute complications. N/A    Patient understands prevention, detection, and treatment of chronic complications. Needs Review    Patient understands how to develop strategies to address psychosocial issues. Needs Review    Patient understands how to develop strategies to promote health/change behavior. Comprehends key points      Outcomes   Expected Outcomes Demonstrated interest in learning. Expect positive outcomes     Future DMSE 4-6 wks    Program Status Not Completed          Individualized Plan for Diabetes Self-Management Training:   Learning Objective:  Patient will have a greater understanding of diabetes self-management. Patient education plan is to attend individual and/or group sessions per assessed needs and concerns.   Plan:   Patient Instructions  Premier Protein coffee flavor shake to add to your coffee in the morning.  Choose high protein foods to snack on during the day like sliced [place], yogurt, peanut butter and crackers, pecans, boiled eggs, protein bars  Try [place] yogurt in place of your [place] yogurt!  Try BUILT Puff protein bars, try the Coconut flavor for an [employer] like bar!  Expected Outcomes:  Demonstrated interest in learning. Expect positive outcomes  Education material provided: Protein Foods List, Snack Ideas  If problems or questions, patient to contact team via:  Phone and Email  Future DSME appointment: 4-6 wks

## 2024-02-18 NOTE — Patient Instructions (Addendum)
 Premier Protein coffee flavor shake to add to your coffee in the morning.  Choose high protein foods to snack on during the day like sliced [place], yogurt, peanut butter and crackers, pecans, boiled eggs, protein bars  Try [place] yogurt in place of your [place] yogurt!  Try BUILT Puff protein bars, try the [place]ut flavor for an [employer] like bar!

## 2024-02-23 ENCOUNTER — Other Ambulatory Visit: Payer: Self-pay | Admitting: Family Medicine

## 2024-02-23 ENCOUNTER — Other Ambulatory Visit: Payer: Self-pay

## 2024-02-23 DIAGNOSIS — E119 Type 2 diabetes mellitus without complications: Secondary | ICD-10-CM

## 2024-02-23 DIAGNOSIS — J329 Chronic sinusitis, unspecified: Secondary | ICD-10-CM

## 2024-02-24 NOTE — Telephone Encounter (Signed)
 Requested medications are due for refill today.  yes  Requested medications are on the active medications list.  yes  Last refill. 01/01/2023 30mL 12 rf  Future visit scheduled.   yes  Notes to clinic.  Medication not assigned to a protocol. Please review for refill    Requested Prescriptions  Pending Prescriptions Disp Refills   ipratropium (ATROVENT ) 0.03 % nasal spray [Pharmacy Med Name: IPRATROPIUM 0.03% NAS SP30ML (345)] 30 mL 12    Sig: USE 2 SPRAYS IN EACH NOSTRIL EVERY 12 HOURS     Off-Protocol Failed - 02/24/2024  5:54 PM      Failed - Medication not assigned to a protocol, review manually.      Passed - Valid encounter within last 12 months    Recent Outpatient Visits           4 weeks ago Osteoporosis without current pathological fracture, unspecified osteoporosis type   [facility] Leavy Mole, PA-C   1 month ago Bilateral lower extremity edema   [facility] Leavy Mole, PA-C   2 months ago Weight loss, unintentional   [facility] Leavy Mole, PA-C   3 months ago Weight loss, unintentional   [facility] Leavy Mole, [place]       Future Appointments             In 3 months Leavy Mole, PA-C [facility], [place]           Off-Protocol Failed - 02/24/2024  5:54 PM      Failed - Medication not assigned to a protocol, review manually.      Passed - Valid encounter within last 12 months    Recent Outpatient Visits           4 weeks ago Osteoporosis without current pathological fracture, unspecified osteoporosis type   [facility] Leavy Mole, PA-C   1 month ago Bilateral lower extremity edema   [facility] Leavy Mole, PA-C   2 months ago Weight loss, unintentional   [facility] Leavy Mole, PA-C   3 months ago Weight loss,  unintentional   [facility] Leavy Mole, [place]       Future Appointments             In 3 months Leavy Mole, PA-C [facility], [place]

## 2024-02-25 ENCOUNTER — Ambulatory Visit: Payer: Self-pay | Admitting: Family Medicine

## 2024-02-25 NOTE — Telephone Encounter (Signed)
 FYI Only or Action Required?: Action required by provider: request for appointment.  Patient was last seen in primary care on 01/27/2024 by Ebony Mole, PA-C.  Called Nurse Triage reporting Tingling to legs.  Symptoms began several months ago.  Interventions attempted: Rest, hydration, or home remedies.  Symptoms are: unchanged.  Triage Disposition: See PCP When Office is Open (Within 3 Days)  Patient/caregiver understands and will follow disposition?: Yes     Copied from CRM [id number]. Topic: Clinical - Red Word Triage >> Feb 25, 2024 10:49 AM Hadassah PARAS wrote: Red Word that prompted transfer to Nurse Triage: Pt is urinating frequntly, tinlging and numbness for 1 month; Dietician and pharamacy stated pt may have neuropathy should follow up w pcp Reason for Disposition  [1] Numbness or tingling in one or both feet AND [2] is a chronic symptom (recurrent or ongoing AND present > 4 weeks)  Answer Assessment - Initial Assessment Questions 1. SYMPTOM: What is the main symptom you are concerned about? (e.g., weakness, numbness)     tingling 2. ONSET: When did this start? (e.g., minutes, hours, days; while sleeping)     1-2 months 3. LAST NORMAL: When was the last time you (the patient) were normal (no symptoms)?     2 months 4. PATTERN Does this come and go, or has it been constant since it started?  Is it present now?     constant 5. CARDIAC SYMPTOMS: Have you had any of the following symptoms: chest pain, difficulty breathing, palpitations?     no 6. NEUROLOGIC SYMPTOMS: Have you had any of the following symptoms: headache, dizziness, vision loss, double vision, changes in speech, unsteady on your feet?     no 7. OTHER SYMPTOMS: Do you have any other symptoms?     Urinary frequency 8. PREGNANCY: Is there any chance you are pregnant? When was your last menstrual period?     no  Protocols used: Neurologic Deficit-A-AH

## 2024-03-04 DIAGNOSIS — Z20822 Contact with and (suspected) exposure to covid-19: Secondary | ICD-10-CM | POA: Diagnosis not present

## 2024-03-05 ENCOUNTER — Ambulatory Visit: Admitting: Internal Medicine

## 2024-03-08 ENCOUNTER — Other Ambulatory Visit: Payer: Self-pay | Admitting: Cardiovascular Disease

## 2024-03-08 DIAGNOSIS — I471 Supraventricular tachycardia, unspecified: Secondary | ICD-10-CM

## 2024-03-08 DIAGNOSIS — R002 Palpitations: Secondary | ICD-10-CM

## 2024-03-10 ENCOUNTER — Ambulatory Visit: Admitting: Family Medicine

## 2024-03-16 ENCOUNTER — Ambulatory Visit: Admitting: Internal Medicine

## 2024-03-16 ENCOUNTER — Other Ambulatory Visit: Payer: Self-pay

## 2024-03-16 ENCOUNTER — Encounter: Payer: Self-pay | Admitting: Internal Medicine

## 2024-03-16 VITALS — BP 120/68 | HR 85 | Temp 97.8°F | Resp 16 | Ht 66.0 in | Wt 123.0 lb

## 2024-03-16 DIAGNOSIS — M1711 Unilateral primary osteoarthritis, right knee: Secondary | ICD-10-CM

## 2024-03-16 DIAGNOSIS — E441 Mild protein-calorie malnutrition: Secondary | ICD-10-CM

## 2024-03-16 DIAGNOSIS — E119 Type 2 diabetes mellitus without complications: Secondary | ICD-10-CM | POA: Diagnosis not present

## 2024-03-16 DIAGNOSIS — E1165 Type 2 diabetes mellitus with hyperglycemia: Secondary | ICD-10-CM

## 2024-03-16 DIAGNOSIS — M21619 Bunion of unspecified foot: Secondary | ICD-10-CM | POA: Diagnosis not present
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
## 2024-03-22 LAB — OPHTHALMOLOGY REPORT-SCANNED

## 2024-04-03 ENCOUNTER — Other Ambulatory Visit: Payer: Self-pay | Admitting: Cardiovascular Disease

## 2024-04-03 DIAGNOSIS — I471 Supraventricular tachycardia, unspecified: Secondary | ICD-10-CM

## 2024-04-03 DIAGNOSIS — R002 Palpitations: Secondary | ICD-10-CM

## 2024-04-07 ENCOUNTER — Encounter: Admitting: Dietician

## 2024-04-08 ENCOUNTER — Ambulatory Visit: Payer: Self-pay

## 2024-04-08 NOTE — Telephone Encounter (Signed)
 FYI Only or Action Required?: FYI only for provider: UC advised.  Patient was last seen in primary care on 03/16/2024 by Bernardo Fend, DO.  Called Nurse Triage reporting Cough.  Symptoms began a week ago.  Interventions attempted: OTC medications: coricidin.  Symptoms are: gradually worsening.  Triage Disposition: See HCP Within 4 Hours (Or PCP Triage)  Patient/caregiver understands and will follow disposition?: Yes   Copied from CRM [id number]. Topic: Clinical - Medical Advice >> Apr 08, 2024  8:32 AM Ebony [name] wrote: Reason for CRM: Patient has had a cold for the last week. Taking Coricidin every 4 hours but it is not helping. Also taking Tylenol  sinus medicine without relief. Not sleeping. No fever. Patient would like antibiotic or cough medicine called in to treat symptoms. Patient does not use mychart. Patient  CB#[phone] Reason for Disposition  Wheezing is present  Answer Assessment - Initial Assessment Questions 2 weeks ago pt had a sinus infection that she self treated with sudafed. She states typically that kicks it but then a week ago she began to have more of a cold. Then she noticed her eyes were red so she went to the eye doctor and he said she had pink eye. She said she has never had that before and he told her untreated sinus infection can turn into pink eye. She states she has also had a cough, been taking coricidin but it isn't helping. Coughing up lime green mucus, feels her chest vibrations, then said she is having wheezing. D/t schedule availability RN advised pt to go to the UC. Pt stated understanding.      1. ONSET: When did the cough begin?      About a week ago 2. SEVERITY: How bad is the cough today?      moderate 3. SPUTUM: Describe the color of your sputum (e.g., none, dry cough; clear, white, yellow, green)     Lime green 4. DIFFICULTY BREATHING: Are you having difficulty breathing? If Yes, ask: How bad is it? (e.g., mild, moderate,  severe)      denies 5. FEVER: Do you have a fever? If Yes, ask: What is your temperature, how was it measured, and when did it start?     denies 6. CARDIAC HISTORY: Do you have any history of heart disease? (e.g., heart attack, congestive heart failure)      SVT 8. LUNG HISTORY: Do you have any history of lung disease?  (e.g., pulmonary embolus, asthma, emphysema)     PE 9. PE RISK FACTORS: Do you have a history of blood clots? (or: recent major surgery, recent prolonged travel, bedridden)     Hx of PE 10. OTHER SYMPTOMS: Do you have any other symptoms? (e.g., runny nose, wheezing, chest pain)       wheezing  Protocols used: Cough - Acute Productive-A-AH

## 2024-04-22 ENCOUNTER — Encounter: Admitting: Dietician

## 2024-05-04 ENCOUNTER — Ambulatory Visit: Payer: Self-pay

## 2024-05-04 DIAGNOSIS — I471 Supraventricular tachycardia, unspecified: Secondary | ICD-10-CM

## 2024-05-04 NOTE — ED Triage Notes (Signed)
 Pt coming in POV for heart palpitations.  - Pt doesn't have CHF but has bilateral leg swelling

## 2024-05-04 NOTE — ED Provider Notes (Signed)
 [facility] Emergency Department Provider Note  HPI & Impression   Final diagnoses:  Palpitations (Primary)    History of Present Illness Ebony [name] is a 73 year old female who presents with worsening leg swelling.  She has had brief episodes of heart palpitations since mid-year, each lasting about 10 to 12 seconds, without chest pain or shortness of breath. She takes metoprolol . She noticed bilateral leg swelling about 1.5 weeks ago, with progressive worsening. She is not taking any diuretics. Swelling is significant enough that compression socks leave prominent indentations.  MDM   Vitals: BP 114/86   Pulse 76   Temp 36.5 C (97.7 F) (Skin)   Resp 20   Wt 54.9 kg (121 lb)   SpO2 98%    Medical Decision Making This is a 73 year old female presenting with gradually worsening bilateral lower extremity edema for approximately one and a half weeks and intermittent heart palpitations since mid-year. She denies chest pain or shortness of breath. Examination reveals 2+ pitting edema to the knees bilaterally and clear lung sounds. Laboratory studies, including serial troponins, electrolytes, and EKG, are within normal limits.  Differential diagnosis includes, but is not limited to: - Congestive heart failure exacerbation: Progressive lower extremity edema, history of heart disease, and physical findings are consistent with mild congestive heart failure exacerbation without respiratory compromise. - Acute coronary syndrome: Acute coronary syndrome is less likely given the absence of chest pain, normal EKG, and normal serial troponins.  Congestive heart failure exacerbation with heart palpitations - Advised elevating legs above heart level when sleeping. - Advised use of compression socks. - Recommended follow-up with cardiologist for discussion of repeat echocardiogram and trial of furosemide (Lasix) to manage fluid overload.  Diagnostics: Orders Placed This Encounter   Procedures   XR Chest 2 views   CBC w/ Differential   Comprehensive Metabolic Panel   PT-INR   hsTroponin I (serial 0-2-6H w/ delta)   hsTroponin I - 2 Hour   Pro-BNP   ECG 12 Lead   ECG 12 Lead   Insert peripheral IV       The case was discussed with the attending physician who is in agreement with the above assessment and plan.  Additional MDM Elements     Discussion with other professionals: See MDM above Independent interpretation: See MDM above          Physical Exam   Vitals:   05/04/24 1548 05/04/24 1550 05/04/24 2004  BP:  141/87 114/86  Pulse: 87  76  Resp: 18  20  Temp: 36.5 C (97.7 F)    TempSrc: Skin  Skin  SpO2: 100%  98%  Weight: 54.9 kg (121 lb)       Physical Exam CONSTITUTIONAL: Well-appearing, NAD. HEENT: Normocephalic and atraumatic. Conjunctivae are normal. EOMI. Mucous membranes are moist. NECK: Full active ROM. CARDIOVASCULAR: No murmurs.  Extremities are warm and well-perfused. RESPIRATORY: Lungs clear to auscultation bilaterally. Speaking easily in full sentences without audible stridor or wheezing. Equal chest rise without increased work of breathing. NEUROLOGIC: No facial asymmetry. Normal speech and language. Moving all extremities symmetrically, spontaneously. SKIN: Skin is warm, dry. PSYCHIATRIC: Mood and affect are normal. EXTREMITIES: 2+ pitting edema in lower extremities bilaterally.   Past History   PAST MEDICAL HISTORY/PAST SURGICAL HISTORY:  Past Medical History[1]  Past Surgical History[2]  MEDICATIONS:  No current facility-administered medications for this encounter. No current outpatient medications on file.  ALLERGIES:  Patient has no known allergies.  SOCIAL HISTORY:  Social  History   Tobacco Use   Smoking status: Not on file   Smokeless tobacco: Not on file  Substance Use Topics   Alcohol use: Not on file    FAMILY HISTORY: Family History[3]    Radiology   XR Chest 2 views   Final Result  No acute cardiopulmonary process             Laboratory Data   Lab Results  Component Value Date   WBC 3.2 (L) 05/04/2024   HGB 12.4 05/04/2024   HCT 36.4 05/04/2024   PLT 245 05/04/2024    Lab Results  Component Value Date   NA 145 05/04/2024   K 3.9 05/04/2024   CL 104 05/04/2024   CO2 27.7 05/04/2024   BUN 10 05/04/2024   CREATININE 0.46 (L) 05/04/2024   GLU 98 05/04/2024   CALCIUM  9.0 05/04/2024    Lab Results  Component Value Date   BILITOT 0.6 05/04/2024   PROT 6.6 05/04/2024   ALBUMIN 3.2 (L) 05/04/2024   ALT <7 (L) 05/04/2024   AST 18 05/04/2024   ALKPHOS 61 05/04/2024    Lab Results  Component Value Date   INR 1.00 05/04/2024    Bernardino Able, DO [employer]   Emergency Medicine, PGY3  Portions of this record have been created using Dragon dictation software. Dictation errors have been sought, but may not have been identified and corrected.        [1] No past medical history on file. [2] No past surgical history on file. [3] History reviewed. No pertinent family history.  Able Bernardino PARAS, DO Resident 05/04/24 [phone]

## 2024-05-04 NOTE — Telephone Encounter (Signed)
 FYI Only or Action Required?: FYI only for provider: appointment scheduled on 05/05/24.  Patient was last seen in primary care on 03/16/2024 by Bernardo Fend, DO.  Called Nurse Triage reporting Leg Swelling.  Symptoms began a week ago.  Interventions attempted: Other: Seen in [facility], Advised to see PCP.  Symptoms are: stable.  Triage Disposition: See Physician Within 24 Hours  Patient/caregiver understands and will follow disposition?:  Reason for Disposition  [1] MODERATE leg swelling (e.g., swelling extends up to knees) AND [2] new-onset or getting worse  Answer Assessment - Initial Assessment Questions Went to [facility] last night, provider stated it had nothing to do with ortho and is potentially an issue with kidneys, heart or blood clot and to see PCP. Patient needs a total knee replacement on the right and is planned to take place around March.   1. ONSET: When did the swelling start? (e.g., minutes, hours, days)     Week and a half, maybe longer  2. LOCATION: What part of the leg is swollen?  Are both legs swollen or just one leg?     Both legs, starts at knee and goes down to ankles  3. SEVERITY: How bad is the swelling? (e.g., localized; mild, moderate, severe)     Severe, the same as last night, worse not better  4. REDNESS: Is there redness or signs of infection?     Dark circles near ankles on both legs  5. PAIN: Is the swelling painful to touch? If Yes, ask: How painful is it?   (Scale 1-10; mild, moderate or severe)     Denies pain, reports tingling and feels like theres a heavy load on legs  6. FEVER: Do you have a fever? If Yes, ask: What is it, how was it measured, and when did it start?      Denies  7. CAUSE: What do you think is causing the leg swelling?     Unsure  8. MEDICAL HISTORY: Do you have a history of blood clots (e.g., DVT), cancer, heart failure, kidney disease, or liver failure?     Denies  9. RECURRENT  SYMPTOM: Have you had leg swelling before? If Yes, ask: When was the last time? What happened that time?     Denies  10. OTHER SYMPTOMS: Do you have any other symptoms? (e.g., chest pain, difficulty breathing)       Heart palpitations / flutter, reports getting prescribed Metoprolol  for it 3-4 months ago  Protocols used: Leg Swelling and Edema-A-AH  Copied from CRM #[id number]. Topic: Clinical - Red Word Triage >> May 04, 2024  8:42 AM Donna BRAVO wrote: Red Word that prompted transfer to Nurse Triage:  urgent care last night at 8:30pm -swelling in both legs -swelling is worse this morning -legs are hard to touch -half way down her legs skin is getting dark

## 2024-05-05 ENCOUNTER — Encounter: Payer: Self-pay | Admitting: Nurse Practitioner

## 2024-05-05 ENCOUNTER — Ambulatory Visit (INDEPENDENT_AMBULATORY_CARE_PROVIDER_SITE_OTHER): Admitting: Nurse Practitioner

## 2024-05-05 VITALS — BP 116/88 | HR 76 | Temp 97.5°F | Ht 66.0 in | Wt 128.0 lb

## 2024-05-05 DIAGNOSIS — R600 Localized edema: Secondary | ICD-10-CM

## 2024-05-05 MED ORDER — FUROSEMIDE 20 MG PO TABS: 20.0000 mg | ORAL_TABLET | Freq: Every day | ORAL | 0 refills | Status: DC | PRN

## 2024-05-05 NOTE — Patient Instructions (Addendum)
 [facility]-Croitoru, Jerel, MD Address: [address], [place], [place] [address] Phone: [phone]

## 2024-05-05 NOTE — ED Notes (Signed)
 [facility] Emergency Department Attestation Note      ED Clinical Impression    Final diagnoses:  Palpitations (Primary)       ED Attending Physician Teaching Attestation    I supervised care provided by the resident. We have discussed the case, I have reviewed the note and I agree with the plan of treatment except as documented in my note.       ED Attending Note    ED Triage Vitals  Enc Vitals Group     BP 05/04/24 1550 141/87     Pulse 05/04/24 1548 87     SpO2 Pulse --      Resp 05/04/24 1548 18     Temp 05/04/24 1548 36.5 C (97.7 F)     Temp Source 05/04/24 1548 Skin     SpO2 05/04/24 1548 100 %     Weight 05/04/24 1548 54.9 kg (121 lb)     Height --      Head Circumference --      Peak Flow --      Pain Score --      Pain Loc --      Pain Education --      Exclude from Growth Chart --         See chart and midlevel provider documentation for details.    Additional Medical Decision Making    I have reviewed the patient's vital signs and the nursing notes. Any pertinent labs & imaging results which were available during my care of the patient were reviewed by me.   I directly visualized and independently interpreted the EKG tracing.  and I independently visualized the radiology images.    Portions of this record have been created using Scientist, clinical (histocompatibility and immunogenetics). Dictation errors have been sought, but may not have been identified and corrected.

## 2024-05-05 NOTE — Progress Notes (Signed)
 "  BP 116/88   Pulse 76   Temp (!) 97.5 F (36.4 C)   Ht 5' 6 (1.676 m)   Wt 128 lb (58.1 kg)   SpO2 98%   BMI 20.66 kg/m    Subjective:    Patient ID: Ebony [name], female    DOB: [birth year], 73 y.o.   MRN: [id number]  HPI: Ebony [name] is a 72 y.o. female  Chief Complaint  Patient presents with   Leg Swelling    Pt c/o bilateral leg swelling x2 weeks.    Discussed the use of AI scribe software for clinical note transcription with the patient, who gave verbal consent to proceed.  History of Present Illness Ebony [name] is a 73 year old female who presents with bilateral leg swelling and recent palpitations.  Peripheral edema - Significant bilateral leg swelling for approximately two weeks - Discoloration present at the bottom of both legs - Severe impact on mobility, with difficulty walking and wearing compression socks - Leg pain associated with swelling  Palpitations - Recent episode of palpitations prompting emergency room visit yesterday - Emergency room evaluation included troponin, CBC, CMP, PT INR, and ProBNP, all within normal limits - Chest x-ray showed clear lungs and normal heart size - EKG did not reveal any abnormalities  Fatigue - Marked fatigue accompanying leg swelling  Cardiac history - Followed by a cardiologist, but no follow-up in approximately one year - Last echocardiogram performed in 2020 - Currently taking heart medication prescribed by cardiologist; specific medication and dosage not specified  Preoperative evaluation - Scheduled for right total knee replacement in March - Seeking further evaluation due to concerns about possible kidney, heart, or blood clot issues prior to surgery   Er note: Ebony [name] is a 73 year old female who presents with worsening leg swelling.   She has had brief episodes of heart palpitations since mid-year, each lasting about 10 to 12 seconds, without chest pain or shortness of breath. She takes  metoprolol . She noticed bilateral leg swelling about 1.5 weeks ago, with progressive worsening. She is not taking any diuretics. Swelling is significant enough that compression socks leave prominent indentations.   MDM    Vitals: BP 114/86   Pulse 76   Temp 36.5 C (97.7 F) (Skin)   Resp 20   Wt 54.9 kg (121 lb)   SpO2 98%     Medical Decision Making This is a 73 year old female presenting with gradually worsening bilateral lower extremity edema for approximately one and a half weeks and intermittent heart palpitations since mid-year. She denies chest pain or shortness of breath. Examination reveals 2+ pitting edema to the knees bilaterally and clear lung sounds. Laboratory studies, including serial troponins, electrolytes, and EKG, are within normal limits.   Differential diagnosis includes, but is not limited to: - Congestive heart failure exacerbation: Progressive lower extremity edema, history of heart disease, and physical findings are consistent with mild congestive heart failure exacerbation without respiratory compromise. - Acute coronary syndrome: Acute coronary syndrome is less likely given the absence of chest pain, normal EKG, and normal serial troponins.   Congestive heart failure exacerbation with heart palpitations - Advised elevating legs above heart level when sleeping. - Advised use of compression socks. - Recommended follow-up with cardiologist for discussion of repeat echocardiogram and trial of furosemide (Lasix) to manage fluid overload.  Ebony Cain, MD - 05/04/2024  Formatting of this note might be different from the original.  EXAM: XR CHEST 2  VIEWS  ACCESSION: [id number]  REPORT DATE: 05/04/2024 4:18 PM   CLINICAL INDICATION: CHEST PAIN ; Chest Pain    TECHNIQUE: PA and Lateral Chest Radiographs   COMPARISON: None   FINDINGS:   Lungs are clear. No focal consolidation. No pleural effusion or pneumothorax.   Normal heart size and mediastinal  contours. There are calcifications of the aortic knob.  . There are degenerative changes of the spine. The bones appear demineralized.    IMPRESSION:  No acute cardiopulmonary process      03/16/2024    2:43 PM 02/18/2024   11:09 AM 07/03/2023   10:52 AM  Depression screen PHQ 2/9  Decreased Interest 0 0 0  Down, Depressed, Hopeless 0 2 0  PHQ - 2 Score 0 2 0  Altered sleeping   0  Tired, decreased energy   0  Change in appetite   0  Feeling bad or failure about yourself    0  Trouble concentrating   0  Moving slowly or fidgety/restless   0  Suicidal thoughts   0  PHQ-9 Score   0   Difficult doing work/chores   Not difficult at all     Data saved with a previous flowsheet row definition    Relevant past medical, surgical, family and social history reviewed and updated as indicated. Interim medical history since our last visit reviewed. Allergies and medications reviewed and updated.  Review of Systems  Ten systems reviewed and is negative except as mentioned in HPI      Objective:      BP 116/88   Pulse 76   Temp (!) 97.5 F (36.4 C)   Ht 5' 6 (1.676 m)   Wt 128 lb (58.1 kg)   SpO2 98%   BMI 20.66 kg/m    Wt Readings from Last 3 Encounters:  05/05/24 128 lb (58.1 kg)  03/16/24 123 lb (55.8 kg)  02/18/24 128 lb 4.8 oz (58.2 kg)    Physical Exam GENERAL: Alert, cooperative, well developed, no acute distress HEENT: Normocephalic, normal oropharynx, moist mucous membranes CHEST: Clear to auscultation bilaterally, No wheezes, rhonchi, or crackles CARDIOVASCULAR: Normal heart rate and rhythm, S1 and S2 normal without murmurs ABDOMEN: Soft, non-tender, non-distended, without organomegaly, Normal bowel sounds EXTREMITIES: No cyanosis, 2 + pitting edema bilateral lower extremities NEUROLOGICAL: Cranial nerves grossly intact, Moves all extremities without gross motor or sensory deficit  Results for orders placed or performed in visit on 03/24/24  OPHTHALMOLOGY  REPORT-SCANNED   Collection Time: 03/22/24  9:26 AM  Result Value Ref Range   A Comment            Assessment & Plan:   Problem List Items Addressed This Visit   None Visit Diagnoses       Bilateral lower extremity edema    -  Primary   Relevant Medications   furosemide (LASIX) 20 MG tablet   Other Relevant Orders   Ambulatory referral to Cardiology        Assessment and Plan Assessment & Plan Bilateral lower extremity edema Approximately two weeks with associated brown discoloration at the bottom of the legs. Recent ER visit showed normal chest x-ray, EKG, and labs, ruling out acute cardiopulmonary issues. Differential diagnosis includes fluid retention, possibly cardiac-related, given her history and lack of recent cardiology follow-up. Last echocardiogram was in 2020, and last cardiology visit was in December 2024. - Prescribed diuretic as needed for swelling, once daily if necessary. - Referred to cardiologist for further evaluation  and possible repeat echocardiogram. - Advised use of compression socks.        Follow up plan: Return if symptoms worsen or fail to improve. "

## 2024-05-19 NOTE — Telephone Encounter (Unsigned)
 Copied from CRM [id number]. Topic: Referral - Status >> May 19, 2024 10:12 AM Ebony [name] wrote: Reason for CRM: Pt is calling in to follow up on referral to cardiologist. Pt states in would be the one in [place] with [facility]. Please call pt on #[phone]

## 2024-05-19 NOTE — Addendum Note (Signed)
 Addended by: YVONE WARREN BROCKS on: 05/19/2024 10:57 AM   Modules accepted: Orders

## 2024-05-26 ENCOUNTER — Ambulatory Visit: Payer: Self-pay

## 2024-05-26 ENCOUNTER — Telehealth: Payer: Self-pay | Admitting: Internal Medicine

## 2024-05-26 NOTE — Telephone Encounter (Signed)
 Pharmacy requesting refill for 90 day  with 11 refills on medication pantoprazole  (PROTONIX ) 40 MG tablet

## 2024-05-26 NOTE — Telephone Encounter (Signed)
 FYI Only or Action Required?: Action required by provider: update on patient condition.  Patient was last seen in primary care on 05/05/2024 by Gareth Mliss FALCON, FNP.  Called Nurse Triage reporting Leg Swelling.  Symptoms began several weeks ago.  Interventions attempted: Prescription medications: furosemide .  Symptoms are: unchanged.  Triage Disposition: No disposition on file.  Patient/caregiver understands and will follow disposition?:   Message from Gilbert E sent at 05/26/2024  1:29 PM EST  Reason for Triage: Swelling, discoloration/turning dark, legs/ankles   Answer Assessment - Initial Assessment Questions Pt was given referral to Cardio but states never got a call, referral sent 05/05/24, advised pt to call their office, confirmed address and phone #. Pt going to call and will CB if unable to get soon appt.   1. ONSET: When did the swelling start? (e.g., minutes, hours, days)     Ongoing several weeks  2. LOCATION: What part of the leg is swollen?  Are both legs swollen or just one leg?     Bilat legs 3. SEVERITY: How bad is the swelling? (e.g., localized; mild, moderate, severe)     mild 4. REDNESS: Is there redness or signs of infection?     no 5. PAIN: Is the swelling painful to touch? If Yes, ask: How painful is it?   (Scale 1-10; mild, moderate or severe)      10. OTHER SYMPTOMS: Do you have any other symptoms? (e.g., chest pain, difficulty breathing)       Discoloration lower leg brown  Protocols used: Leg Swelling and Edema-A-AH

## 2024-05-27 ENCOUNTER — Other Ambulatory Visit: Payer: Self-pay | Admitting: Emergency Medicine

## 2024-05-27 DIAGNOSIS — K219 Gastro-esophageal reflux disease without esophagitis: Secondary | ICD-10-CM

## 2024-05-27 MED ORDER — PANTOPRAZOLE SODIUM 40 MG PO TBEC: 40.0000 mg | DELAYED_RELEASE_TABLET | Freq: Every day | ORAL | 1 refills | Status: AC | PRN

## 2024-05-27 MED ORDER — PANTOPRAZOLE SODIUM 40 MG PO TBEC: 40.0000 mg | DELAYED_RELEASE_TABLET | Freq: Every day | ORAL | 1 refills | Status: DC | PRN

## 2024-05-27 NOTE — Telephone Encounter (Signed)
 Called patient, mailbox not taking messages

## 2024-05-27 NOTE — Telephone Encounter (Signed)
 Presscription sent to mail order pharmacy. I called [facility] and canceled out script that was sent there

## 2024-06-01 ENCOUNTER — Other Ambulatory Visit: Payer: Self-pay | Admitting: Nurse Practitioner

## 2024-06-01 DIAGNOSIS — R600 Localized edema: Secondary | ICD-10-CM

## 2024-06-01 NOTE — Telephone Encounter (Signed)
 Requested medications are due for refill today.  yes  Requested medications are on the active medications list.  yes  Last refill. 05/05/2024 #30 0 rf  Future visit scheduled.   yes  Notes to clinic.  New medication to this pt.     Requested Prescriptions  Pending Prescriptions Disp Refills   furosemide  (LASIX ) 20 MG tablet [Pharmacy Med Name: FUROSEMIDE  20MG  TABLETS] 30 tablet 0    Sig: TAKE 1 TABLET(20 MG) BY MOUTH DAILY AS NEEDED FOR FLUID RETENTION OR SWELLING     Cardiovascular:  Diuretics - Loop Failed - 06/01/2024  5:38 PM      Failed - Cr in normal range and within 180 days    Creat  Date Value Ref Range Status  01/27/2024 0.37 (L) 0.60 - 1.00 mg/dL Final   Creatinine, Urine  Date Value Ref Range Status  12/31/2023 126 20 - 275 mg/dL Final         Failed - Mg Level in normal range and within 180 days    Magnesium  Date Value Ref Range Status  12/31/2022 2.3 1.7 - 2.4 mg/dL Final    Comment:    Performed at [facility], [address] [place], [place] [address]         Passed - K in normal range and within 180 days    Potassium  Date Value Ref Range Status  01/27/2024 3.9 3.5 - 5.3 mmol/L Final         Passed - Ca in normal range and within 180 days    Calcium   Date Value Ref Range Status  01/27/2024 8.6 8.6 - 10.4 mg/dL Final         Passed - Na in normal range and within 180 days    Sodium  Date Value Ref Range Status  01/27/2024 142 135 - 146 mmol/L Final  07/11/2023 143 134 - 144 mmol/L Final         Passed - Cl in normal range and within 180 days    Chloride  Date Value Ref Range Status  01/27/2024 106 98 - 110 mmol/L Final         Passed - Last BP in normal range    BP Readings from Last 1 Encounters:  05/05/24 116/88         Passed - Valid encounter within last 6 months    Recent Outpatient Visits           3 weeks ago Bilateral lower extremity edema   [facility] Gareth Mliss FALCON, FNP   2  months ago Type 2 diabetes mellitus with hyperglycemia, without long-term current use of insulin [facility])   [facility] Bernardo Fend, DO   4 months ago Osteoporosis without current pathological fracture, unspecified osteoporosis type   [facility] Leavy Mole, PA-C   5 months ago Bilateral lower extremity edema   [facility] Leavy Mole, PA-C   5 months ago Weight loss, unintentional   [facility] Leavy Mole, PA-C       Future Appointments             In 3 weeks Lucien Orren SAILOR, PA-C [facility] [employer] [facility] [employer]. [facility] [employer]

## 2024-06-02 NOTE — Telephone Encounter (Signed)
 Pt is schededuled

## 2024-06-02 NOTE — Telephone Encounter (Signed)
 Needs follow up appt scheduled around 06/16/24

## 2024-06-14 ENCOUNTER — Ambulatory Visit: Admitting: Internal Medicine

## 2024-06-18 ENCOUNTER — Ambulatory Visit: Admitting: Internal Medicine

## 2024-06-18 ENCOUNTER — Ambulatory Visit: Admitting: Family Medicine

## 2024-06-28 ENCOUNTER — Ambulatory Visit: Admitting: Physician Assistant

## 2024-07-08 ENCOUNTER — Ambulatory Visit: Payer: Medicare HMO
